# Patient Record
Sex: Male | Born: 1950 | Race: White | Hispanic: Refuse to answer | Marital: Married | Smoking: Current every day smoker
Health system: Southern US, Community
[De-identification: ages and names within clinical notes are randomized; demographics above are authoritative.]

## PROBLEM LIST (undated history)

## (undated) DIAGNOSIS — M79609 Pain in unspecified limb: Secondary | ICD-10-CM

## (undated) DIAGNOSIS — R739 Hyperglycemia, unspecified: Secondary | ICD-10-CM

## (undated) DIAGNOSIS — N200 Calculus of kidney: Secondary | ICD-10-CM

## (undated) DIAGNOSIS — E119 Type 2 diabetes mellitus without complications: Secondary | ICD-10-CM

## (undated) DIAGNOSIS — I10 Essential (primary) hypertension: Secondary | ICD-10-CM

## (undated) DIAGNOSIS — R030 Elevated blood-pressure reading, without diagnosis of hypertension: Secondary | ICD-10-CM

## (undated) DIAGNOSIS — M1990 Unspecified osteoarthritis, unspecified site: Secondary | ICD-10-CM

## (undated) DIAGNOSIS — M87051 Idiopathic aseptic necrosis of right femur: Secondary | ICD-10-CM

## (undated) DIAGNOSIS — B019 Varicella without complication: Secondary | ICD-10-CM

## (undated) DIAGNOSIS — E785 Hyperlipidemia, unspecified: Secondary | ICD-10-CM

## (undated) DIAGNOSIS — Z96641 Presence of right artificial hip joint: Secondary | ICD-10-CM

## (undated) HISTORY — DX: Calculus of kidney: N20.0

## (undated) HISTORY — DX: Elevated blood-pressure reading, without diagnosis of hypertension: R03.0

## (undated) HISTORY — DX: Type 2 diabetes mellitus without complications: E11.9

## (undated) HISTORY — DX: Presence of right artificial hip joint: Z96.641

## (undated) HISTORY — DX: Idiopathic aseptic necrosis of right femur: M87.051

## (undated) HISTORY — DX: Hyperglycemia, unspecified: R73.9

## (undated) HISTORY — DX: Pain in unspecified limb: M79.609

## (undated) HISTORY — DX: Hyperlipidemia, unspecified: E78.5

## (undated) HISTORY — DX: Essential (primary) hypertension: I10

## (undated) HISTORY — DX: Varicella without complication: B01.9

---

## 1998-12-29 ENCOUNTER — Encounter: Payer: Self-pay | Admitting: *Deleted

## 1998-12-29 ENCOUNTER — Emergency Department (HOSPITAL_COMMUNITY): Admission: EM | Admit: 1998-12-29 | Discharge: 1998-12-29 | Payer: Self-pay | Admitting: *Deleted

## 2012-05-20 ENCOUNTER — Encounter: Payer: Self-pay | Admitting: Family Medicine

## 2012-05-20 ENCOUNTER — Ambulatory Visit (INDEPENDENT_AMBULATORY_CARE_PROVIDER_SITE_OTHER): Payer: 59 | Admitting: Family Medicine

## 2012-05-20 VITALS — BP 142/80 | HR 97 | Temp 98.6°F | Ht 69.0 in | Wt 203.0 lb

## 2012-05-20 DIAGNOSIS — F529 Unspecified sexual dysfunction not due to a substance or known physiological condition: Secondary | ICD-10-CM

## 2012-05-20 DIAGNOSIS — Z1211 Encounter for screening for malignant neoplasm of colon: Secondary | ICD-10-CM

## 2012-05-20 DIAGNOSIS — Z13220 Encounter for screening for lipoid disorders: Secondary | ICD-10-CM

## 2012-05-20 DIAGNOSIS — R37 Sexual dysfunction, unspecified: Secondary | ICD-10-CM

## 2012-05-20 DIAGNOSIS — R030 Elevated blood-pressure reading, without diagnosis of hypertension: Secondary | ICD-10-CM

## 2012-05-20 DIAGNOSIS — Z131 Encounter for screening for diabetes mellitus: Secondary | ICD-10-CM

## 2012-05-20 NOTE — Progress Notes (Signed)
Chief Complaint  Patient presents with  . Establish Care    HPI:  Adam Perkins is here to establish care. Has not had a PCP in a long time. Recently wife has suggested he get established with a primary care doctor. Last PCP and physical: has been a long time since seen Work: starting a company - food business Home Situation: lives with wife Spiritual Beliefs:  Exercise/Diet: bunion on foot inhibiting walking/working on diet    Has the following chronic problems and concerns today:  Sexual dysfunction: -started after wife's mother in-law passed away - lots of stress and wife had no desire for some time -libido seems good, erections sometimes great, erections good other times or sometimes not good when stressed -issues with wife in terms of psychological issues after above event -has a lot of stress at home, wife wants him to have testosterone checked  Other Providers: -Dr. Darel Hong, optho, has a little elevated ocular pressure   Health Maintenance: -colon: wants colonoscopy -prostate cancer screening: refused after discussion, will think about it -Vaccines: refused  ROS: See pertinent positives and negatives per HPI.   Past Medical History  Diagnosis Date  . Chicken pox   . Kidney stone     nephrolithiasis    Family History  Problem Relation Age of Onset  . Arthritis Father   . Cancer Father 33    bladder cancer  . Diabetes Mother     History   Social History  . Marital Status: Married    Spouse Name: N/A    Number of Children: N/A  . Years of Education: N/A   Social History Main Topics  . Smoking status: Former Smoker    Quit date: 05/21/2011  . Smokeless tobacco: None     Comment: quit 2012 after smoking on and off a little for years  . Alcohol Use: Yes     Comment: under safe drinking limits  . Drug Use: None  . Sexually Active: None   Other Topics Concern  . None   Social History Narrative  . None    Current outpatient prescriptions:timolol  (BETIMOL) 0.25 % ophthalmic solution, Place 1 drop into both eyes 2 (two) times daily., Disp: , Rfl:   EXAM:  Filed Vitals:   05/20/12 1425  BP: 142/80  Pulse: 97  Temp: 98.6 F (37 C)   BP great on recheck 128/78 Body mass index is 29.98 kg/(m^2).  GENERAL: vitals reviewed and listed above, alert, oriented, appears well hydrated and in no acute distress  HEENT: atraumatic, conjunttiva clear, no obvious abnormalities on inspection of external nose and ears  NECK: no obvious masses on inspection  LUNGS: clear to auscultation bilaterally, no wheezes, rales or rhonchi, good air movement  CV: HRRR, no peripheral edema  MS: moves all extremities without noticeable abnormality  PSYCH: pleasant and cooperative, no obvious depression or anxiety  ASSESSMENT AND PLAN:  Discussed the following assessment and plan:  1. Sexual dysfunction  Testosterone, free, total  2. Screening for diabetes mellitus  Hemoglobin A1c  3. Screening for hyperlipidemia  Lipid Panel  4. Colon cancer screening  Ambulatory referral to Gastroenterology  5. Elevated blood pressure  Basic metabolic panel   -We reviewed the PMH, PSH, FH, SH, Meds and Allergies. -We provided refills for any medications we will prescribe as needed. -We addressed current concerns per orders and patient instructions. -We have asked for records for pertinent exams, studies, vaccines and notes from previous providers. -We have advised patient to  follow up per instructions below. -Influenza vaccine given today  -Patient advised to return or notify a doctor immediately if symptoms worsen or persist or new concerns arise.  Patient Instructions  -We have ordered labs or studies at this visit. It can take up to 1-2 weeks for results and processing. We will contact you with instructions IF your results are abnormal. Normal results will be released to your [facility]. If you have not heard from [place] or can not find your results in [facility] in 2  weeks please contact our office.  -PLEASE SIGN UP FOR MYCHART TODAY   We recommend the following healthy lifestyle measures: - eat a healthy diet consisting of lots of vegetables, fruits, beans, nuts, seeds, healthy meats such as white chicken and fish and whole grains.  - avoid fried foods, fast food, processed foods, sodas, red meet and other fattening foods.  - get a least 150 minutes of aerobic exercise per week.   Follow up in: 3 months      Louine Tenpenny R.

## 2012-05-20 NOTE — Patient Instructions (Addendum)
-  We have ordered labs or studies at this visit. It can take up to 1-2 weeks for results and processing. We will contact you with instructions IF your results are abnormal. Normal results will be released to your MYCHART. If you have not heard from us or can not find your results in MYCHART in 2 weeks please contact our office.  -PLEASE SIGN UP FOR MYCHART TODAY   We recommend the following healthy lifestyle measures: - eat a healthy diet consisting of lots of vegetables, fruits, beans, nuts, seeds, healthy meats such as white chicken and fish and whole grains.  - avoid fried foods, fast food, processed foods, sodas, red meet and other fattening foods.  - get a least 150 minutes of aerobic exercise per week.   Follow up in: 3 months  

## 2012-06-07 ENCOUNTER — Other Ambulatory Visit: Payer: 59

## 2012-06-07 DIAGNOSIS — Z13220 Encounter for screening for lipoid disorders: Secondary | ICD-10-CM

## 2012-06-07 DIAGNOSIS — R37 Sexual dysfunction, unspecified: Secondary | ICD-10-CM

## 2012-06-07 DIAGNOSIS — Z131 Encounter for screening for diabetes mellitus: Secondary | ICD-10-CM

## 2012-06-07 LAB — LIPID PANEL
Cholesterol: 275 mg/dL — ABNORMAL HIGH (ref 0–200)
HDL: 43.3 mg/dL (ref >39.00)
Total CHOL/HDL Ratio: 6
Triglycerides: 186 mg/dL — ABNORMAL HIGH (ref 0.0–149.0)
VLDL: 37.2 mg/dL (ref 0.0–40.0)

## 2012-06-07 LAB — BASIC METABOLIC PANEL
BUN: 16 mg/dL (ref 6–23)
CO2: 24 mEq/L (ref 19–32)
Calcium: 9.1 mg/dL (ref 8.4–10.5)
Chloride: 103 mEq/L (ref 96–112)
Creatinine, Ser: 0.8 mg/dL (ref 0.4–1.5)
GFR: 105.87 mL/min (ref >60.00)
Glucose, Bld: 142 mg/dL — ABNORMAL HIGH (ref 70–99)
Potassium: 3.9 mEq/L (ref 3.5–5.1)
Sodium: 137 mEq/L (ref 135–145)

## 2012-06-07 LAB — HEMOGLOBIN A1C: Hgb A1c MFr Bld: 6.5 % (ref 4.6–6.5)

## 2012-06-07 LAB — LDL CHOLESTEROL, DIRECT: Direct LDL: 210.2 mg/dL

## 2012-06-10 ENCOUNTER — Telehealth: Payer: Self-pay | Admitting: Family Medicine

## 2012-06-10 LAB — TESTOSTERONE, FREE, TOTAL, SHBG
Sex Hormone Binding: 24 nmol/L (ref 13–71)
Testosterone, Free: 77.2 pg/mL (ref 47.0–244.0)
Testosterone-% Free: 2.4 % (ref 1.6–2.9)
Testosterone: 327.28 ng/dL (ref 300–890)

## 2012-06-10 NOTE — Telephone Encounter (Signed)
Please let patient know, since not yet signed up for mychart, wanted to contact regarding lab results. Recommend signing up for mychart to see these results in about 10 days.  -cholesterol and diabetes screening labs elevated  The best treatment to hopefully reverse these findings and prevent adverse health outcomes is a healthy diet and regular exercise.   Needs appointment with me in 1-2 months to recheck and discuss treatment options.

## 2012-06-11 NOTE — Telephone Encounter (Signed)
Left a message for pt to return call 

## 2012-06-13 NOTE — Telephone Encounter (Signed)
Patient is aware of lab results and an appointment made. 

## 2012-08-19 ENCOUNTER — Ambulatory Visit (INDEPENDENT_AMBULATORY_CARE_PROVIDER_SITE_OTHER): Payer: 59 | Admitting: Family Medicine

## 2012-08-19 ENCOUNTER — Encounter: Payer: Self-pay | Admitting: Family Medicine

## 2012-08-19 VITALS — BP 168/98 | HR 68 | Temp 98.1°F | Wt 201.0 lb

## 2012-08-19 DIAGNOSIS — E785 Hyperlipidemia, unspecified: Secondary | ICD-10-CM

## 2012-08-19 DIAGNOSIS — E119 Type 2 diabetes mellitus without complications: Secondary | ICD-10-CM

## 2012-08-19 DIAGNOSIS — IMO0001 Reserved for inherently not codable concepts without codable children: Secondary | ICD-10-CM

## 2012-08-19 DIAGNOSIS — I10 Essential (primary) hypertension: Secondary | ICD-10-CM | POA: Insufficient documentation

## 2012-08-19 DIAGNOSIS — R030 Elevated blood-pressure reading, without diagnosis of hypertension: Secondary | ICD-10-CM

## 2012-08-19 HISTORY — DX: Reserved for inherently not codable concepts without codable children: IMO0001

## 2012-08-19 HISTORY — DX: Hyperlipidemia, unspecified: E78.5

## 2012-08-19 LAB — HEMOGLOBIN A1C: Hgb A1c MFr Bld: 6.1 % (ref 4.6–6.5)

## 2012-08-19 LAB — LIPID PANEL
Cholesterol: 250 mg/dL — ABNORMAL HIGH (ref 0–200)
HDL: 39.3 mg/dL (ref >39.00)
Total CHOL/HDL Ratio: 6
Triglycerides: 152 mg/dL — ABNORMAL HIGH (ref 0.0–149.0)
VLDL: 30.4 mg/dL (ref 0.0–40.0)

## 2012-08-19 NOTE — Progress Notes (Signed)
Chief Complaint  Patient presents with  . Follow-up    HPI:  Diabetes: -new dx on labs 05/2012  Lab Results  Component Value Date   HGBA1C 6.5 06/07/2012  -advised lifestyle changes and close follow up -diet and exercise: has made some changes with his diet - cutting at fats in diet and eating much healthier, trying to do some exercise  HLD: Lab Results  Component Value Date   CHOL 275* 06/07/2012   HDL 43.30 06/07/2012   LDLDIRECT 210.2 06/07/2012   TRIG 186.0* 06/07/2012   CHOLHDL 6 06/07/2012    ROS: See pertinent positives and negatives per HPI.  Past Medical History  Diagnosis Date  . Chicken pox   . Kidney stone     nephrolithiasis    Family History  Problem Relation Age of Onset  . Arthritis Father   . Cancer Father 68    bladder cancer  . Diabetes Mother     History   Social History  . Marital Status: Married    Spouse Name: N/A    Number of Children: N/A  . Years of Education: N/A   Social History Main Topics  . Smoking status: Former Smoker    Quit date: 05/21/2011  . Smokeless tobacco: None     Comment: quit 2012 after smoking on and off a little for years  . Alcohol Use: Yes     Comment: under safe drinking limits  . Drug Use: None  . Sexually Active: None   Other Topics Concern  . None   Social History Narrative  . None    Current outpatient prescriptions:timolol (BETIMOL) 0.25 % ophthalmic solution, Place 1 drop into both eyes 2 (two) times daily., Disp: , Rfl:   EXAM:  Filed Vitals:   08/19/12 1029  BP: 168/98  Pulse: 68  Temp: 98.1 F (36.7 C)    Body mass index is 29.67 kg/(m^2).  GENERAL: vitals reviewed and listed above, alert, oriented, appears well hydrated and in no acute distress  HEENT: atraumatic, conjunttiva clear, no obvious abnormalities on inspection of external nose and ears  NECK: no obvious masses on inspection  LUNGS: clear to auscultation bilaterally, no wheezes, rales or rhonchi, good air  movement  CV: HRRR, no peripheral edema  MS: moves all extremities without noticeable abnormality  PSYCH: pleasant and cooperative, no obvious depression or anxiety  ASSESSMENT AND PLAN:  Discussed the following assessment and plan:  Diabetes - Plan: Hemoglobin A1c  Hyperlipemia - Plan: Lipid Panel  Elevated blood pressure  -discussed options for management diabetes and HLD -will recheck labs - if needed start metformin (risks discussed) at low dose - does not want to start medication at this time -pt provided with meter and instructed on use -offered referral to diabetes educator - he does not want this today -advised diet and exercise - he is going to work on this -advised adding statin - risks discussed -BP 136/88 on recheck - advised to bring cuff and home log next appointment -follow up in 3 months -Patient advised to return or notify a doctor immediately if symptoms worsen or persist or new concerns arise.   Kriste Basque R.

## 2012-08-20 ENCOUNTER — Telehealth: Payer: Self-pay | Admitting: Family Medicine

## 2012-08-20 DIAGNOSIS — E785 Hyperlipidemia, unspecified: Secondary | ICD-10-CM

## 2012-08-20 MED ORDER — PRAVASTATIN SODIUM 40 MG PO TABS: 40.0000 mg | ORAL_TABLET | Freq: Every day | ORAL | Status: DC

## 2012-08-20 NOTE — Telephone Encounter (Signed)
Please let him know:  Labs have improved. I do not think we need a medication for the diabetes at this time as long as he keeps up the good work with eating healthy and exercising.  Cholesterol is improved a little, but still very high and I would recommend starting the pravastatin as we discussed. Sent Rx to the [facility]. This is to be taken once daily. Advise appointment in 3 months and will recheck then - please help him schedule this.

## 2012-08-21 NOTE — Telephone Encounter (Signed)
Called and spoke with pt and pt is aware.  

## 2012-08-21 NOTE — Telephone Encounter (Signed)
Left a message for pt to return call 

## 2012-11-21 ENCOUNTER — Encounter: Payer: Self-pay | Admitting: Family Medicine

## 2012-11-21 ENCOUNTER — Ambulatory Visit (INDEPENDENT_AMBULATORY_CARE_PROVIDER_SITE_OTHER): Payer: 59 | Admitting: Family Medicine

## 2012-11-21 VITALS — BP 140/80 | Temp 98.2°F | Wt 196.0 lb

## 2012-11-21 DIAGNOSIS — E119 Type 2 diabetes mellitus without complications: Secondary | ICD-10-CM

## 2012-11-21 DIAGNOSIS — M5430 Sciatica, unspecified side: Secondary | ICD-10-CM

## 2012-11-21 DIAGNOSIS — M25551 Pain in right hip: Secondary | ICD-10-CM

## 2012-11-21 DIAGNOSIS — R030 Elevated blood-pressure reading, without diagnosis of hypertension: Secondary | ICD-10-CM

## 2012-11-21 DIAGNOSIS — E785 Hyperlipidemia, unspecified: Secondary | ICD-10-CM

## 2012-11-21 DIAGNOSIS — M5431 Sciatica, right side: Secondary | ICD-10-CM

## 2012-11-21 DIAGNOSIS — M25559 Pain in unspecified hip: Secondary | ICD-10-CM

## 2012-11-21 MED ORDER — PRAVASTATIN SODIUM 40 MG PO TABS: 40.0000 mg | ORAL_TABLET | Freq: Every day | ORAL | Status: DC

## 2012-11-21 NOTE — Progress Notes (Signed)
Chief Complaint  Patient presents with  . Follow-up    cholesterol     HPI:  ROS: See pertinent positives and negatives per HPI.  Follow up:  1) Diabetes - diet controlled -lifestyle: walking, lifting and stretching daily -feels good Lab Results  Component Value Date   HGBA1C 6.1 08/19/2012   2) HLD: Lab Results  Component Value Date   CHOL 250* 08/19/2012   HDL 39.30 08/19/2012   LDLDIRECT 192.4 08/19/2012   TRIG 152.0* 08/19/2012   CHOLHDL 6 08/19/2012  -did not start statin - wanted to discuss first worried about leg pains with statins  3)Hx elevated BP: -tx with lifestyle changes, advised to bring cuff and home log this appt -denies:  4)on going intermittent R hip pain -on and off for many years -told sciatica and bursitis in the past -wants to see specialist -denies: weakness, numbness, bowel - bladder function   Past Medical History  Diagnosis Date  . Chicken pox   . Kidney stone     nephrolithiasis  . Diabetes 08/19/2012  . Hyperlipemia 08/19/2012  . Elevated blood pressure 08/19/2012    Family History  Problem Relation Age of Onset  . Arthritis Father   . Cancer Father 22    bladder cancer  . Diabetes Mother     History   Social History  . Marital Status: Married    Spouse Name: N/A    Number of Children: N/A  . Years of Education: N/A   Social History Main Topics  . Smoking status: Former Smoker    Quit date: 05/21/2011  . Smokeless tobacco: None     Comment: quit 2012 after smoking on and off a little for years  . Alcohol Use: Yes     Comment: under safe drinking limits  . Drug Use: None  . Sexually Active: None   Other Topics Concern  . None   Social History Narrative  . None    Current outpatient prescriptions:timolol (BETIMOL) 0.25 % ophthalmic solution, Place 1 drop into both eyes 2 (two) times daily., Disp: , Rfl: ;  pravastatin (PRAVACHOL) 40 MG tablet, Take 1 tablet (40 mg total) by mouth daily., Disp: 90 tablet, Rfl:  3  EXAM:  Filed Vitals:   11/21/12 1004  BP: 140/80  Temp: 98.2 F (36.8 C)    Body mass index is 28.93 kg/(m^2).  GENERAL: vitals reviewed and listed above, alert, oriented, appears well hydrated and in no acute distress  HEENT: atraumatic, conjunttiva clear, no obvious abnormalities on inspection of external nose and ears  NECK: no obvious masses on inspection  LUNGS: clear to auscultation bilaterally, no wheezes, rales or rhonchi, good air movement  CV: HRRR, no peripheral edema  MS: moves all extremities without noticeable abnormality  PSYCH: pleasant and cooperative, no obvious depression or anxiety  ASSESSMENT AND PLAN:  Discussed the following assessment and plan:  Diabetes  Hyperlipemia - Plan: pravastatin (PRAVACHOL) 40 MG tablet  Elevated blood pressure -LABS today -follow up in 3-5 months for CPE -Patient advised to return or notify a doctor immediately if symptoms worsen or persist or new concerns arise.  There are no Patient Instructions on file for this visit.   Kriste Basque R.

## 2012-11-21 NOTE — Patient Instructions (Signed)
-  start the pravastatin -As we discussed, we have prescribed a new medication (pravastatin) for you at this appointment. We discussed the common and serious potential adverse effects of this medication and you can review these and more with the pharmacist when you pick up your medication.  Please follow the instructions for use carefully and notify us immediately if you have any problems taking this medication.  -continue to work on diet and exercise  -home exercises for your hip  -We placed a referral for you as discussed. It usually takes about 1-2 weeks to process and schedule this referral. If you have not heard from [place] regarding this appointment in 2 weeks please contact our office.   -follow up for early morning appointment in 3 months and will check labs then

## 2012-12-04 ENCOUNTER — Encounter: Payer: Self-pay | Admitting: Physical Medicine & Rehabilitation

## 2012-12-13 ENCOUNTER — Encounter: Payer: Self-pay | Admitting: Physical Medicine & Rehabilitation

## 2012-12-13 ENCOUNTER — Encounter: Payer: 59 | Attending: Physical Medicine & Rehabilitation

## 2012-12-13 ENCOUNTER — Ambulatory Visit (HOSPITAL_BASED_OUTPATIENT_CLINIC_OR_DEPARTMENT_OTHER): Payer: 59 | Admitting: Physical Medicine & Rehabilitation

## 2012-12-13 VITALS — BP 162/96 | HR 60 | Resp 14 | Ht 69.0 in | Wt 196.0 lb

## 2012-12-13 DIAGNOSIS — M76899 Other specified enthesopathies of unspecified lower limb, excluding foot: Secondary | ICD-10-CM

## 2012-12-13 DIAGNOSIS — M7061 Trochanteric bursitis, right hip: Secondary | ICD-10-CM

## 2012-12-13 DIAGNOSIS — E785 Hyperlipidemia, unspecified: Secondary | ICD-10-CM | POA: Insufficient documentation

## 2012-12-13 DIAGNOSIS — G5700 Lesion of sciatic nerve, unspecified lower limb: Secondary | ICD-10-CM | POA: Insufficient documentation

## 2012-12-13 DIAGNOSIS — E119 Type 2 diabetes mellitus without complications: Secondary | ICD-10-CM | POA: Insufficient documentation

## 2012-12-13 DIAGNOSIS — G5701 Lesion of sciatic nerve, right lower limb: Secondary | ICD-10-CM

## 2012-12-13 MED ORDER — IBUPROFEN 800 MG PO TABS: 800.0000 mg | ORAL_TABLET | Freq: Three times a day (TID) | ORAL | Status: DC | PRN

## 2012-12-13 NOTE — Progress Notes (Signed)
Subjective:    Patient ID: Adam Perkins, male    DOB: [birth year], 62 y.o.   MRN: [id number]  HPI CC R hip and R sciatic pain This has been going on in off for many months. It worsened since the winter of 2014. R hip worsens with prolonged walking Sciatic type pain increased with lifting and ladder use, the sciatic pain is described as originating from the buttocks and radiating into the thigh on the right side  No history of trauma. No history of back surgery. No history of joint replacement surgery. Has tried over-the-counter medications when he gets "worse". No trial injections or physical therapy  Semi retired Pain Inventory Average Pain 6 Pain Right Now 4 My pain is aching  In the last 24 hours, has pain interfered with the following? General activity 6 Relation with others 0 Enjoyment of life 5 What TIME of day is your pain at its worst? evening Sleep (in general) Fair  Pain is worse with: walking and some activites Pain improves with: unsure Relief from Meds: no pain med  Mobility walk without assistance ability to climb steps?  yes do you drive?  yes  Function employed # of hrs/week .  Neuro/Psych No problems in this area  Prior Studies Any changes since last visit?  no  Physicians involved in your care Any changes since last visit?  no   Family History  Problem Relation Age of Onset  . Arthritis Father   . Cancer Father 38    bladder cancer  . Diabetes Mother    History   Social History  . Marital Status: Married    Spouse Name: N/A    Number of Children: N/A  . Years of Education: N/A   Social History Main Topics  . Smoking status: Former Smoker    Quit date: 05/21/2011  . Smokeless tobacco: Never Used     Comment: quit 2012 after smoking on and off a little for years  . Alcohol Use: Yes     Comment: under safe drinking limits  . Drug Use: None  . Sexually Active: None   Other Topics Concern  . None   Social History Narrative  .  None   History reviewed. No pertinent past surgical history. Past Medical History  Diagnosis Date  . Chicken pox   . Kidney stone     nephrolithiasis  . Diabetes 08/19/2012  . Hyperlipemia 08/19/2012  . Elevated blood pressure 08/19/2012   BP 162/96  Pulse 60  Resp 14  Ht 5\' 9"  (1.753 m)  Wt 196 lb (88.905 kg)  BMI 28.93 kg/m2  SpO2 99%    Review of Systems  Musculoskeletal: Positive for gait problem.       Hip pain and sciatica  All other systems reviewed and are negative.       Objective:   Physical Exam  Nursing note and vitals reviewed. Constitutional: He is oriented to person, place, and time. He appears well-developed and well-nourished.  HENT:  Head: Normocephalic and atraumatic.  Eyes: Conjunctivae and EOM are normal.  Neck: Normal range of motion.  Musculoskeletal:       Right hip: He exhibits decreased range of motion and tenderness.  Neurological: He is alert and oriented to person, place, and time. He has normal reflexes.  Psychiatric: He has a normal mood and affect.   Negative straight leg raising test Negative femoral stretch test Tenderness over the right gluteus area Tenderness over the right greater trochanter of the  hip Right lateral hip pain with internal rotation passively of the right hip. Ambulation is normal       Assessment & Plan:  1. Right trochanteric bursitis 2. Right piriformis syndrome  Will refer to physical therapy to work on stretching and strengthening the external rotators of the hip as well as the tensor fascia lata  Will start patient on ibuprofen 800 3 times a day x2 weeks  If patient fails to improve after the above regimen, I will see him back in followup and perform trochanteric bursa injection, may need hip ultrasound and hip films as well.

## 2012-12-13 NOTE — Patient Instructions (Signed)
Take ibuprofen with food.

## 2013-01-13 ENCOUNTER — Other Ambulatory Visit: Payer: Self-pay

## 2013-01-13 MED ORDER — IBUPROFEN 800 MG PO TABS: 800.0000 mg | ORAL_TABLET | Freq: Three times a day (TID) | ORAL | Status: DC | PRN

## 2013-02-21 ENCOUNTER — Ambulatory Visit: Payer: 59 | Admitting: Family Medicine

## 2013-05-20 ENCOUNTER — Ambulatory Visit: Payer: Self-pay

## 2013-06-03 ENCOUNTER — Ambulatory Visit (INDEPENDENT_AMBULATORY_CARE_PROVIDER_SITE_OTHER): Payer: 59

## 2013-06-03 VITALS — BP 163/76 | HR 86 | Resp 12 | Ht 69.0 in | Wt 192.0 lb

## 2013-06-03 DIAGNOSIS — B351 Tinea unguium: Secondary | ICD-10-CM

## 2013-06-03 DIAGNOSIS — M2020 Hallux rigidus, unspecified foot: Secondary | ICD-10-CM

## 2013-06-03 DIAGNOSIS — R52 Pain, unspecified: Secondary | ICD-10-CM

## 2013-06-03 DIAGNOSIS — B353 Tinea pedis: Secondary | ICD-10-CM

## 2013-06-03 DIAGNOSIS — M1990 Unspecified osteoarthritis, unspecified site: Secondary | ICD-10-CM

## 2013-06-03 DIAGNOSIS — M2021 Hallux rigidus, right foot: Secondary | ICD-10-CM

## 2013-06-03 MED ORDER — NAFTIFINE HCL 2 % EX CREA: TOPICAL_CREAM | CUTANEOUS | Status: DC

## 2013-06-03 NOTE — Patient Instructions (Signed)
Onychomycosis/Fungal Toenails  WHAT IS IT? An infection that lies within the keratin of your nail plate that is caused by a fungus.  WHY ME? Fungal infections affect all ages, sexes, races, and creeds.  There may be many factors that predispose you to a fungal infection such as age, coexisting medical conditions such as diabetes, or an autoimmune disease; stress, medications, fatigue, genetics, etc.  Bottom line: fungus thrives in a warm, moist environment and your shoes offer such a location.  IS IT CONTAGIOUS? Theoretically, yes.  You do not want to share shoes, nail clippers or files with someone who has fungal toenails.  Walking around barefoot in the same room or sleeping in the same bed is unlikely to transfer the organism.  It is important to realize, however, that fungus can spread easily from one nail to the next on the same foot.  HOW DO WE TREAT THIS?  There are several ways to treat this condition.  Treatment may depend on many factors such as age, medications, pregnancy, liver and kidney conditions, etc.  It is best to ask your doctor which options are available to you.  1. No treatment.   Unlike many other medical concerns, you can live with this condition.  However for many people this can be a painful condition and may lead to ingrown toenails or a bacterial infection.  It is recommended that you keep the nails cut short to help reduce the amount of fungal nail. 2. Topical treatment.  These range from herbal remedies to prescription strength nail lacquers.  About 40-50% effective, topicals require twice daily application for approximately 9 to 12 months or until an entirely new nail has grown out.  The most effective topicals are medical grade medications available through physicians offices. 3. Oral antifungal medications.  With an 80-90% cure rate, the most common oral medication requires 3 to 4 months of therapy and stays in your system for a year as the new nail grows out.  Oral  antifungal medications do require blood work to make sure it is a safe drug for you.  A liver function panel will be performed prior to starting the medication and after the first month of treatment.  It is important to have the blood work performed to avoid any harmful side effects.  In general, this medication safe but blood work is required. 4. Laser Therapy.  This treatment is performed by applying a specialized laser to the affected nail plate.  This therapy is noninvasive, fast, and non-painful.  It is not covered by insurance and is therefore, out of pocket.  The results have been very good with a 80-95% cure rate.  The [facility] is the only practice in the area to offer this therapy. 5. Permanent Nail Avulsion.  Removing the entire nail so that a new nail will not grow back. 6. Obtain formula 3 at the office applied twice daily to affected nails for 6-12 months as instructed

## 2013-06-03 NOTE — Progress Notes (Signed)
Subjective:    Patient ID: Adam Perkins, male    DOB: [birth year], 62 y.o.   MRN: [id number]  HPI Comments: '' THE TOENAILS HAVE DISCOLORATION AND SORE. THIS BEEN GOING ON FOR AT LEAST 2 YEARS AND TRIED TOPICAL MEDICATION, BUT NOTHING CHANGE.''  Foot Pain This is a new problem. The current episode started more than 1 month ago. Episode frequency: COMES AND GO. The problem has been unchanged. Associated symptoms include joint swelling. The symptoms are aggravated by walking and standing. He has tried nothing for the symptoms. The treatment provided no relief.      Review of Systems  Musculoskeletal: Positive for joint swelling.  All other systems reviewed and are negative.       Objective:   Physical Exam  Vitals reviewed. Constitutional: He is oriented to person, place, and time. He appears well-developed and well-nourished.  Cardiovascular:  Pulses:      Dorsalis pedis pulses are 2+ on the right side, and 2+ on the left side.       Posterior tibial pulses are 2+ on the right side, and 2+ on the left side.  Capillary fill time 3 seconds all digits. Skin temperature warm. Turgor normal no edema rubor pallor or varicosities noted.  Musculoskeletal:  Orthopedic biomechanical exam reveals prominence enlargement dorsal eminence of the first MTP area right foot is posterior left. Clinically there is reduced range of motion dorsiflexion on the right first MTP area x-rays did thick large dorsal osteophyte asymmetric joint space narrowing of the first MTP joint with loss of joint space there is pain and crepitus on attempted range of motion and palpation of the first MTP joint right left is asymptomatic.  Neurological: He is alert and oriented to person, place, and time. He has normal strength and normal reflexes.  Epicritic and proprioceptive sensations are intact and symmetric bilateral. There is normal plantar response and DTRs noted.  Skin: Skin is warm and dry. No cyanosis. Nails show  no clubbing.  Neurologic the skin color pigment normal hair growth absent nails extremely thick yellow brittle discolored friable more significant the hallux nails bilateral second third and fifth nails show thickening discoloration and friability and incurvation. Patient has tried attempted treatment these in the past with little or no success tried some startup antifungal with no improvement several years back osseous purchase 10 years ago  Psychiatric: He has a normal mood and affect. His behavior is normal.          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 Patient has significant loss of motion suggested staying in a stiff soled shoe Boyd flimsy shoes flip-flops are going barefoot. Patient given literature about osteoarthritis arthropathy affecting the foot a be candidate at some point for surgical intervention possibly cheilectomy and implant arthroplasty may be considered. Fusion would be a last option her choice I do feel there is still some cartilage possibly intact although minimal suggested over-the-counter Advil or trophic or Tylenol or NSAID if needed for pain and again maintained a stiff soled accommodative shoe at all times  Assessment number 2: Onychomycosis and chronic tinea pedis affecting both feet. Thick brittle crumbly friable criptotic nails can be treated with topical antifungal therapy did recommend topical formula 3 which can be obtained here at the office applied twice daily to the affected nails for 6-12 months or longer duration. Patient is also advised a prescription for Naftin  cream to be applied twice daily to the affected skin areas in a moccasin distribution of both feet. Patient is dry scaling fissured skin with an erythematous plaque and consistent with a chronic tinea pedis. Patient advised the nail treatment will take at least 6 months to 12 month to see evidence of  improvement  Suggested followup in the next 6 months for the nails to possibly do a reassessment. I do feel patient is a good candidate for surgical intervention for a arthritic joint likely joint implant is can we recommended  Alvan Dame DPM

## 2013-07-14 ENCOUNTER — Encounter: Payer: Self-pay | Admitting: Family Medicine

## 2013-07-14 ENCOUNTER — Ambulatory Visit (INDEPENDENT_AMBULATORY_CARE_PROVIDER_SITE_OTHER): Payer: 59 | Admitting: Family Medicine

## 2013-07-14 VITALS — BP 102/76 | Temp 98.1°F | Wt 198.0 lb

## 2013-07-14 DIAGNOSIS — E119 Type 2 diabetes mellitus without complications: Secondary | ICD-10-CM

## 2013-07-14 DIAGNOSIS — Z23 Encounter for immunization: Secondary | ICD-10-CM

## 2013-07-14 DIAGNOSIS — L0293 Carbuncle, unspecified: Secondary | ICD-10-CM

## 2013-07-14 DIAGNOSIS — E785 Hyperlipidemia, unspecified: Secondary | ICD-10-CM

## 2013-07-14 DIAGNOSIS — L0292 Furuncle, unspecified: Secondary | ICD-10-CM

## 2013-07-14 MED ORDER — DOXYCYCLINE HYCLATE 100 MG PO TABS: 100.0000 mg | ORAL_TABLET | Freq: Two times a day (BID) | ORAL | Status: DC

## 2013-07-14 NOTE — Progress Notes (Signed)
Chief Complaint  Patient presents with  . Hernia    lump in groin area noticed on Thursday; bothersome at times     HPI:  Acute visit for:  1) Lump in groin: -noticed a few days ago in shower -maybe a little tender -denies: fevers, dysuria, blood in urine or other symptoms, malaise, weight loss  Follow up issues: -needs follow up chronic disease and CPE with labs and to review health maintenance -flu vaccine:  Wants tdap today   ROS: See pertinent positives and negatives per HPI.  Past Medical History  Diagnosis Date  . Chicken pox   . Kidney stone     nephrolithiasis  . Diabetes 08/19/2012  . Hyperlipemia 08/19/2012  . Elevated blood pressure 08/19/2012    No past surgical history on file.  Family History  Problem Relation Age of Onset  . Arthritis Father   . Cancer Father 75    bladder cancer  . Diabetes Mother     History   Social History  . Marital Status: Married    Spouse Name: N/A    Number of Children: N/A  . Years of Education: N/A   Social History Main Topics  . Smoking status: Former Smoker    Quit date: 05/21/2011  . Smokeless tobacco: Never Used     Comment: quit 2012 after smoking on and off a little for years  . Alcohol Use: Yes     Comment: under safe drinking limits  . Drug Use: None  . Sexual Activity: None   Other Topics Concern  . None   Social History Narrative  . None    Current outpatient prescriptions:ibuprofen (ADVIL,MOTRIN) 800 MG tablet, Take 1 tablet (800 mg total) by mouth every 8 (eight) hours as needed for pain., Disp: 45 tablet, Rfl: 0;  Naftifine HCl (NAFTIN) 2 % CREA, Apply to affected areas of both feet twice daily as instructed, Disp: 60 g, Rfl: 4;  timolol (BETIMOL) 0.25 % ophthalmic solution, Place 1 drop into both eyes 2 (two) times daily., Disp: , Rfl:  doxycycline (VIBRA-TABS) 100 MG tablet, Take 1 tablet (100 mg total) by mouth 2 (two) times daily., Disp: 20 tablet, Rfl: 0  EXAM:  Filed Vitals:   07/14/13  1339  BP: 102/76  Temp: 98.1 F (36.7 C)    Body mass index is 29.23 kg/(m^2).  GENERAL: vitals reviewed and listed above, alert, oriented, appears well hydrated and in no acute distress  HEENT: atraumatic, conjunttiva clear, no obvious abnormalities on inspection of external nose and ears  NECK: no obvious masses on inspection  LUNGS: clear to auscultation bilaterally, no wheezes, rales or rhonchi, good air movement  CV: HRRR, no peripheral edema  SKIN: boil suprapubic region  MS: moves all extremities without noticeable abnormality  PSYCH: pleasant and cooperative, no obvious depression or anxiety  ASSESSMENT AND PLAN:  Discussed the following assessment and plan:  Boil - Plan: doxycycline (VIBRA-TABS) 100 MG tablet  Hyperlipemia  Diabetes  -Patient advised to return or notify a doctor immediately if symptoms worsen or persist or new concerns arise.  Patient Instructions  -warm compresses twice daily  -As we discussed, we have prescribed a new medication (doxycycline) for you at this appointment. We discussed the common and serious potential adverse effects of this medication and you can review these and more with the pharmacist when you pick up your medication.  Please follow the instructions for use carefully and notify us immediately if you have any problems taking this medication.  -  follow up for morning appt in 2 weeks and come fasting, sooner if concerns     KIM, HANNAH R.

## 2013-07-14 NOTE — Addendum Note (Signed)
Addended by: Colleen Can on: 07/14/2013 01:58 PM   Modules accepted: Orders

## 2013-07-14 NOTE — Progress Notes (Signed)
Pre visit review using our clinic review tool, if applicable. No additional management support is needed unless otherwise documented below in the visit note. 

## 2013-07-14 NOTE — Patient Instructions (Signed)
-  warm compresses twice daily  -As we discussed, we have prescribed a new medication (doxycycline) for you at this appointment. We discussed the common and serious potential adverse effects of this medication and you can review these and more with the pharmacist when you pick up your medication.  Please follow the instructions for use carefully and notify us immediately if you have any problems taking this medication.  -follow up for morning appt in 2 weeks and come fasting, sooner if concerns

## 2013-07-16 ENCOUNTER — Telehealth: Payer: Self-pay

## 2013-07-16 NOTE — Telephone Encounter (Signed)
Relevant patient education mailed to patient.  

## 2013-07-28 ENCOUNTER — Other Ambulatory Visit (INDEPENDENT_AMBULATORY_CARE_PROVIDER_SITE_OTHER): Payer: 59

## 2013-07-28 DIAGNOSIS — E785 Hyperlipidemia, unspecified: Secondary | ICD-10-CM

## 2013-07-28 DIAGNOSIS — E119 Type 2 diabetes mellitus without complications: Secondary | ICD-10-CM

## 2013-07-28 LAB — LIPID PANEL
CHOL/HDL RATIO: 5
Cholesterol: 231 mg/dL — ABNORMAL HIGH (ref 0–200)
HDL: 49 mg/dL (ref >39.00)
Triglycerides: 120 mg/dL (ref 0.0–149.0)
VLDL: 24 mg/dL (ref 0.0–40.0)

## 2013-07-28 LAB — LDL CHOLESTEROL, DIRECT: LDL DIRECT: 170 mg/dL

## 2013-07-28 LAB — HEMOGLOBIN A1C: Hgb A1c MFr Bld: 6.2 % (ref 4.6–6.5)

## 2013-10-20 ENCOUNTER — Ambulatory Visit
Admission: RE | Admit: 2013-10-20 | Discharge: 2013-10-20 | Disposition: A | Discharge status: Home / Self Care | Payer: 59 | Source: Ambulatory Visit | Attending: Neurology | Admitting: Neurology

## 2013-10-20 ENCOUNTER — Encounter (INDEPENDENT_AMBULATORY_CARE_PROVIDER_SITE_OTHER): Payer: Self-pay

## 2013-10-20 ENCOUNTER — Encounter: Payer: Self-pay | Admitting: Neurology

## 2013-10-20 ENCOUNTER — Telehealth: Payer: Self-pay | Admitting: Neurology

## 2013-10-20 ENCOUNTER — Ambulatory Visit (INDEPENDENT_AMBULATORY_CARE_PROVIDER_SITE_OTHER): Payer: 59 | Admitting: Neurology

## 2013-10-20 VITALS — BP 150/80 | HR 72 | Ht 69.0 in | Wt 197.0 lb

## 2013-10-20 DIAGNOSIS — M79609 Pain in unspecified limb: Secondary | ICD-10-CM

## 2013-10-20 HISTORY — DX: Pain in unspecified limb: M79.609

## 2013-10-20 MED ORDER — IBUPROFEN 800 MG PO TABS: 800.0000 mg | ORAL_TABLET | Freq: Three times a day (TID) | ORAL | Status: DC

## 2013-10-20 MED ORDER — PREDNISONE 5 MG PO TABS: ORAL_TABLET | ORAL | Status: DC

## 2013-10-20 NOTE — Progress Notes (Signed)
Reason for visit: Leg pain  Adam Perkins is a 63 y.o. male  History of present illness:  Adam Perkins is a 62 year old right-handed white male with a history of right hip discomfort that dates back about one year. He indicates in the past, he had similar problems on the left hip, but this improved spontaneously. He currently indicates that the pain is localized to the greater trochanter on the right hip, with some spread into the right buttocks area. He denies any low back pain, and rarely has pain down the leg. Occasionally, he will have some discomfort down into the thigh and sometimes just below the knee. The pain does not go to the foot. He indicates that there may be some weakness with the leg, but much of this is related to avoidance of effort for fear of discomfort. He indicates that he has very little pain usually, but when he tries to run, or if he rolls on his right hip at night while sleeping, he will increase the discomfort. He denies any sensory changes or numbness. He denies any issues with the left leg, or with the upper extremities. He denies problems controlling the bowels or the bladder, or problems with balance. He was seen by Dr. Ella Bodo, and he was told that he may have trochanteric bursitis or piriformis syndrome. He was to go on ibuprofen, but he never took the medication. He has not had any x-rays of the back or the hip. He comes to this office for further evaluation.  Past Medical History  Diagnosis Date  . Chicken pox   . Kidney stone     nephrolithiasis  . Diabetes 08/19/2012  . Hyperlipemia 08/19/2012  . Elevated blood pressure 08/19/2012  . Pain in limb 10/20/2013    History reviewed. No pertinent past surgical history.  Family History  Problem Relation Age of Onset  . Arthritis Father   . Cancer Father 51    bladder cancer  . Diabetes Mother     Social history:  reports that he has been smoking.  He has never used smokeless tobacco. He reports that he drinks  alcohol. His drug history is not on file.  Medications:  Current Outpatient Prescriptions on File Prior to Visit  Medication Sig Dispense Refill  . Naftifine HCl (NAFTIN) 2 % CREA Apply to affected areas of both feet twice daily as instructed  60 g  4  . timolol (BETIMOL) 0.25 % ophthalmic solution Place 1 drop into both eyes 2 (two) times daily.       No current facility-administered medications on file prior to visit.     No Known Allergies  ROS:  Out of a complete 14 system review of symptoms, the patient complains only of the following symptoms, and all other reviewed systems are negative.  Joint pain  Blood pressure 150/80, pulse 72, height 5\' 9"  (1.753 m), weight 197 lb (89.359 kg).  Physical Exam  General: The patient is alert and cooperative at the time of the examination.  Eyes: Pupils are equal, round, and reactive to light. Discs are flat bilaterally.  Neck: The neck is supple, no carotid bruits are noted.  Respiratory: The respiratory examination is clear.  Cardiovascular: The cardiovascular examination reveals a regular rate and rhythm, no obvious murmurs or rubs are noted.  Neuromuscular: Range of movement of the low back is full. The patient has some mild tenderness with palpation of the right greater trochanter. With external rotation of the right hip, the  pain in the hip increases.  Skin: Extremities are without significant edema.  Neurologic Exam  Mental status: The patient is alert and oriented x 3 at the time of the examination. The patient has apparent normal recent and remote memory, with an apparently normal attention span and concentration ability.  Cranial nerves: Facial symmetry is present. There is good sensation of the face to pinprick and soft touch bilaterally. The strength of the facial muscles and the muscles to head turning and shoulder shrug are normal bilaterally. Speech is well enunciated, no aphasia or dysarthria is noted. Extraocular  movements are full. Visual fields are full. The tongue is midline, and the patient has symmetric elevation of the soft palate. No obvious hearing deficits are noted.  Motor: The motor testing reveals 5 over 5 strength of all 4 extremities. Good symmetric motor tone is noted throughout.  Sensory: Sensory testing is intact to pinprick, soft touch, vibration sensation, and position sense on all 4 extremities. No evidence of extinction is noted.  Coordination: Cerebellar testing reveals good finger-nose-finger and heel-to-shin bilaterally.  Gait and station: Gait is normal. Tandem gait is normal. Romberg is negative. No drift is seen. He is able to walk on heels and toes.   Reflexes: Deep tendon reflexes are symmetric and normal bilaterally, with exception of ankle jerk reflexes are depressed bilaterally. Toes are downgoing bilaterally.   Assessment/Plan:  1. Right hip discomfort  The patient is to give a trial on low-dose prednisone Dosepak, and then convert to Motrin 800 mg 3 times daily for 6 weeks. If the pain has not improved, a referral to an orthopedic surgeon may be in order. He is to avoid activities that worsen the pain. I believe that the patient does have a mechanical source pain, likely a trochanteric bursitis. He will followup if needed.  Jill Alexanders MD 10/20/2013 10:15 AM  [facility] [address] [place], [place] [address]  Phone [phone] Fax [phone]

## 2013-10-20 NOTE — Telephone Encounter (Signed)
I called patient. The x-ray of the right hip shows mild degenerative changes, mild arthritis. He will try the prednisone, and Motrin. If the pain does not improve, he will contact me. We will consider a referral to an orthopedic surgeon at that time.

## 2013-12-15 ENCOUNTER — Telehealth: Payer: Self-pay | Admitting: Neurology

## 2013-12-15 DIAGNOSIS — M25551 Pain in right hip: Principal | ICD-10-CM

## 2013-12-15 DIAGNOSIS — G8929 Other chronic pain: Secondary | ICD-10-CM

## 2013-12-15 NOTE — Telephone Encounter (Signed)
I called patient. He is having ongoing issues with the right hip, losing mobility at the hip joint. I will refer to [facility] [place]. The patient indicates that prednisone and Motrin did help some while he was taking them.

## 2013-12-15 NOTE — Telephone Encounter (Signed)
Patient calling to state that the Prednisone he was given 5 weeks ago didn't help, wants a call to discuss the next steps. Please return call to patient and advise.

## 2013-12-15 NOTE — Telephone Encounter (Signed)
Please advise previous note. Thanks  °

## 2013-12-17 ENCOUNTER — Telehealth: Payer: Self-pay | Admitting: *Deleted

## 2013-12-17 NOTE — Telephone Encounter (Signed)
Spoke with patient to confirm his appointment with Dr. Drema Dallas at [facility] on January 05, 2014 at 9 am, patient is aware and will keep his appointment

## 2014-02-03 ENCOUNTER — Ambulatory Visit: Payer: 59 | Attending: Sports Medicine | Admitting: Physical Therapy

## 2014-02-03 DIAGNOSIS — IMO0001 Reserved for inherently not codable concepts without codable children: Secondary | ICD-10-CM | POA: Insufficient documentation

## 2014-02-03 DIAGNOSIS — M25559 Pain in unspecified hip: Secondary | ICD-10-CM | POA: Insufficient documentation

## 2014-02-13 ENCOUNTER — Ambulatory Visit: Payer: 59 | Admitting: Physical Therapy

## 2014-02-13 DIAGNOSIS — IMO0001 Reserved for inherently not codable concepts without codable children: Secondary | ICD-10-CM | POA: Diagnosis not present

## 2014-02-17 ENCOUNTER — Encounter: Payer: 59 | Admitting: Physical Therapy

## 2014-02-20 ENCOUNTER — Ambulatory Visit: Payer: 59 | Attending: Sports Medicine | Admitting: Physical Therapy

## 2014-02-20 DIAGNOSIS — M25559 Pain in unspecified hip: Secondary | ICD-10-CM | POA: Insufficient documentation

## 2014-02-20 DIAGNOSIS — IMO0001 Reserved for inherently not codable concepts without codable children: Secondary | ICD-10-CM | POA: Insufficient documentation

## 2014-03-11 ENCOUNTER — Encounter: Payer: Self-pay | Admitting: Family Medicine

## 2014-04-08 ENCOUNTER — Other Ambulatory Visit: Payer: Self-pay | Admitting: Physical Medicine and Rehabilitation

## 2014-04-08 DIAGNOSIS — M25551 Pain in right hip: Secondary | ICD-10-CM

## 2014-04-09 ENCOUNTER — Other Ambulatory Visit: Payer: 59

## 2014-04-14 ENCOUNTER — Other Ambulatory Visit: Payer: 59

## 2014-04-22 ENCOUNTER — Ambulatory Visit (HOSPITAL_COMMUNITY)
Admission: RE | Admit: 2014-04-22 | Discharge: 2014-04-22 | Disposition: A | Discharge status: Home / Self Care | Payer: 59 | Source: Ambulatory Visit | Attending: Diagnostic Radiology | Admitting: Diagnostic Radiology

## 2014-04-22 DIAGNOSIS — M87351 Other secondary osteonecrosis, right femur: Secondary | ICD-10-CM | POA: Diagnosis not present

## 2014-04-22 DIAGNOSIS — M25451 Effusion, right hip: Secondary | ICD-10-CM | POA: Insufficient documentation

## 2014-04-22 DIAGNOSIS — M1990 Unspecified osteoarthritis, unspecified site: Secondary | ICD-10-CM | POA: Insufficient documentation

## 2014-04-22 DIAGNOSIS — M659 Synovitis and tenosynovitis, unspecified: Secondary | ICD-10-CM | POA: Diagnosis not present

## 2014-04-22 DIAGNOSIS — M25551 Pain in right hip: Secondary | ICD-10-CM

## 2014-05-06 ENCOUNTER — Other Ambulatory Visit (HOSPITAL_COMMUNITY): Payer: Self-pay

## 2014-05-06 ENCOUNTER — Other Ambulatory Visit (HOSPITAL_COMMUNITY): Payer: Self-pay | Admitting: Orthopaedic Surgery

## 2014-05-19 NOTE — Patient Instructions (Addendum)
Adam Perkins  05/19/2014   Your procedure is scheduled on:  05/28/2014    Come thru the [facility] entrance.    Follow the Signs to [facility] at  0700   Remember:   Do not eat food or drink liquids after midnight.   Take these medicines the morning of surgery with A SIP OF WATER:  Timolol eye drops    Do not wear jewelry,   Do not wear lotions, powders, or perfumes.  deodorant.  . Men may shave face and neck.  Do not bring valuables to the hospital.  Contacts, dentures or bridgework may not be worn into surgery.  Leave suitcase in the car. After surgery it may be brought to your room.  For patients admitted to the hospital, checkout time is 11:00 AM the day of  discharge.         Please read over the following fact sheets that you were given: MRSA Information, coughing and deep breathing exercises, leg exercises            [facility] - Preparing for Surgery Before surgery, you can play an important role.  Because skin is not sterile, your skin needs to be as free of germs as possible.  You can reduce the number of germs on your skin by washing with CHG (chlorahexidine gluconate) soap before surgery.  CHG is an antiseptic cleaner which kills germs and bonds with the skin to continue killing germs even after washing. Please DO NOT use if you have an allergy to CHG or antibacterial soaps.  If your skin becomes reddened/irritated stop using the CHG and inform your nurse when you arrive at Short Stay. Do not shave (including legs and underarms) for at least 48 hours prior to the first CHG shower.  You may shave your face/neck. Please follow these instructions carefully:  1.  Shower with CHG Soap the night before surgery and the  morning of Surgery.  2.  If you choose to wash your hair, wash your hair first as usual with your  normal  shampoo.  3.  After you shampoo, rinse your hair and body thoroughly to remove the  shampoo.                           4.  Use CHG as you would  any other liquid soap.  You can apply chg directly  to the skin and wash                       Gently with a scrungie or clean washcloth.  5.  Apply the CHG Soap to your body ONLY FROM THE NECK DOWN.   Do not use on face/ open                           Wound or open sores. Avoid contact with eyes, ears mouth and genitals (private parts).                       Wash face,  Genitals (private parts) with your normal soap.             6.  Wash thoroughly, paying special attention to the area where your surgery  will be performed.  7.  Thoroughly rinse your body with warm water from the neck down.  8.  DO NOT shower/wash with your  normal soap after using and rinsing off  the CHG Soap.                9.  Pat yourself dry with a clean towel.            10.  Wear clean pajamas.            11.  Place clean sheets on your bed the night of your first shower and do not  sleep with pets. Day of Surgery : Do not apply any lotions/deodorants the morning of surgery.  Please wear clean clothes to the hospital/surgery center.  FAILURE TO FOLLOW THESE INSTRUCTIONS MAY RESULT IN THE CANCELLATION OF YOUR SURGERY PATIENT SIGNATURE_________________________________  NURSE SIGNATURE__________________________________  ________________________________________________________________________  WHAT IS A BLOOD TRANSFUSION? Blood Transfusion Information  A transfusion is the replacement of blood or some of its parts. Blood is made up of multiple cells which provide different functions.  Red blood cells carry oxygen and are used for blood loss replacement.  White blood cells fight against infection.  Platelets control bleeding.  Plasma helps clot blood.  Other blood products are available for specialized needs, such as hemophilia or other clotting disorders. BEFORE THE TRANSFUSION  Who gives blood for transfusions?   Healthy volunteers who are fully evaluated to make sure their blood is safe. This is blood bank  blood. Transfusion therapy is the safest it has ever been in the practice of medicine. Before blood is taken from a donor, a complete history is taken to make sure that person has no history of diseases nor engages in risky social behavior (examples are intravenous drug use or sexual activity with multiple partners). The donor's travel history is screened to minimize risk of transmitting infections, such as malaria. The donated blood is tested for signs of infectious diseases, such as HIV and hepatitis. The blood is then tested to be sure it is compatible with you in order to minimize the chance of a transfusion reaction. If you or a relative donates blood, this is often done in anticipation of surgery and is not appropriate for emergency situations. It takes many days to process the donated blood. RISKS AND COMPLICATIONS Although transfusion therapy is very safe and saves many lives, the main dangers of transfusion include:   Getting an infectious disease.  Developing a transfusion reaction. This is an allergic reaction to something in the blood you were given. Every precaution is taken to prevent this. The decision to have a blood transfusion has been considered carefully by your caregiver before blood is given. Blood is not given unless the benefits outweigh the risks. AFTER THE TRANSFUSION  Right after receiving a blood transfusion, you will usually feel much better and more energetic. This is especially true if your red blood cells have gotten low (anemic). The transfusion raises the level of the red blood cells which carry oxygen, and this usually causes an energy increase.  The nurse administering the transfusion will monitor you carefully for complications. HOME CARE INSTRUCTIONS  No special instructions are needed after a transfusion. You may find your energy is better. Speak with your caregiver about any limitations on activity for underlying diseases you may have. SEEK MEDICAL CARE IF:    Your condition is not improving after your transfusion.  You develop redness or irritation at the intravenous (IV) site. SEEK IMMEDIATE MEDICAL CARE IF:  Any of the following symptoms occur over the next 12 hours:  Shaking chills.  You have a temperature by mouth  above 102 F (38.9 C), not controlled by medicine.  Chest, back, or muscle pain.  People around you feel you are not acting correctly or are confused.  Shortness of breath or difficulty breathing.  Dizziness and fainting.  You get a rash or develop hives.  You have a decrease in urine output.  Your urine turns a dark color or changes to pink, red, or brown. Any of the following symptoms occur over the next 10 days:  You have a temperature by mouth above 102 F (38.9 C), not controlled by medicine.  Shortness of breath.  Weakness after normal activity.  The white part of the eye turns yellow (jaundice).  You have a decrease in the amount of urine or are urinating less often.  Your urine turns a dark color or changes to pink, red, or brown. Document Released: 06/02/2000 Document Revised: 08/28/2011 Document Reviewed: 01/20/2008 [facility] Patient Information 2014 [place], [place].  _______________________________________________________________________

## 2014-05-20 ENCOUNTER — Encounter (HOSPITAL_COMMUNITY)
Admission: RE | Admit: 2014-05-20 | Discharge: 2014-05-20 | Disposition: A | Discharge status: Home / Self Care | Payer: 59 | Source: Ambulatory Visit | Attending: Orthopaedic Surgery | Admitting: Orthopaedic Surgery

## 2014-05-20 ENCOUNTER — Encounter (HOSPITAL_COMMUNITY): Payer: Self-pay

## 2014-05-20 DIAGNOSIS — Z01812 Encounter for preprocedural laboratory examination: Secondary | ICD-10-CM | POA: Diagnosis not present

## 2014-05-20 HISTORY — DX: Unspecified osteoarthritis, unspecified site: M19.90

## 2014-05-20 LAB — URINALYSIS, ROUTINE W REFLEX MICROSCOPIC
BILIRUBIN URINE: NEGATIVE
GLUCOSE, UA: NEGATIVE mg/dL
HGB URINE DIPSTICK: NEGATIVE
Ketones, ur: NEGATIVE mg/dL
Leukocytes, UA: NEGATIVE
Nitrite: NEGATIVE
PROTEIN: NEGATIVE mg/dL
Specific Gravity, Urine: 1.022 (ref 1.005–1.030)
Urobilinogen, UA: 1 mg/dL (ref 0.0–1.0)
pH: 6 (ref 5.0–8.0)

## 2014-05-20 LAB — CBC
HEMATOCRIT: 45.2 % (ref 39.0–52.0)
Hemoglobin: 15 g/dL (ref 13.0–17.0)
MCH: 31.7 pg (ref 26.0–34.0)
MCHC: 33.2 g/dL (ref 30.0–36.0)
MCV: 95.6 fL (ref 78.0–100.0)
Platelets: 245 10*3/uL (ref 150–400)
RBC: 4.73 MIL/uL (ref 4.22–5.81)
RDW: 12.8 % (ref 11.5–15.5)
WBC: 6.5 10*3/uL (ref 4.0–10.5)

## 2014-05-20 LAB — BASIC METABOLIC PANEL
Anion gap: 16 — ABNORMAL HIGH (ref 5–15)
BUN: 14 mg/dL (ref 6–23)
CO2: 24 meq/L (ref 19–32)
CREATININE: 0.73 mg/dL (ref 0.50–1.35)
Calcium: 10.1 mg/dL (ref 8.4–10.5)
Chloride: 99 mEq/L (ref 96–112)
GFR calc Af Amer: >90 mL/min (ref >90)
GFR calc non Af Amer: >90 mL/min (ref >90)
Glucose, Bld: 124 mg/dL — ABNORMAL HIGH (ref 70–99)
Potassium: 4.5 mEq/L (ref 3.7–5.3)
Sodium: 139 mEq/L (ref 137–147)

## 2014-05-20 LAB — APTT: aPTT: 33 seconds (ref 24–37)

## 2014-05-20 LAB — PROTIME-INR
INR: 0.94 (ref 0.00–1.49)
Prothrombin Time: 12.6 seconds (ref 11.6–15.2)

## 2014-05-20 LAB — SURGICAL PCR SCREEN
MRSA, PCR: NEGATIVE
STAPHYLOCOCCUS AUREUS: NEGATIVE

## 2014-05-27 NOTE — Anesthesia Preprocedure Evaluation (Addendum)
Anesthesia Evaluation  Patient identified by MRN, date of birth, ID band Patient awake    Reviewed: Allergy & Precautions, H&P , NPO status , Patient's Chart, lab work & pertinent test results  History of Anesthesia Complications Negative for: history of anesthetic complications  Airway Mallampati: II  TM Distance: >3 FB Neck ROM: Full    Dental no notable dental hx. (+) Dental Advisory Given   Pulmonary Current Smoker,  breath sounds clear to auscultation  Pulmonary exam normal       Cardiovascular Exercise Tolerance: Good negative cardio ROS  Rhythm:Regular Rate:Normal     Neuro/Psych negative neurological ROS  negative psych ROS   GI/Hepatic negative GI ROS, Neg liver ROS,   Endo/Other  negative endocrine ROS  Renal/GU negative Renal ROS  negative genitourinary   Musculoskeletal  (+) Arthritis -, Osteoarthritis,    Abdominal   Peds negative pediatric ROS (+)  Hematology negative hematology ROS (+)   Anesthesia Other Findings   Reproductive/Obstetrics negative OB ROS                            Anesthesia Physical Anesthesia Plan  ASA: II  Anesthesia Plan: Spinal   Post-op Pain Management:    Induction: Intravenous  Airway Management Planned: Nasal Cannula  Additional Equipment:   Intra-op Plan:   Post-operative Plan: Extubation in OR  Informed Consent: I have reviewed the patients History and Physical, chart, labs and discussed the procedure including the risks, benefits and alternatives for the proposed anesthesia with the patient or authorized representative who has indicated his/her understanding and acceptance.   Dental advisory given  Plan Discussed with: CRNA  Anesthesia Plan Comments:        Anesthesia Quick Evaluation

## 2014-05-28 ENCOUNTER — Inpatient Hospital Stay (HOSPITAL_COMMUNITY)
Admission: RE | Admit: 2014-05-28 | Discharge: 2014-05-29 | DRG: 470 | Disposition: A | Discharge status: Home Health | Payer: 59 | Source: Ambulatory Visit | Attending: Orthopaedic Surgery | Admitting: Orthopaedic Surgery

## 2014-05-28 ENCOUNTER — Encounter (HOSPITAL_COMMUNITY): Payer: Self-pay | Admitting: *Deleted

## 2014-05-28 ENCOUNTER — Inpatient Hospital Stay (HOSPITAL_COMMUNITY): Payer: 59 | Admitting: Anesthesiology

## 2014-05-28 ENCOUNTER — Inpatient Hospital Stay (HOSPITAL_COMMUNITY): Payer: 59

## 2014-05-28 ENCOUNTER — Encounter (HOSPITAL_COMMUNITY)
Admission: RE | Disposition: A | Discharge status: Home Health | Payer: Self-pay | Source: Ambulatory Visit | Attending: Orthopaedic Surgery

## 2014-05-28 DIAGNOSIS — E785 Hyperlipidemia, unspecified: Secondary | ICD-10-CM | POA: Diagnosis present

## 2014-05-28 DIAGNOSIS — M87851 Other osteonecrosis, right femur: Principal | ICD-10-CM | POA: Diagnosis present

## 2014-05-28 DIAGNOSIS — E119 Type 2 diabetes mellitus without complications: Secondary | ICD-10-CM | POA: Diagnosis present

## 2014-05-28 DIAGNOSIS — F17210 Nicotine dependence, cigarettes, uncomplicated: Secondary | ICD-10-CM | POA: Diagnosis present

## 2014-05-28 DIAGNOSIS — Z96641 Presence of right artificial hip joint: Secondary | ICD-10-CM

## 2014-05-28 DIAGNOSIS — M1611 Unilateral primary osteoarthritis, right hip: Secondary | ICD-10-CM | POA: Diagnosis present

## 2014-05-28 DIAGNOSIS — M87051 Idiopathic aseptic necrosis of right femur: Secondary | ICD-10-CM

## 2014-05-28 DIAGNOSIS — M25551 Pain in right hip: Secondary | ICD-10-CM | POA: Diagnosis present

## 2014-05-28 DIAGNOSIS — Z419 Encounter for procedure for purposes other than remedying health state, unspecified: Secondary | ICD-10-CM

## 2014-05-28 HISTORY — DX: Presence of right artificial hip joint: Z96.641

## 2014-05-28 HISTORY — PX: TOTAL HIP ARTHROPLASTY: SHX124

## 2014-05-28 HISTORY — DX: Idiopathic aseptic necrosis of right femur: M87.051

## 2014-05-28 LAB — TYPE AND SCREEN
ABO/RH(D): AB POS
Antibody Screen: NEGATIVE

## 2014-05-28 LAB — ABO/RH: ABO/RH(D): AB POS

## 2014-05-28 SURGERY — ARTHROPLASTY, HIP, TOTAL, ANTERIOR APPROACH
Anesthesia: Spinal | Site: Hip | Laterality: Right

## 2014-05-28 MED ORDER — NICOTINE 14 MG/24HR TD PT24
14.0000 mg | MEDICATED_PATCH | Freq: Every day | TRANSDERMAL | Status: DC
  Administered 2014-05-28 – 2014-05-29 (×2): 14 mg via TRANSDERMAL
  Filled 2014-05-28 (×2): qty 1

## 2014-05-28 MED ORDER — PROPOFOL INFUSION 10 MG/ML OPTIME
INTRAVENOUS | Status: DC | PRN
  Administered 2014-05-28 (×2): 200 ug/kg/min via INTRAVENOUS

## 2014-05-28 MED ORDER — ACETAMINOPHEN 650 MG RE SUPP: 650.0000 mg | Freq: Four times a day (QID) | RECTAL | Status: DC | PRN

## 2014-05-28 MED ORDER — CEFAZOLIN SODIUM-DEXTROSE 2-3 GM-% IV SOLR
INTRAVENOUS | Status: AC
Start: 2014-05-28 — End: 2014-05-28
  Filled 2014-05-28: qty 50

## 2014-05-28 MED ORDER — PROPOFOL 10 MG/ML IV BOLUS
INTRAVENOUS | Status: AC
  Filled 2014-05-28: qty 20

## 2014-05-28 MED ORDER — MIDAZOLAM HCL 2 MG/2ML IJ SOLN
INTRAMUSCULAR | Status: AC
  Filled 2014-05-28: qty 2

## 2014-05-28 MED ORDER — PHENOL 1.4 % MT LIQD
1.0000 | OROMUCOSAL | Status: DC | PRN
  Filled 2014-05-28: qty 177

## 2014-05-28 MED ORDER — CEFAZOLIN SODIUM-DEXTROSE 2-3 GM-% IV SOLR
2.0000 g | INTRAVENOUS | Status: AC
  Administered 2014-05-28: 2 g via INTRAVENOUS

## 2014-05-28 MED ORDER — KETOROLAC TROMETHAMINE 15 MG/ML IJ SOLN
15.0000 mg | Freq: Four times a day (QID) | INTRAMUSCULAR | Status: DC | PRN
  Administered 2014-05-28 – 2014-05-29 (×2): 15 mg via INTRAVENOUS
  Filled 2014-05-28 (×2): qty 1

## 2014-05-28 MED ORDER — TIMOLOL MALEATE 0.25 % OP SOLN
1.0000 [drp] | Freq: Two times a day (BID) | OPHTHALMIC | Status: DC
  Administered 2014-05-28 – 2014-05-29 (×2): 1 [drp] via OPHTHALMIC
  Filled 2014-05-28: qty 5

## 2014-05-28 MED ORDER — PHENYLEPHRINE HCL 10 MG/ML IJ SOLN
INTRAMUSCULAR | Status: DC | PRN
  Administered 2014-05-28: 120 ug via INTRAVENOUS

## 2014-05-28 MED ORDER — MIDAZOLAM HCL 5 MG/5ML IJ SOLN
INTRAMUSCULAR | Status: DC | PRN
  Administered 2014-05-28: 2 mg via INTRAVENOUS

## 2014-05-28 MED ORDER — CEFAZOLIN SODIUM 1-5 GM-% IV SOLN
1.0000 g | Freq: Four times a day (QID) | INTRAVENOUS | Status: AC
  Administered 2014-05-28 (×2): 1 g via INTRAVENOUS
  Filled 2014-05-28 (×2): qty 50

## 2014-05-28 MED ORDER — LACTATED RINGERS IV SOLN: INTRAVENOUS | Status: DC

## 2014-05-28 MED ORDER — BUPIVACAINE HCL (PF) 0.5 % IJ SOLN
INTRAMUSCULAR | Status: DC | PRN
  Administered 2014-05-28: 3 mL

## 2014-05-28 MED ORDER — PROPOFOL 10 MG/ML IV BOLUS
INTRAVENOUS | Status: DC | PRN
  Administered 2014-05-28: 30 mg via INTRAVENOUS

## 2014-05-28 MED ORDER — POLYETHYLENE GLYCOL 3350 17 G PO PACK: 17.0000 g | PACK | Freq: Every day | ORAL | Status: DC | PRN

## 2014-05-28 MED ORDER — PHENYLEPHRINE 40 MCG/ML (10ML) SYRINGE FOR IV PUSH (FOR BLOOD PRESSURE SUPPORT)
PREFILLED_SYRINGE | INTRAVENOUS | Status: AC
  Filled 2014-05-28: qty 10

## 2014-05-28 MED ORDER — 0.9 % SODIUM CHLORIDE (POUR BTL) OPTIME
TOPICAL | Status: DC | PRN
  Administered 2014-05-28: 1000 mL

## 2014-05-28 MED ORDER — SELENIUM 50 MCG PO TABS
50.0000 ug | ORAL_TABLET | Freq: Every day | ORAL | Status: DC
  Filled 2014-05-28: qty 1

## 2014-05-28 MED ORDER — MENTHOL 3 MG MT LOZG
1.0000 | LOZENGE | OROMUCOSAL | Status: DC | PRN
  Filled 2014-05-28: qty 9

## 2014-05-28 MED ORDER — ONDANSETRON HCL 4 MG/2ML IJ SOLN: 4.0000 mg | Freq: Four times a day (QID) | INTRAMUSCULAR | Status: DC | PRN

## 2014-05-28 MED ORDER — SODIUM CHLORIDE 0.9 % IR SOLN
Status: DC | PRN
  Administered 2014-05-28: 1000 mL

## 2014-05-28 MED ORDER — ALUM & MAG HYDROXIDE-SIMETH 200-200-20 MG/5ML PO SUSP: 30.0000 mL | ORAL | Status: DC | PRN

## 2014-05-28 MED ORDER — FENTANYL CITRATE 0.05 MG/ML IJ SOLN
INTRAMUSCULAR | Status: AC
  Filled 2014-05-28: qty 2

## 2014-05-28 MED ORDER — OXYCODONE HCL 5 MG PO TABS
5.0000 mg | ORAL_TABLET | ORAL | Status: DC | PRN
  Administered 2014-05-28: 5 mg via ORAL
  Administered 2014-05-28: 10 mg via ORAL
  Administered 2014-05-28: 5 mg via ORAL
  Administered 2014-05-29 (×3): 10 mg via ORAL
  Filled 2014-05-28: qty 2
  Filled 2014-05-28: qty 1
  Filled 2014-05-28: qty 2
  Filled 2014-05-28: qty 1
  Filled 2014-05-28 (×2): qty 2

## 2014-05-28 MED ORDER — ACETAMINOPHEN 325 MG PO TABS: 650.0000 mg | ORAL_TABLET | Freq: Four times a day (QID) | ORAL | Status: DC | PRN

## 2014-05-28 MED ORDER — ONDANSETRON HCL 4 MG PO TABS: 4.0000 mg | ORAL_TABLET | Freq: Four times a day (QID) | ORAL | Status: DC | PRN

## 2014-05-28 MED ORDER — ONDANSETRON HCL 4 MG/2ML IJ SOLN: 4.0000 mg | Freq: Once | INTRAMUSCULAR | Status: DC | PRN

## 2014-05-28 MED ORDER — HYDROMORPHONE HCL 1 MG/ML IJ SOLN
1.0000 mg | INTRAMUSCULAR | Status: DC | PRN
  Administered 2014-05-28 (×2): 1 mg via INTRAVENOUS
  Filled 2014-05-28 (×2): qty 1

## 2014-05-28 MED ORDER — TIMOLOL HEMIHYDRATE 0.25 % OP SOLN: 1.0000 [drp] | Freq: Two times a day (BID) | OPHTHALMIC | Status: DC

## 2014-05-28 MED ORDER — FENTANYL CITRATE 0.05 MG/ML IJ SOLN: 25.0000 ug | INTRAMUSCULAR | Status: DC | PRN

## 2014-05-28 MED ORDER — METOCLOPRAMIDE HCL 10 MG PO TABS: 5.0000 mg | ORAL_TABLET | Freq: Three times a day (TID) | ORAL | Status: DC | PRN

## 2014-05-28 MED ORDER — METOCLOPRAMIDE HCL 5 MG/ML IJ SOLN: 5.0000 mg | Freq: Three times a day (TID) | INTRAMUSCULAR | Status: DC | PRN

## 2014-05-28 MED ORDER — METHOCARBAMOL 500 MG PO TABS
500.0000 mg | ORAL_TABLET | Freq: Four times a day (QID) | ORAL | Status: DC | PRN
  Administered 2014-05-28 – 2014-05-29 (×2): 500 mg via ORAL
  Filled 2014-05-28 (×2): qty 1

## 2014-05-28 MED ORDER — DIPHENHYDRAMINE HCL 12.5 MG/5ML PO ELIX: 12.5000 mg | ORAL_SOLUTION | ORAL | Status: DC | PRN

## 2014-05-28 MED ORDER — ZOLPIDEM TARTRATE 5 MG PO TABS: 5.0000 mg | ORAL_TABLET | Freq: Every evening | ORAL | Status: DC | PRN

## 2014-05-28 MED ORDER — ASPIRIN EC 325 MG PO TBEC
325.0000 mg | DELAYED_RELEASE_TABLET | Freq: Two times a day (BID) | ORAL | Status: DC
  Administered 2014-05-28 – 2014-05-29 (×2): 325 mg via ORAL
  Filled 2014-05-28 (×4): qty 1

## 2014-05-28 MED ORDER — TRANEXAMIC ACID 100 MG/ML IV SOLN
1000.0000 mg | INTRAVENOUS | Status: AC
  Administered 2014-05-28: 1000 mg via INTRAVENOUS
  Filled 2014-05-28: qty 10

## 2014-05-28 MED ORDER — BUPIVACAINE HCL (PF) 0.5 % IJ SOLN
INTRAMUSCULAR | Status: AC
  Filled 2014-05-28: qty 30

## 2014-05-28 MED ORDER — DOCUSATE SODIUM 100 MG PO CAPS
100.0000 mg | ORAL_CAPSULE | Freq: Two times a day (BID) | ORAL | Status: DC
  Administered 2014-05-28 – 2014-05-29 (×3): 100 mg via ORAL

## 2014-05-28 MED ORDER — FENTANYL CITRATE 0.05 MG/ML IJ SOLN
INTRAMUSCULAR | Status: DC | PRN
  Administered 2014-05-28: 25 ug via INTRAVENOUS
  Administered 2014-05-28: 50 ug via INTRAVENOUS

## 2014-05-28 MED ORDER — METHOCARBAMOL 1000 MG/10ML IJ SOLN
500.0000 mg | Freq: Four times a day (QID) | INTRAVENOUS | Status: DC | PRN
  Administered 2014-05-28: 500 mg via INTRAVENOUS
  Filled 2014-05-28 (×2): qty 5

## 2014-05-28 MED ORDER — LACTATED RINGERS IV SOLN
INTRAVENOUS | Status: DC | PRN
  Administered 2014-05-28 (×4): via INTRAVENOUS

## 2014-05-28 MED ORDER — SODIUM CHLORIDE 0.9 % IV SOLN
INTRAVENOUS | Status: DC
  Administered 2014-05-28: 75 mL/h via INTRAVENOUS

## 2014-05-28 SURGICAL SUPPLY — 39 items
BAG ZIPLOCK 12X15 (MISCELLANEOUS) IMPLANT
BENZOIN TINCTURE PRP APPL 2/3 (GAUZE/BANDAGES/DRESSINGS) ×3 IMPLANT
BLADE SAW SGTL 18X1.27X75 (BLADE) ×2 IMPLANT
BLADE SAW SGTL 18X1.27X75MM (BLADE) ×1
CAPT HIP TOTAL 2 ×3 IMPLANT
CELLS DAT CNTRL 66122 CELL SVR (MISCELLANEOUS) ×1 IMPLANT
CLOSURE WOUND 1/2 X4 (GAUZE/BANDAGES/DRESSINGS) ×1
COVER PERINEAL POST (MISCELLANEOUS) ×3 IMPLANT
DRAPE C-ARM 42X120 X-RAY (DRAPES) ×3 IMPLANT
DRAPE STERI IOBAN 125X83 (DRAPES) ×3 IMPLANT
DRAPE U-SHAPE 47X51 STRL (DRAPES) ×9 IMPLANT
DRSG AQUACEL AG ADV 3.5X10 (GAUZE/BANDAGES/DRESSINGS) ×3 IMPLANT
DURAPREP 26ML APPLICATOR (WOUND CARE) ×3 IMPLANT
ELECT BLADE TIP CTD 4 INCH (ELECTRODE) ×3 IMPLANT
ELECT REM PT RETURN 9FT ADLT (ELECTROSURGICAL) ×3
ELECTRODE REM PT RTRN 9FT ADLT (ELECTROSURGICAL) ×1 IMPLANT
FACESHIELD WRAPAROUND (MASK) ×9 IMPLANT
GAUZE XEROFORM 1X8 LF (GAUZE/BANDAGES/DRESSINGS) IMPLANT
GLOVE BIO SURGEON STRL SZ7.5 (GLOVE) ×3 IMPLANT
GLOVE BIOGEL PI IND STRL 8 (GLOVE) ×2 IMPLANT
GLOVE BIOGEL PI INDICATOR 8 (GLOVE) ×4
GLOVE ECLIPSE 8.0 STRL XLNG CF (GLOVE) ×3 IMPLANT
GOWN STRL REUS W/TWL XL LVL3 (GOWN DISPOSABLE) ×6 IMPLANT
HANDPIECE INTERPULSE COAX TIP (DISPOSABLE) ×2
KIT BASIN OR (CUSTOM PROCEDURE TRAY) ×3 IMPLANT
PACK TOTAL JOINT (CUSTOM PROCEDURE TRAY) ×3 IMPLANT
RTRCTR WOUND ALEXIS 18CM MED (MISCELLANEOUS) ×3
SET HNDPC FAN SPRY TIP SCT (DISPOSABLE) ×1 IMPLANT
STAPLER VISISTAT 35W (STAPLE) IMPLANT
STRIP CLOSURE SKIN 1/2X4 (GAUZE/BANDAGES/DRESSINGS) ×2 IMPLANT
SUT ETHIBOND NAB CT1 #1 30IN (SUTURE) ×3 IMPLANT
SUT MNCRL AB 4-0 PS2 18 (SUTURE) ×3 IMPLANT
SUT VIC AB 0 CT1 36 (SUTURE) ×3 IMPLANT
SUT VIC AB 1 CT1 36 (SUTURE) ×3 IMPLANT
SUT VIC AB 2-0 CT1 27 (SUTURE) ×4
SUT VIC AB 2-0 CT1 TAPERPNT 27 (SUTURE) ×2 IMPLANT
TOWEL OR 17X26 10 PK STRL BLUE (TOWEL DISPOSABLE) ×3 IMPLANT
TOWEL OR NON WOVEN STRL DISP B (DISPOSABLE) ×3 IMPLANT
TRAY FOLEY CATH 16FRSI W/METER (SET/KITS/TRAYS/PACK) ×3 IMPLANT

## 2014-05-28 NOTE — Transfer of Care (Signed)
Immediate Anesthesia Transfer of Care Note  Patient: Adam Perkins  Procedure(s) Performed: Procedure(s): RIGHT TOTAL HIP ARTHROPLASTY ANTERIOR APPROACH (Right)  Patient Location: PACU  Anesthesia Type:Regional and Spinal  Level of Consciousness: awake, alert  and patient cooperative  Airway & Oxygen Therapy: Patient Spontanous Breathing and Patient connected to face mask oxygen  Post-op Assessment: Report given to PACU RN and Post -op Vital signs reviewed and stable  Post vital signs: Reviewed and stable  Complications: No apparent anesthesia complications

## 2014-05-28 NOTE — Plan of Care (Signed)
Problem: Phase I Progression Outcomes Goal: Hemodynamically stable Outcome: Completed/Met Date Met:  05/28/14

## 2014-05-28 NOTE — Evaluation (Signed)
Physical Therapy Evaluation Patient Details Name: Adam Perkins MRN: [id number] DOB: 1951-05-08 Today's Date: 05/28/2014   History of Present Illness  R THR -   Clinical Impression  Pt s/p R THR presents with decreased R LE strength/ROM and post op pain limiting functional mobility.  Pt should progress to d/c home with family assist and HHPT follow up.    Follow Up Recommendations Home health PT    Equipment Recommendations  Rolling walker with 5" wheels    Recommendations for Other Services OT consult     Precautions / Restrictions Precautions Precautions: Fall Restrictions Weight Bearing Restrictions: No Other Position/Activity Restrictions: WBAT      Mobility  Bed Mobility Overal bed mobility: Needs Assistance Bed Mobility: Supine to Sit     Supine to sit: Min assist;Mod assist     General bed mobility comments: cues for sequence and use of L LE to self assist  Transfers Overall transfer level: Needs assistance Equipment used: Rolling walker (2 wheeled) Transfers: Sit to/from Stand Sit to Stand: Min assist;Mod assist         General transfer comment: cues for LE management and use of UEs to self assist  Ambulation/Gait Ambulation/Gait assistance: Min assist Ambulation Distance (Feet): 36 Feet Assistive device: Rolling walker (2 wheeled) Gait Pattern/deviations: Step-to pattern;Decreased step length - right;Decreased step length - left;Shuffle;Trunk flexed Gait velocity: decr Gait velocity interpretation: Below normal speed for age/gender General Gait Details: cues for posture, position from RW and sequence  Stairs            Wheelchair Mobility    Modified Rankin (Stroke Patients Only)       Balance                                             Pertinent Vitals/Pain Pain Assessment: 0-10 Pain Score: 5  Pain Location: R hip/thigh Pain Descriptors / Indicators: Burning Pain Intervention(s): Limited activity within  patient's tolerance;Monitored during session;Premedicated before session;Ice applied    Home Living Family/patient expects to be discharged to:: Private residence Living Arrangements: Spouse/significant other Available Help at Discharge: Family Type of Home: House Home Access: Stairs to enter Entrance Stairs-Rails: None Entrance Stairs-Number of Steps: 1 Home Layout: One level Home Equipment: Cane - single point      Prior Function Level of Independence: Independent;Independent with assistive device(s)               Hand Dominance        Extremity/Trunk Assessment   Upper Extremity Assessment: Overall WFL for tasks assessed           Lower Extremity Assessment: RLE deficits/detail RLE Deficits / Details: 2+/5 hip strength with AAROM at hip to 90 flex and 15 abd    Cervical / Trunk Assessment: Normal  Communication   Communication: No difficulties  Cognition Arousal/Alertness: Awake/alert Behavior During Therapy: WFL for tasks assessed/performed Overall Cognitive Status: Within Functional Limits for tasks assessed                      General Comments      Exercises Total Joint Exercises Ankle Circles/Pumps: AROM;Both;15 reps;Supine Quad Sets: AROM;Both;10 reps;Supine Heel Slides: AAROM;Right;15 reps;Supine Hip ABduction/ADduction: AAROM;Right;10 reps;Supine      Assessment/Plan    PT Assessment Patient needs continued PT services  PT Diagnosis Difficulty walking   PT Problem List Decreased  strength;Decreased range of motion;Decreased activity tolerance;Decreased mobility;Decreased knowledge of use of DME;Pain  PT Treatment Interventions DME instruction;Gait training;Stair training;Functional mobility training;Therapeutic activities;Therapeutic exercise;Patient/family education   PT Goals (Current goals can be found in the Care Plan section) Acute Rehab PT Goals Patient Stated Goal: Resume previous lifestyle with decreased pain PT Goal  Formulation: With patient Time For Goal Achievement: 06/04/14 Potential to Achieve Goals: Good    Frequency 7X/week   Barriers to discharge        Co-evaluation               End of Session Equipment Utilized During Treatment: Gait belt Activity Tolerance: Patient tolerated treatment well Patient left: in chair;with call bell/phone within reach Nurse Communication: Mobility status         Time: 9892-1194 PT Time Calculation (min) (ACUTE ONLY): 27 min   Charges:   PT Evaluation $Initial PT Evaluation Tier I: 1 Procedure PT Treatments $Gait Training: 8-22 mins $Therapeutic Exercise: 8-22 mins   PT G Codes:          [name] 05/28/2014, 5:46 PM

## 2014-05-28 NOTE — H&P (Signed)
TOTAL HIP ADMISSION H&P  Patient is admitted for right total hip arthroplasty.  Subjective:  Chief Complaint: right hip pain  HPI: Adam Perkins, 63 y.o. male, has a history of pain and functional disability in the right hip(s) due to avascular necrosis and osteoarthritis and patient has failed non-surgical conservative treatments for greater than 12 weeks to include NSAID's and/or analgesics, corticosteriod injections, flexibility and strengthening excercises and activity modification.  Onset of symptoms was abrupt starting 3 years ago with rapidlly worsening course since that time.The patient noted no past surgery on the right hip(s).  Patient currently rates pain in the right hip at 10 out of 10 with activity. Patient has night pain, worsening of pain with activity and weight bearing and pain that interfers with activities of daily living. Patient has evidence of subchondral cysts, subchondral sclerosis and joint space narrowing by imaging studies. This condition presents safety issues increasing the risk of falls.  There is no current active infection.  Patient Active Problem List   Diagnosis Date Noted  . Avascular necrosis of bone of right hip 05/28/2014  . Pain in limb 10/20/2013  . Osteoarthritis 06/03/2013  . Onychomycosis 06/03/2013  . Trochanteric bursitis of right hip 12/13/2012  . Diabetes 08/19/2012  . Hyperlipemia 08/19/2012  . Elevated blood pressure 08/19/2012   Past Medical History  Diagnosis Date  . Chicken pox   . Kidney stone     nephrolithiasis  . Hyperlipemia 08/19/2012  . Elevated blood pressure 08/19/2012  . Pain in limb 10/20/2013  . Arthritis     No past surgical history on file.  Prescriptions prior to admission  Medication Sig Dispense Refill Last Dose  . acetaminophen-codeine (TYLENOL #3) 300-30 MG per tablet Take 1 tablet by mouth every 8 (eight) hours as needed for moderate pain.   05/26/2014  . selenium 50 MCG TABS tablet Take 50 mcg by mouth daily.    05/27/2014 at am  . timolol (BETIMOL) 0.25 % ophthalmic solution Place 1 drop into both eyes 2 (two) times daily.   05/28/2014 at 0600  . ibuprofen (ADVIL,MOTRIN) 800 MG tablet Take 1 tablet (800 mg total) by mouth 3 (three) times daily. 90 tablet 1   . Naftifine HCl (NAFTIN) 2 % CREA Apply to affected areas of both feet twice daily as instructed 60 g 4 Taking  . predniSONE (DELTASONE) 5 MG tablet Begin taking 6 tablets daily, taper by one tablet daily until off the medication. 21 tablet 0    No Known Allergies  History  Substance Use Topics  . Smoking status: Current Some Day Smoker -- 20 years    Types: Cigarettes  . Smokeless tobacco: Never Used     Comment: quit 2012 after smoking on and off a little for years  . Alcohol Use: Yes     Comment: 2 glasses of wine daily     Family History  Problem Relation Age of Onset  . Arthritis Father   . Cancer Father 78    bladder cancer  . Diabetes Mother      Review of Systems  Musculoskeletal: Positive for joint pain.  All other systems reviewed and are negative.   Objective:  Physical Exam  Constitutional: He is oriented to person, place, and time. He appears well-developed and well-nourished.  HENT:  Head: Normocephalic and atraumatic.  Eyes: EOM are normal. Pupils are equal, round, and reactive to light.  Neck: Normal range of motion. Neck supple.  Cardiovascular: Normal rate and regular rhythm.  Respiratory: Effort normal and breath sounds normal.  GI: Soft. Bowel sounds are normal.  Musculoskeletal:       Right hip: He exhibits decreased range of motion, decreased strength and bony tenderness.  Neurological: He is alert and oriented to person, place, and time.  Skin: Skin is warm and dry.  Psychiatric: He has a normal mood and affect.    Vital signs in last 24 hours: Temp:  [97.9 F (36.6 C)] 97.9 F (36.6 C) (12/10 0655) Pulse Rate:  [71] 71 (12/10 0655) Resp:  [18] 18 (12/10 0655) BP: (151)/(76) 151/76 mmHg (12/10  0655) SpO2:  [98 %] 98 % (12/10 0655) Weight:  [83.915 kg (185 lb)] 83.915 kg (185 lb) (12/10 0728)  Labs:   Estimated body mass index is 27.31 kg/(m^2) as calculated from the following:   Height as of this encounter: 5\' 9"  (1.753 m).   Weight as of this encounter: 83.915 kg (185 lb).   Imaging Review Plain radiographs demonstrate severe avascular necrosis of the right hip.  Assessment/Plan:  End stage arthritis, right hip(s)  The patient history, physical examination, clinical judgement of the provider and imaging studies are consistent with end stage AVN of the right hip(s) and total hip arthroplasty is deemed medically necessary. The treatment options including medical management, injection therapy, arthroscopy and arthroplasty were discussed at length. The risks and benefits of total hip arthroplasty were presented and reviewed. The risks due to aseptic loosening, infection, stiffness, dislocation/subluxation,  thromboembolic complications and other imponderables were discussed.  The patient acknowledged the explanation, agreed to proceed with the plan and consent was signed. Patient is being admitted for inpatient treatment for surgery, pain control, PT, OT, prophylactic antibiotics, VTE prophylaxis, progressive ambulation and ADL's and discharge planning.The patient is planning to be discharged home with home health services

## 2014-05-28 NOTE — Plan of Care (Signed)
Problem: Phase I Progression Outcomes Goal: CMS/Neurovascular status WDL Outcome: Completed/Met Date Met:  05/28/14     

## 2014-05-28 NOTE — Brief Op Note (Signed)
05/28/2014  10:11 AM  PATIENT:  Adam Perkins  63 y.o. male  PRE-OPERATIVE DIAGNOSIS:  Avascular necrosis right hip  POST-OPERATIVE DIAGNOSIS:  Avascular necrosis right hip  PROCEDURE:  Procedure(s): RIGHT TOTAL HIP ARTHROPLASTY ANTERIOR APPROACH (Right)  SURGEON:  Surgeon(s) and Role:    * Mcarthur Rossetti, MD - Primary  PHYSICIAN ASSISTANT: Benita Stabile, PA-C  ANESTHESIA:   spinal  EBL:  Total I/O In: 2000 [I.V.:2000] Out: 500 [Urine:200; Blood:300]  BLOOD ADMINISTERED:none  DRAINS: none   LOCAL MEDICATIONS USED:  NONE  SPECIMEN:  No Specimen  DISPOSITION OF SPECIMEN:  N/A  COUNTS:  YES  TOURNIQUET:  * No tourniquets in log *  DICTATION: .Other Dictation: Dictation Number [id number]  PLAN OF CARE: Admit to inpatient   PATIENT DISPOSITION:  PACU - hemodynamically stable.   Delay start of Pharmacological VTE agent (>24hrs) due to surgical blood loss or risk of bleeding: no

## 2014-05-28 NOTE — Anesthesia Postprocedure Evaluation (Signed)
  Anesthesia Post-op Note  Patient: Adam Perkins  Procedure(s) Performed: Procedure(s) (LRB): RIGHT TOTAL HIP ARTHROPLASTY ANTERIOR APPROACH (Right)  Patient Location: PACU  Anesthesia Type: Spinal  Level of Consciousness: awake and alert   Airway and Oxygen Therapy: Patient Spontanous Breathing  Post-op Pain: mild  Post-op Assessment: Post-op Vital signs reviewed, Patient's Cardiovascular Status Stable, Respiratory Function Stable, Patent Airway and No signs of Nausea or vomiting  Last Vitals:  Filed Vitals:   05/28/14 1045  BP: 107/56  Pulse: 67  Temp:   Resp: 17    Post-op Vital Signs: stable   Complications: No apparent anesthesia complications

## 2014-05-28 NOTE — Progress Notes (Signed)
Utilization review completed.  

## 2014-05-28 NOTE — Op Note (Signed)
NAME:  Adam Perkins, Adam Perkins NO.:  [id number]  MEDICAL RECORD NO.:  [id number]  LOCATION:  [facility]                         FACILITY:  [facility]  PHYSICIAN:  Lind Guest. Ninfa Linden, M.D.DATE OF BIRTH:  26-Oct-1950  DATE OF PROCEDURE:  05/28/2014 DATE OF DISCHARGE:                              OPERATIVE REPORT   PREOPERATIVE DIAGNOSIS:  Avascular necrosis with superimposed osteoarthritis, right hip.  POSTOPERATIVE DIAGNOSIS:  Avascular necrosis with superimposed osteoarthritis, right hip.  PROCEDURE:  Right total hip arthroplasty through direct anterior approach.  IMPLANTS:  DePuy Sector Gription acetabular component size 52, size 36+ 4 neutral polyethylene liner, size 11 Corail femoral component with standard offset, size 36+ 1.5 ceramic hip ball.  SURGEON:  Lind Guest. Ninfa Linden, MD  ASSISTANT:  Erskine Emery, PA  ANESTHESIA:  Spinal.  BLOOD LOSS:  300 mL.  ANTIBIOTICS:  900 mg IV clindamycin.  COMPLICATIONS:  None.  INDICATIONS:  [name] is a very pleasant 63 year old gentleman with avascular necrosis and superimposed osteoarthritis of his right hip.  He has failed steroid injections in that hip.  His radiographs and MRI showed denuding of the cartilage of his right hip with superimposed avascular necrosis and osteoarthritis.  He has complete loss of the joint space.  The pain is daily, his mobility is limited, and his quality of life has been detrimentally impacted by this right hip.  With a very conservative treatment, he wished to proceed with a total hip arthroplasty.  He understands the risk of acute blood loss anemia, nerve and vessel injury, fracture, infection, dislocation, DVT.  He understands the goals are decreased pain, improved mobility, and overall improved quality of life.  PROCEDURE DESCRIPTION:  After informed consent was obtained, appropriate right hip was marked.  He was brought to the operating room while he was on a stretcher.   Spinal anesthesia was obtained.  He was then laid back in the supine position on the stretcher.  Foley catheter was placed in both feet and had traction boots applied to them.  Next, he was placed supine on the Hana fracture table with the perineal post in place and both legs in inline skeletal traction devices but no traction applied. His right operative hip was then prepped and draped with DuraPrep and sterile drapes.  A time-out was called and he was identified as correct patient, correct right hip.  We then made an incision inferior and posterior to the anterior-superior iliac spine and carried this obliquely down the leg.  We dissected down the tensor fascia lata muscle, and the tensor fascia was then divided longitudinally so we could proceed with a direct anterior approach to the hip.  We cauterized the lateral femoral circumflex vessels and I then opened up the hip capsule in an L type format after placing the Cobra retractors on the medial and lateral femoral neck.  We did find a large joint effusion with significant osteophytes all around the femoral head and femoral neck.  With Cobra retractors within the hip capsule, I used an oscillating saw to make my femoral neck cut proximal to the lesser trochanter and completed this with an osteotome.  I placed a corkscrew guide in the  femoral head and removed the femoral head in its entirety and found to be completely denuded and devoid of cartilage.  I then passed this off to the back and placed a bent Hohmann medially and a Cobra retractor laterally.  I cleaned the acetabulum, remnants of the acetabular labrum and debris.  I then began reaming under direct visualization from a size 42 reamer in 2 mm increments up to a size 52. The last reamer was also placed under direct fluoroscopy so we could obtain depth of  reaming, our inclination, and anteversion.  Once I was pleased with this, I placed the real DePuy Sector Gription  acetabular component in the apex hole eliminator.  I then placed the real 36+ 4 neutral polyethylene liner for the size 52 acetabular component. Attention was then turned to the femur.  With the leg externally rotated to about 100-110 degrees, extended and adducted, we were able to place a Mueller retractor medially and a Hohmann retractor behind the greater trochanter.  I released the lateral joint capsule and used a box cutting osteotome to enter the femoral canal and a rongeur to lateralize.  I then began broaching from a size 8 broach up to a size 11 using the Corail broaching system with a size 11 in place.  I used a calcar planer and then trialed a standard neck and a 36+ 1.5 hip ball.  We brought the leg back over and up with traction and internal rotation reducing the pelvis that was stable on rotation with minimal Shuck.  His offset and leg lengths were measured and near equal.  I then dislocated the hip and removed the trial components.  We placed the real DePuy Corail femoral component size 11 with standard offset and a real 36+ 1.5 ceramic hip ball.  We reduced this back in the acetabulum and it was stable.  We then copiously irrigated the soft tissues with normal saline solution, was able to close the joint capsule with interrupted #1 Ethibond suture followed by running #1 Vicryl in the tensor fascia, 0 Vicryl in the deep tissue, 2-0 Vicryl in subcutaneous tissue, 4-0 Monocryl subcuticular stitch, Steri-Strips and Aquacel dressing.  He was then taken off the Hana table into the recovery room in stable condition.  All final counts were correct, and there were no complications noted.  Of note, Erskine Emery, PA-C's assistance was crucial throughout this entire case and he facilitated the case significantly.     Lind Guest. Ninfa Linden, M.D.     CYB/MEDQ  D:  05/28/2014  T:  05/28/2014  Job:  [id number]

## 2014-05-28 NOTE — Anesthesia Procedure Notes (Signed)
Spinal Patient location during procedure: OR Start time: 05/28/2014 9:10 AM End time: 05/28/2014 9:12 AM Staffing Anesthesiologist: Milana Obey Performed by: anesthesiologist  Preanesthetic Checklist Completed: patient identified, site marked, surgical consent, pre-op evaluation, timeout performed, IV checked, risks and benefits discussed and monitors and equipment checked Spinal Block Patient position: sitting Prep: Betadine Patient monitoring: heart rate, continuous pulse ox and blood pressure Location: L3-4 Injection technique: single-shot Needle Needle type: Sprotte  Needle gauge: 24 G Needle length: 9 cm Assessment Sensory level: T8 Additional Notes Expiration date of kit checked and confirmed. Patient tolerated procedure well, without complications.

## 2014-05-29 ENCOUNTER — Encounter (HOSPITAL_COMMUNITY): Payer: Self-pay | Admitting: Orthopaedic Surgery

## 2014-05-29 LAB — BASIC METABOLIC PANEL
ANION GAP: 12 (ref 5–15)
BUN: 10 mg/dL (ref 6–23)
CALCIUM: 8.6 mg/dL (ref 8.4–10.5)
CO2: 23 mEq/L (ref 19–32)
Chloride: 100 mEq/L (ref 96–112)
Creatinine, Ser: 0.65 mg/dL (ref 0.50–1.35)
GFR calc Af Amer: >90 mL/min (ref >90)
GLUCOSE: 127 mg/dL — AB (ref 70–99)
POTASSIUM: 3.8 meq/L (ref 3.7–5.3)
SODIUM: 135 meq/L — AB (ref 137–147)

## 2014-05-29 LAB — CBC
HCT: 36.7 % — ABNORMAL LOW (ref 39.0–52.0)
HEMOGLOBIN: 12 g/dL — AB (ref 13.0–17.0)
MCH: 30.9 pg (ref 26.0–34.0)
MCHC: 32.7 g/dL (ref 30.0–36.0)
MCV: 94.6 fL (ref 78.0–100.0)
PLATELETS: 208 10*3/uL (ref 150–400)
RBC: 3.88 MIL/uL — ABNORMAL LOW (ref 4.22–5.81)
RDW: 12.5 % (ref 11.5–15.5)
WBC: 6.5 10*3/uL (ref 4.0–10.5)

## 2014-05-29 MED ORDER — OXYCODONE-ACETAMINOPHEN 5-325 MG PO TABS: 1.0000 | ORAL_TABLET | ORAL | Status: DC | PRN

## 2014-05-29 MED ORDER — METHOCARBAMOL 500 MG PO TABS: 500.0000 mg | ORAL_TABLET | Freq: Four times a day (QID) | ORAL | Status: DC | PRN

## 2014-05-29 MED ORDER — DSS 100 MG PO CAPS: 100.0000 mg | ORAL_CAPSULE | Freq: Two times a day (BID) | ORAL | Status: DC | PRN

## 2014-05-29 MED ORDER — ASPIRIN 325 MG PO TBEC: 325.0000 mg | DELAYED_RELEASE_TABLET | Freq: Two times a day (BID) | ORAL | Status: DC

## 2014-05-29 NOTE — Progress Notes (Signed)
Subjective: 1 Day Post-Op Procedure(s) (LRB): RIGHT TOTAL HIP ARTHROPLASTY ANTERIOR APPROACH (Right) Patient reports pain as mild.    Objective: Vital signs in last 24 hours: Temp:  [97.4 F (36.3 C)-98.7 F (37.1 C)] 98.7 F (37.1 C) (12/11 0601) Pulse Rate:  [54-82] 80 (12/11 0601) Resp:  [12-19] 16 (12/11 0601) BP: (106-163)/(56-85) 148/76 mmHg (12/11 0601) SpO2:  [97 %-100 %] 97 % (12/11 0601) Weight:  [83.915 kg (185 lb)] 83.915 kg (185 lb) (12/10 1230)  Intake/Output from previous day: 12/10 0701 - 12/11 0700 In: 5295 [P.O.:600; I.V.:4585; IV Piggyback:110] Out: 2425 [Urine:2125; Blood:300] Intake/Output this shift: Total I/O In: 240 [P.O.:240] Out: 75 [Urine:75]   Recent Labs  05/29/14 0455  HGB 12.0*    Recent Labs  05/29/14 0455  WBC 6.5  RBC 3.88*  HCT 36.7*  PLT 208    Recent Labs  05/29/14 0455  NA 135*  K 3.8  CL 100  CO2 23  BUN 10  CREATININE 0.65  GLUCOSE 127*  CALCIUM 8.6   No results for input(s): LABPT, INR in the last 72 hours.  Sensation intact distally Intact pulses distally Dorsiflexion/Plantar flexion intact Incision: scant drainage  Assessment/Plan: 1 Day Post-Op Procedure(s) (LRB): RIGHT TOTAL HIP ARTHROPLASTY ANTERIOR APPROACH (Right) Up with therapy Discharge home with home health today  Adam Perkins 05/29/2014, 9:43 AM

## 2014-05-29 NOTE — Discharge Summary (Signed)
Patient ID: MARVEN VELEY MRN: [id number] DOB/AGE: 1950/10/21 63 y.o.  Admit date: 05/28/2014 Discharge date: 05/29/2014  Admission Diagnoses:  Principal Problem:   Avascular necrosis of bone of right hip Active Problems:   Status post total replacement of right hip   Discharge Diagnoses:  Same  Past Medical History  Diagnosis Date  . Chicken pox   . Kidney stone     nephrolithiasis  . Hyperlipemia 08/19/2012  . Elevated blood pressure 08/19/2012  . Pain in limb 10/20/2013  . Arthritis     Surgeries: Procedure(s): RIGHT TOTAL HIP ARTHROPLASTY ANTERIOR APPROACH on 05/28/2014   Consultants:    Discharged Condition: Improved  Hospital Course: LOTT SEELBACH is an 64 y.o. male who was admitted 05/28/2014 for operative treatment ofAvascular necrosis of bone of right hip. Patient has severe unremitting pain that affects sleep, daily activities, and work/hobbies. After pre-op clearance the patient was taken to the operating room on 05/28/2014 and underwent  Procedure(s): RIGHT TOTAL HIP ARTHROPLASTY ANTERIOR APPROACH.    Patient was given perioperative antibiotics: Anti-infectives    Start     Dose/Rate Route Frequency Ordered Stop   05/28/14 1500  ceFAZolin (ANCEF) IVPB 1 g/50 mL premix     1 g100 mL/hr over 30 Minutes Intravenous Every 6 hours 05/28/14 1243 05/28/14 2108   05/28/14 0653  ceFAZolin (ANCEF) IVPB 2 g/50 mL premix     2 g100 mL/hr over 30 Minutes Intravenous On call to O.R. 05/28/14 8466 05/28/14 0901       Patient was given sequential compression devices, early ambulation, and chemoprophylaxis to prevent DVT.  Patient benefited maximally from hospital stay and there were no complications.    Recent vital signs: Patient Vitals for the past 24 hrs:  BP Temp Temp src Pulse Resp SpO2 Height Weight  05/29/14 0601 (!) 148/76 mmHg 98.7 F (37.1 C) Oral 80 16 97 % - -  05/29/14 0235 (!) 143/69 mmHg 98.7 F (37.1 C) Oral 79 16 98 % - -  05/28/14 2100 (!) 146/74 mmHg  98.3 F (36.8 C) Oral 82 16 99 % - -  05/28/14 1832 (!) 163/78 mmHg 98.6 F (37 C) Oral 68 16 98 % - -  05/28/14 1530 (!) 148/77 mmHg 98.5 F (36.9 C) Oral 72 16 100 % - -  05/28/14 1444 (!) 159/81 mmHg 97.9 F (36.6 C) Oral 73 16 100 % - -  05/28/14 1332 140/73 mmHg 97.7 F (36.5 C) Oral 67 14 100 % - -  05/28/14 1230 (!) 153/85 mmHg 97.5 F (36.4 C) Oral (!) 58 - 100 % 5\' 3"  (1.6 m) 83.915 kg (185 lb)  05/28/14 1215 131/72 mmHg - - (!) 54 12 100 % - -  05/28/14 1200 131/72 mmHg - - (!) 56 12 100 % - -  05/28/14 1145 125/72 mmHg 97.4 F (36.3 C) - (!) 57 12 100 % - -  05/28/14 1130 126/67 mmHg - - (!) 56 18 100 % - -  05/28/14 1115 115/65 mmHg - - 60 16 100 % - -  05/28/14 1100 112/62 mmHg - - 63 19 100 % - -  05/28/14 1045 (!) 107/56 mmHg - - 67 17 100 % - -  05/28/14 1041 (!) 106/56 mmHg 97.4 F (36.3 C) - 69 15 100 % - -     Recent laboratory studies:  Recent Labs  05/29/14 0455  WBC 6.5  HGB 12.0*  HCT 36.7*  PLT 208  NA 135*  K 3.8  CL 100  CO2 23  BUN 10  CREATININE 0.65  GLUCOSE 127*  CALCIUM 8.6     Discharge Medications:     Medication List    STOP taking these medications        acetaminophen-codeine 300-30 MG per tablet  Commonly known as:  TYLENOL #3     ibuprofen 800 MG tablet  Commonly known as:  ADVIL,MOTRIN      TAKE these medications        aspirin 325 MG EC tablet  Take 1 tablet (325 mg total) by mouth 2 (two) times daily after a meal.     DSS 100 MG Caps  Take 100 mg by mouth 2 (two) times daily as needed for mild constipation.     methocarbamol 500 MG tablet  Commonly known as:  ROBAXIN  Take 1 tablet (500 mg total) by mouth every 6 (six) hours as needed for muscle spasms.     Naftifine HCl 2 % Crea  Commonly known as:  NAFTIN  Apply to affected areas of both feet twice daily as instructed     oxyCODONE-acetaminophen 5-325 MG per tablet  Commonly known as:  ROXICET  Take 1-2 tablets by mouth every 4 (four) hours as needed.      predniSONE 5 MG tablet  Commonly known as:  DELTASONE  Begin taking 6 tablets daily, taper by one tablet daily until off the medication.     selenium 50 MCG Tabs tablet  Take 50 mcg by mouth daily.     timolol 0.25 % ophthalmic solution  Commonly known as:  BETIMOL  Place 1 drop into both eyes 2 (two) times daily.        Diagnostic Studies: Dg Hip Complete Right  05/28/2014   CLINICAL DATA:  63 year old male status post right hip arthroplasty  EXAM: DG C-ARM 1-60 MIN - NRPT [facility]; RIGHT HIP - COMPLETE 2+ VIEW  COMPARISON:  Preoperative MRI 04/22/2014  FINDINGS: Intraoperative spot radiographs demonstrate interval surgical changes of a right hip arthroplasty. No evidence of immediate hardware complication.  IMPRESSION: Right hip arthroplasty without evidence of immediate complication.   Electronically Signed   By: Jacqulynn Cadet M.D.   On: 05/28/2014 10:23   Dg Pelvis Portable  05/28/2014   CLINICAL DATA:  Status post right total hip arthroplasty.  EXAM: PORTABLE PELVIS 1-2 VIEWS  COMPARISON:  Oct 20, 2013.  FINDINGS: The right acetabular and femoral components appear well situated. No fracture or dislocation is noted.  IMPRESSION: Status post right total hip arthroplasty.   Electronically Signed   By: Sabino Dick M.D.   On: 05/28/2014 11:12   Dg Hip Portable 1 View Right  05/28/2014   CLINICAL DATA:  Status post right total hip arthroplasty.  EXAM: PORTABLE RIGHT HIP - 1 VIEW  COMPARISON:  Same day.  FINDINGS: Single cross-table portable lateral projection of the right hip demonstrates the femoral and acetabular components to be well situated.  IMPRESSION: Status post right total hip arthroplasty.   Electronically Signed   By: Sabino Dick M.D.   On: 05/28/2014 11:11   Dg C-arm 1-60 Min-no Report  05/28/2014   CLINICAL DATA:  63 year old male status post right hip arthroplasty  EXAM: DG C-ARM 1-60 MIN - [facility]; RIGHT HIP - COMPLETE 2+ VIEW  COMPARISON:  Preoperative MRI  04/22/2014  FINDINGS: Intraoperative spot radiographs demonstrate interval surgical changes of a right hip arthroplasty. No evidence of immediate hardware complication.  IMPRESSION: Right hip arthroplasty without evidence  of immediate complication.   Electronically Signed   By: Jacqulynn Cadet M.D.   On: 05/28/2014 10:23    Disposition:  To home      Discharge Instructions    Call MD / Call 911    Complete by:  As directed   If you experience chest pain or shortness of breath, CALL 911 and be transported to the hospital emergency room.  If you develope a fever above 101 F, pus (white drainage) or increased drainage or redness at the wound, or calf pain, call your surgeon's office.     Constipation Prevention    Complete by:  As directed   Drink plenty of fluids.  Prune juice may be helpful.  You may use a stool softener, such as Colace (over the counter) 100 mg twice a day.  Use MiraLax (over the counter) for constipation as needed.     Diet - low sodium heart healthy    Complete by:  As directed      Discharge instructions    Complete by:  As directed   Increase activities as comfort allows. Expect right thigh swelling and pain as well as knee pain; ice as needed. You can get your actual dressing wet daily in the shower.     Discharge patient    Complete by:  As directed      Increase activity slowly as tolerated    Complete by:  As directed            Follow-up Information    Follow up with Mcarthur Rossetti, MD In 2 weeks.   Specialty:  Orthopedic Surgery   Contact information:   [place] [address] [phone]        Signed: Mcarthur Rossetti 05/29/2014, 9:46 AM

## 2014-05-29 NOTE — Plan of Care (Signed)
Problem: Consults Goal: Total Joint Replacement Patient Education See Patient Education Module for education specifics.  Outcome: Completed/Met Date Met:  05/29/14 Goal: Diagnosis- Total Joint Replacement Outcome: Completed/Met Date Met:  05/29/14 Primary Total Hip RIGHT, Anterior Goal: Skin Care Protocol Initiated - if Braden Score 18 or less If consults are not indicated, leave blank or document N/A  Outcome: Not Applicable Date Met:  88/45/73 Goal: Nutrition Consult-if indicated Outcome: Not Applicable Date Met:  34/48/30 Goal: Diabetes Guidelines if Diabetic/Glucose > 140 If diabetic or lab glucose is > 140 mg/dl - Initiate Diabetes/Hyperglycemia Guidelines & Document Interventions  Outcome: Not Applicable Date Met:  15/99/68  Problem: Phase I Progression Outcomes Goal: Pain controlled with appropriate interventions Outcome: Completed/Met Date Met:  05/29/14 Goal: Dangle or out of bed evening of surgery Outcome: Completed/Met Date Met:  05/29/14 Goal: Initial discharge plan identified Outcome: Completed/Met Date Met:  05/29/14 Goal: Other Phase I Outcomes/Goals Outcome: Not Applicable Date Met:  95/70/22  Problem: Phase II Progression Outcomes Goal: Ambulates Outcome: Completed/Met Date Met:  05/29/14 Goal: Tolerating diet Outcome: Completed/Met Date Met:  05/29/14 Goal: Discharge plan established Outcome: Completed/Met Date Met:  05/29/14 Goal: Other Phase II Outcomes/Goals Outcome: Not Applicable Date Met:  02/66/91  Problem: Phase III Progression Outcomes Goal: Pain controlled on oral analgesia Outcome: Completed/Met Date Met:  05/29/14 Goal: Ambulates Outcome: Completed/Met Date Met:  05/29/14 Goal: Anticoagulant follow-up in place Outcome: Not Applicable Date Met:  67/56/12 Xarelto VTE, no f/u needed.

## 2014-05-29 NOTE — Progress Notes (Signed)
Physical Therapy Treatment Patient Details Name: Adam Perkins MRN: [id number] DOB: 23-Jun-1950 Today's Date: 06-23-2014    History of Present Illness R THR -     PT Comments    Progressing well and hoping for d/c this pm  Follow Up Recommendations  Home health PT     Equipment Recommendations  Rolling walker with 5" wheels    Recommendations for Other Services OT consult     Precautions / Restrictions Precautions Precautions: Fall Restrictions Weight Bearing Restrictions: No Other Position/Activity Restrictions: WBAT    Mobility  Bed Mobility                  Transfers Overall transfer level: Needs assistance Equipment used: Rolling walker (2 wheeled) Transfers: Sit to/from Stand Sit to Stand: Min guard         General transfer comment: cues for LE management and use of UEs to self assist  Ambulation/Gait Ambulation/Gait assistance: Min guard Ambulation Distance (Feet): 200 Feet Assistive device: Rolling walker (2 wheeled) Gait Pattern/deviations: Step-to pattern;Step-through pattern;Decreased step length - right;Decreased step length - left;Shuffle     General Gait Details: cues for posture, position from RW and sequence   Stairs            Wheelchair Mobility    Modified Rankin (Stroke Patients Only)       Balance                                    Cognition Arousal/Alertness: Awake/alert Behavior During Therapy: WFL for tasks assessed/performed Overall Cognitive Status: Within Functional Limits for tasks assessed                      Exercises Total Joint Exercises Ankle Circles/Pumps: AROM;Both;15 reps;Supine Quad Sets: AROM;Both;10 reps;Supine Gluteal Sets: AROM;Both;10 reps;Supine Heel Slides: AAROM;Right;Supine;20 reps Hip ABduction/ADduction: AAROM;Right;Supine;15 reps Long Arc Quad: AROM;Both;15 reps;Seated    General Comments        Pertinent Vitals/Pain Pain Assessment: 0-10 Pain  Score: 2  Pain Location: R hip/thigh Pain Descriptors / Indicators: Aching;Sore Pain Intervention(s): Limited activity within patient's tolerance;Monitored during session;Premedicated before session;Ice applied    Home Living                      Prior Function            PT Goals (current goals can now be found in the care plan section) Acute Rehab PT Goals Patient Stated Goal: Resume previous lifestyle with decreased pain PT Goal Formulation: With patient Time For Goal Achievement: 06/04/14 Potential to Achieve Goals: Good Progress towards PT goals: Progressing toward goals    Frequency  7X/week    PT Plan Current plan remains appropriate    Co-evaluation             End of Session Equipment Utilized During Treatment: Gait belt Activity Tolerance: Patient tolerated treatment well Patient left: in chair;with call bell/phone within reach     Time: 0800-0832 PT Time Calculation (min) (ACUTE ONLY): 32 min  Charges:  $Gait Training: 8-22 mins $Therapeutic Exercise: 8-22 mins                    G Codes:      Trenese Haft 06/23/14, 8:49 AM

## 2014-05-29 NOTE — Care Management Note (Signed)
    Page 1 of 2   05/29/2014     10:11:42 AM CARE MANAGEMENT NOTE 05/29/2014  Patient:  Adam Perkins, Adam Perkins   Account Number:  [id number]  Date Initiated:  05/29/2014  Documentation initiated by:  Sunday Spillers  Subjective/Objective Assessment:   63 yo male admitted s/p Right total hip arthroplasty through direct anterior  approach. PTA lived at home with spouse.     Action/Plan:   Home when stable   Anticipated DC Date:  05/29/2014   Anticipated DC Plan:  [place]  CM consult      [place]   Choice offered to / List presented to:  C-3 Spouse   DME arranged  3-N-1  [facility]assie Moselle      DME agency  [place] arranged  [place].   Status of service:  Completed, signed off Medicare Important Message given?   (If response is "NO", the following Medicare IM given date fields will be blank) Date Medicare IM given:   Medicare IM given by:   Date Additional Medicare IM given:   Additional Medicare IM given by:    Discharge Disposition:  [place]  Per UR Regulation:  Reviewed for med. necessity/level of care/duration of stay  If discussed at [place] of Stay Meetings, dates discussed:    Comments:  05-29-14 Sunday Spillers RN CM 1000 Spoke with patient and spouse at bedside. Chose [facility] for [facility] services. Contacted [facility] to arrange. Orders placed. DME per request.

## 2014-05-29 NOTE — Evaluation (Signed)
Occupational Therapy Evaluation Patient Details Name: Adam Perkins MRN: [id number] DOB: 1951/03/13 Today's Date: 05/29/2014    History of Present Illness R THR -    Clinical Impression   This 63 year old man was admitted for R DA THA.  All education was completed, and pt will not need further OT.  Reinforced working within pain-free tolerance and not overdoing it.  Educated to keep walker in front of him for safety.    Follow Up Recommendations  No OT follow up    Equipment Recommendations  3 in 1 bedside comode    Recommendations for Other Services       Precautions / Restrictions Precautions Precautions: Fall Restrictions Weight Bearing Restrictions: No Other Position/Activity Restrictions: WBAT      Mobility Bed Mobility                  Transfers Overall transfer level: Needs assistance Equipment used: Rolling walker (2 wheeled) Transfers: Sit to/from Stand Sit to Stand: Supervision         General transfer comment: cues for UE placement    Balance                                            ADL Overall ADL's : Needs assistance/impaired     Grooming: Oral care;Supervision/safety;Standing                   Toilet Transfer: Ambulation;Comfort height toilet;Grab bars;Min guard   Toileting- Clothing Manipulation and Hygiene: Supervision/safety;Sit to/from stand   Tub/ Shower Transfer: Ambulation;Walk-in shower;Min guard     General ADL Comments: pt needs mod A for LB bathing and max A for LB dressing.  Educated on AE, but pt feels he will have his wife assist him at home.  He states his toilet is lower than a standard commode.  He will benefit from 3:1 to place over commode and also to double as a shower seat.  Pt tends to move quickly     Vision                     Perception     Praxis      Pertinent Vitals/Pain Pain Assessment: 0-10 Pain Score: 2  Pain Location: R hip thigh Pain Descriptors /  Indicators: Burning Pain Intervention(s): Limited activity within patient's tolerance;Monitored during session;Premedicated before session;Repositioned;Ice applied     Hand Dominance     Extremity/Trunk Assessment Upper Extremity Assessment Upper Extremity Assessment: Overall WFL for tasks assessed           Communication Communication Communication: No difficulties   Cognition Arousal/Alertness: Awake/alert Behavior During Therapy: WFL for tasks assessed/performed Overall Cognitive Status: Within Functional Limits for tasks assessed                     General Comments       Exercises      Shoulder Instructions      Home Living Family/patient expects to be discharged to:: Private residence Living Arrangements: Spouse/significant other Available Help at Discharge: Family               Bathroom Shower/Tub: Walk-in Psychologist, prison and probation services: Standard     Home Equipment: Cane - single point          Prior Functioning/Environment Level of Independence: Independent;Independent with assistive device(s)  OT Diagnosis: Acute pain   OT Problem List:     OT Treatment/Interventions:      OT Goals(Current goals can be found in the care plan section) Acute Rehab OT Goals Patient Stated Goal: Resume previous lifestyle with decreased pain  OT Frequency:     Barriers to D/C:            Co-evaluation              End of Session    Activity Tolerance: Patient tolerated treatment well Patient left: in chair;with call bell/phone within reach   Time: 0925-0942 OT Time Calculation (min): 17 min Charges:  OT General Charges $OT Visit: 1 Procedure OT Evaluation $Initial OT Evaluation Tier I: 1 Procedure OT Treatments $Self Care/Home Management : 8-22 mins G-Codes:    Tanetta Fuhriman 06/24/2014, 10:37 AM Lesle Chris, OTR/L [phone] 06/24/2014

## 2015-05-17 IMAGING — DX DG PORTABLE PELVIS
1 series · 1 of 1 positions shown · non-contrast
Comparison: October 20, 2013.

CLINICAL DATA: Status post right total hip arthroplasty.

EXAM:
PORTABLE PELVIS 1-2 VIEWS

[AP]
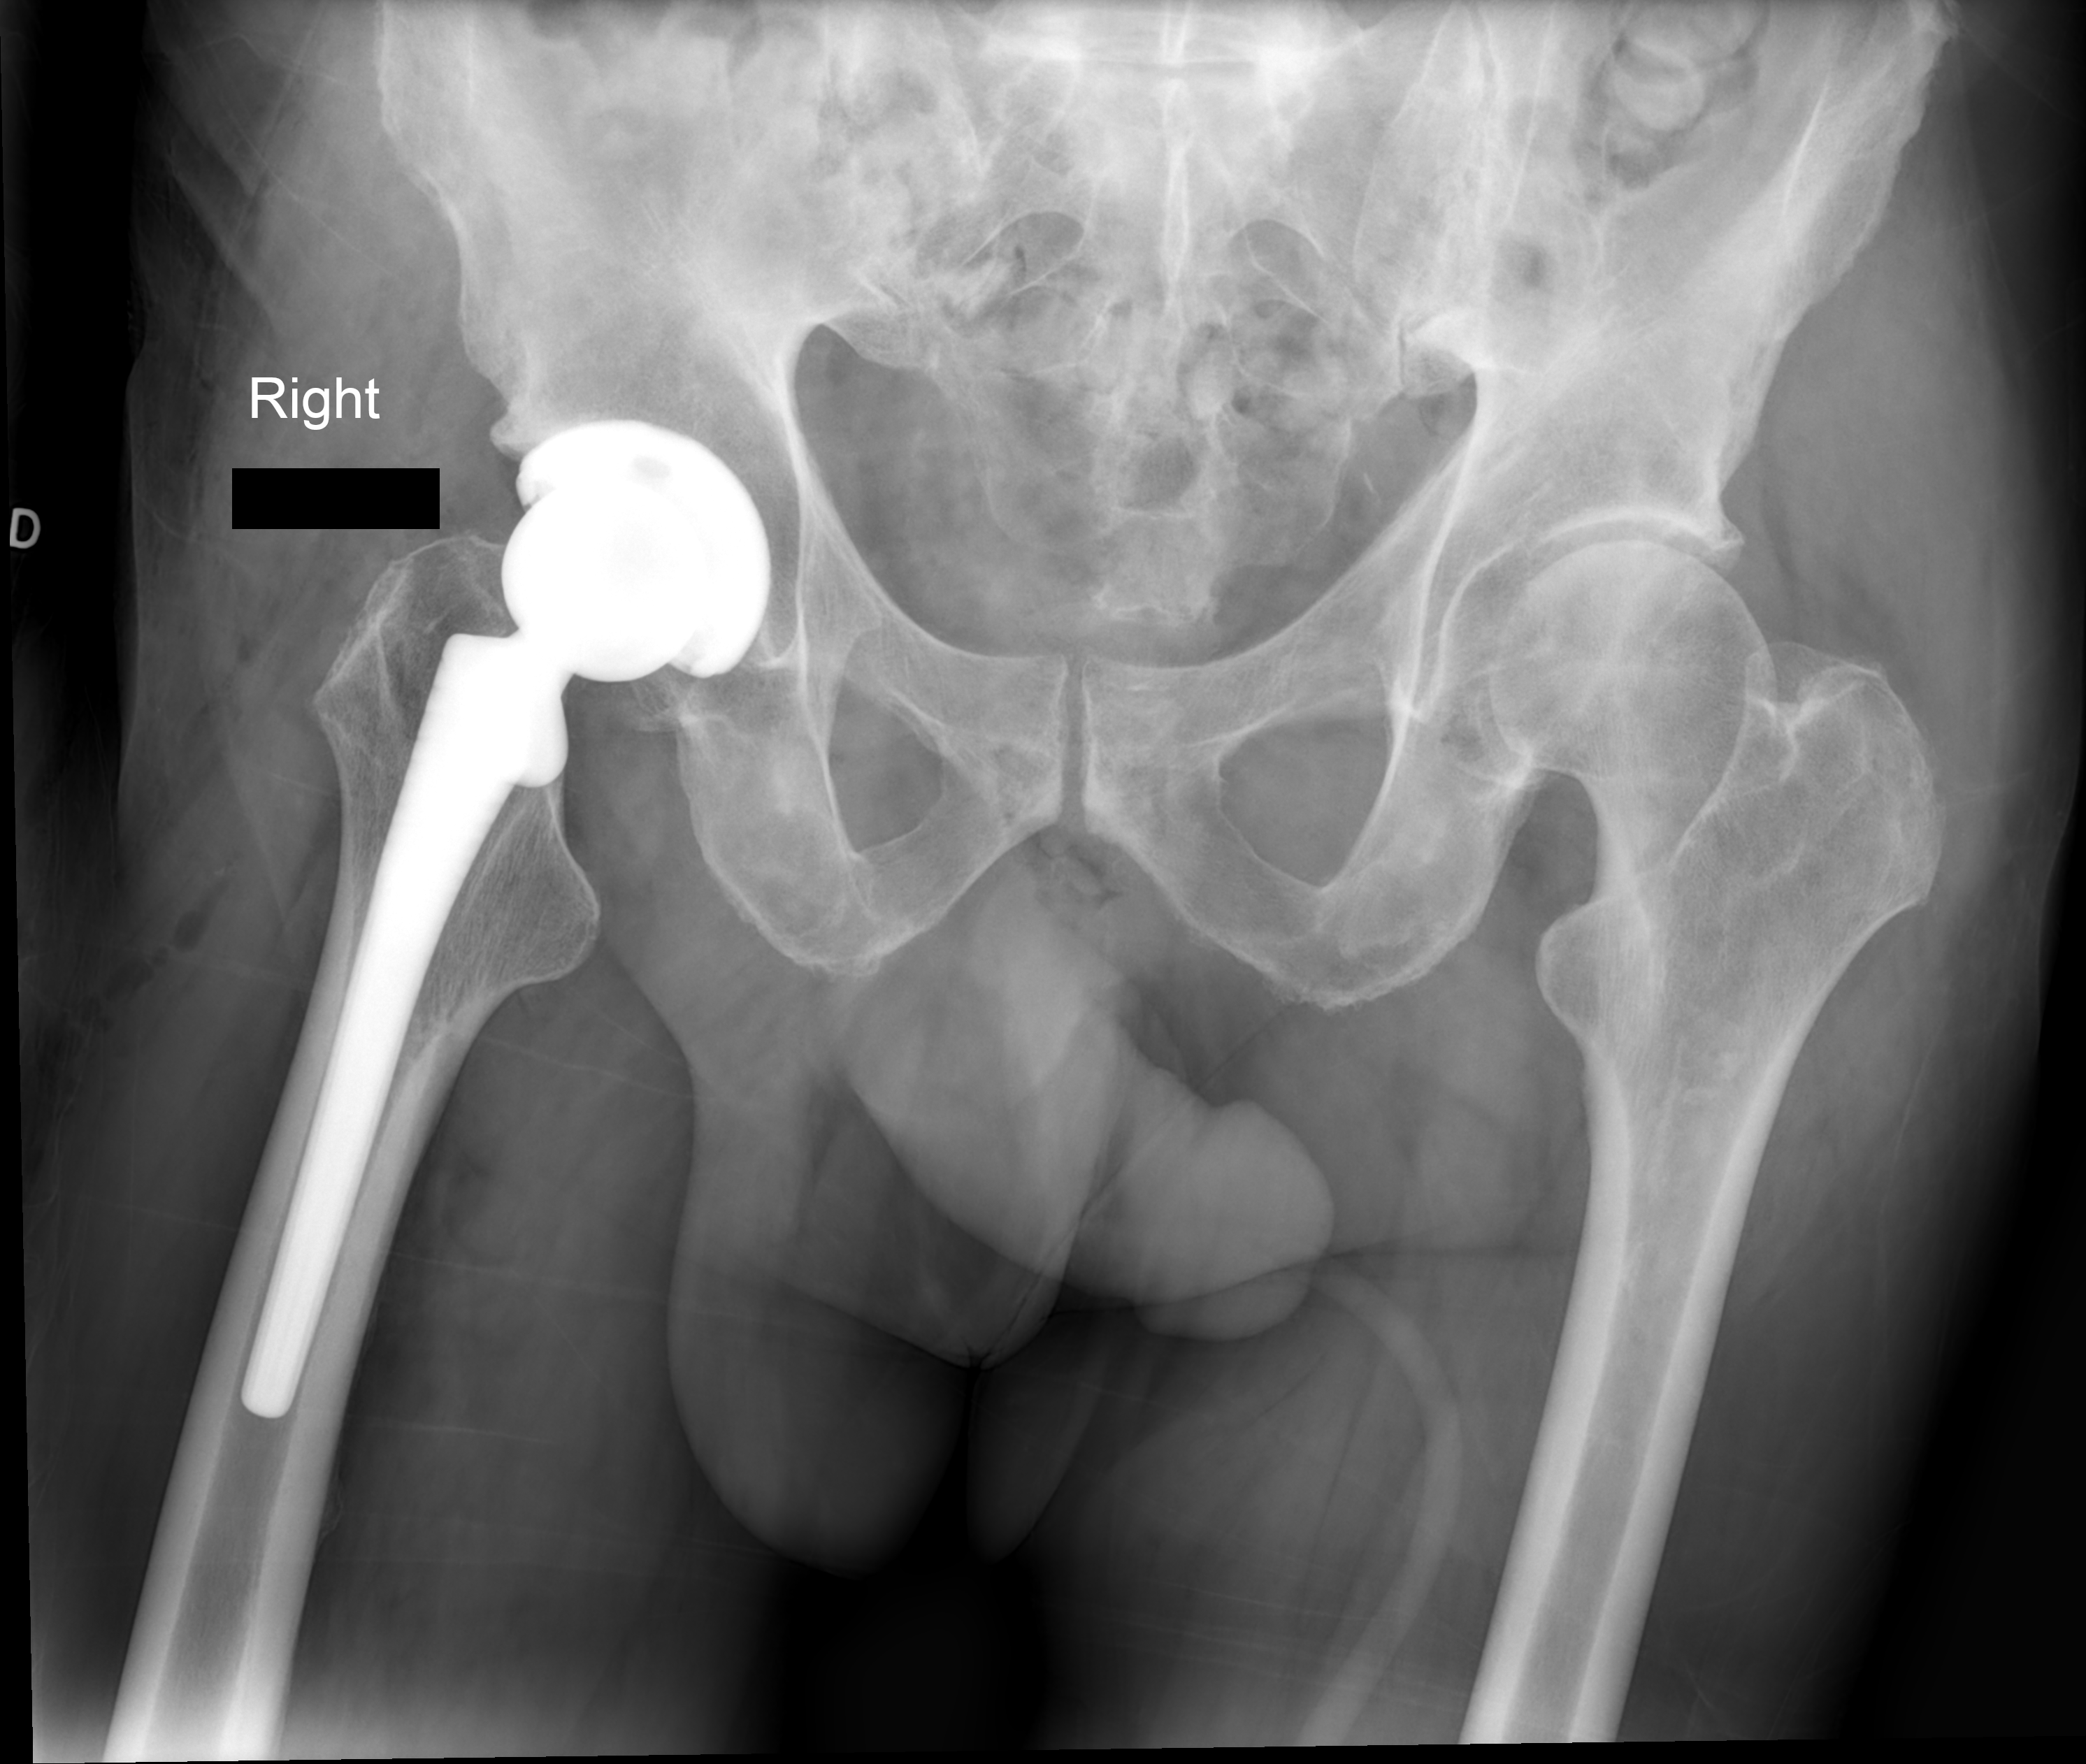

[1 of 1 positions shown; findings below may reference images not displayed]

FINDINGS: The right acetabular and femoral components appear well situated. No
fracture or dislocation is noted.
IMPRESSION: Status post right total hip arthroplasty.

## 2015-07-02 MED FILL — TIMOLOL 0.5% EYE DROPS: 0.5 | 30 days supply | Qty: 5 | Fill #1

## 2015-09-03 MED FILL — TIMOLOL 0.5% EYE DROPS: 0.5 | 25 days supply | Qty: 5 | Fill #0

## 2015-10-31 ENCOUNTER — Encounter (HOSPITAL_COMMUNITY): Payer: Self-pay

## 2015-10-31 ENCOUNTER — Emergency Department (HOSPITAL_COMMUNITY)
Admission: EM | Admit: 2015-10-31 | Discharge: 2015-10-31 | Disposition: A | Discharge status: Home / Self Care | Payer: 59 | Attending: Emergency Medicine | Admitting: Emergency Medicine

## 2015-10-31 ENCOUNTER — Encounter (HOSPITAL_COMMUNITY): Payer: Self-pay | Admitting: Emergency Medicine

## 2015-10-31 ENCOUNTER — Ambulatory Visit (HOSPITAL_COMMUNITY)
Admission: EM | Admit: 2015-10-31 | Discharge: 2015-10-31 | Disposition: A | Discharge status: Nursing Home (Includes ICF) | Payer: 59 | Attending: Emergency Medicine | Admitting: Emergency Medicine

## 2015-10-31 DIAGNOSIS — Z87442 Personal history of urinary calculi: Secondary | ICD-10-CM | POA: Diagnosis not present

## 2015-10-31 DIAGNOSIS — R197 Diarrhea, unspecified: Secondary | ICD-10-CM | POA: Diagnosis not present

## 2015-10-31 DIAGNOSIS — Z7952 Long term (current) use of systemic steroids: Secondary | ICD-10-CM | POA: Insufficient documentation

## 2015-10-31 DIAGNOSIS — Z8639 Personal history of other endocrine, nutritional and metabolic disease: Secondary | ICD-10-CM | POA: Insufficient documentation

## 2015-10-31 DIAGNOSIS — Z7982 Long term (current) use of aspirin: Secondary | ICD-10-CM | POA: Insufficient documentation

## 2015-10-31 DIAGNOSIS — R1032 Left lower quadrant pain: Secondary | ICD-10-CM

## 2015-10-31 DIAGNOSIS — Z8619 Personal history of other infectious and parasitic diseases: Secondary | ICD-10-CM | POA: Insufficient documentation

## 2015-10-31 DIAGNOSIS — R109 Unspecified abdominal pain: Secondary | ICD-10-CM | POA: Diagnosis not present

## 2015-10-31 DIAGNOSIS — F17210 Nicotine dependence, cigarettes, uncomplicated: Secondary | ICD-10-CM | POA: Diagnosis not present

## 2015-10-31 DIAGNOSIS — M1990 Unspecified osteoarthritis, unspecified site: Secondary | ICD-10-CM | POA: Diagnosis not present

## 2015-10-31 DIAGNOSIS — Z79899 Other long term (current) drug therapy: Secondary | ICD-10-CM | POA: Diagnosis not present

## 2015-10-31 MED ORDER — SODIUM CHLORIDE 0.9 % IV SOLN
Freq: Once | INTRAVENOUS | Status: AC
  Administered 2015-10-31: 17:00:00 via INTRAVENOUS

## 2015-10-31 MED ORDER — MORPHINE SULFATE (PF) 2 MG/ML IV SOLN
INTRAVENOUS | Status: AC
  Filled 2015-10-31: qty 1

## 2015-10-31 MED ORDER — ONDANSETRON HCL 4 MG/2ML IJ SOLN
4.0000 mg | Freq: Once | INTRAMUSCULAR | Status: DC
Start: 2015-10-31 — End: 2015-10-31

## 2015-10-31 MED ORDER — ONDANSETRON HCL 4 MG/2ML IJ SOLN
INTRAMUSCULAR | Status: AC
  Filled 2015-10-31: qty 2

## 2015-10-31 MED ORDER — ONDANSETRON HCL 4 MG/2ML IJ SOLN
4.0000 mg | Freq: Once | INTRAMUSCULAR | Status: AC
  Administered 2015-10-31: 4 mg via INTRAVENOUS

## 2015-10-31 MED ORDER — MORPHINE SULFATE (PF) 2 MG/ML IV SOLN
4.0000 mg | Freq: Once | INTRAVENOUS | Status: AC
  Administered 2015-10-31: 4 mg via INTRAVENOUS

## 2015-10-31 NOTE — ED Notes (Signed)
[facility] advised no truck available. [facility] contacted and will have a non-emergency response.

## 2015-10-31 NOTE — Discharge Instructions (Signed)
Return to the emergency department for evaluation if you develop fever, intractable nausea and vomiting, blood in your stool, passing out, or worsening abdominal pain.  Abdominal Pain, Adult Many things can cause abdominal pain. Usually, abdominal pain is not caused by a disease and will improve without treatment. It can often be observed and treated at home. Your health care provider will do a physical exam and possibly order blood tests and X-rays to help determine the seriousness of your pain. However, in many cases, more time must pass before a clear cause of the pain can be found. Before that point, your health care provider may not know if you need more testing or further treatment. HOME CARE INSTRUCTIONS Monitor your abdominal pain for any changes. The following actions may help to alleviate any discomfort you are experiencing:  Only take over-the-counter or prescription medicines as directed by your health care provider.  Do not take laxatives unless directed to do so by your health care provider.  Try a clear liquid diet (broth, tea, or water) as directed by your health care provider. Slowly move to a bland diet as tolerated. SEEK MEDICAL CARE IF:  You have unexplained abdominal pain.  You have abdominal pain associated with nausea or diarrhea.  You have pain when you urinate or have a bowel movement.  You experience abdominal pain that wakes you in the night.  You have abdominal pain that is worsened or improved by eating food.  You have abdominal pain that is worsened with eating fatty foods.  You have a fever. SEEK IMMEDIATE MEDICAL CARE IF:  Your pain does not go away within 2 hours.  You keep throwing up (vomiting).  Your pain is felt only in portions of the abdomen, such as the right side or the left lower portion of the abdomen.  You pass bloody or black tarry stools. MAKE SURE YOU:  Understand these instructions.  Will watch your condition.  Will get help  right away if you are not doing well or get worse.   This information is not intended to replace advice given to you by your health care provider. Make sure you discuss any questions you have with your health care provider.   Document Released: 03/15/2005 Document Revised: 02/24/2015 Document Reviewed: 02/12/2013 Elsevier Interactive Patient Education [employer].

## 2015-10-31 NOTE — ED Provider Notes (Signed)
CSN: [id number]     Arrival date & time 10/31/15  1707 History   First MD Initiated Contact with Patient 10/31/15 1721     Chief Complaint  Patient presents with  . Abdominal Pain     (Consider location/radiation/quality/duration/timing/severity/associated sxs/prior Treatment) HPI Patient is a 65 year old male with past medical history of kidney stones presents from urgent care for evaluation of left lower quadrant abdominal pain. Patient reports sudden onset of abdominal pain around 12 noon. He was running errands and was at the grocery store at the time. Pain is located in his left lower quadrant and was cramping and sharp. He had 3-4 episodes of small, watery stools. Denies nausea or vomiting. No fevers, chills, chest pain, dysuria, hematuria or any other symptoms. Patient did return from vacation in [place] yesterday where he ate a lot of rich food, but denies raw seafood intake. Possible sick contact as wife had a 12 hour illness with vomiting yesterday. Possible suspect food contact, as patient ate a sandwich last night that had been in the hot car with them as they drove 1000 miles back from [place]. Patient has not had any abdominal surgeries. He has had a kidney stone in the past but did not require instrumentation. He was evaluated at urgent care today and was transferred to the ED due to concern for diverticulitis. Prior to arrival patient received morphine. He reports he is completely pain-free on arrival to the emergency department.   Past Medical History  Diagnosis Date  . Chicken pox   . Kidney stone     nephrolithiasis  . Hyperlipemia 08/19/2012  . Elevated blood pressure 08/19/2012  . Pain in limb 10/20/2013  . Arthritis    Past Surgical History  Procedure Laterality Date  . 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 Arthritis Father   . Cancer Father 60    bladder cancer  . Diabetes Mother    Social History  Substance Use Topics  . Smoking status: Current Some Day Smoker -- 20 years    Types: Cigarettes  . Smokeless tobacco: Never Used     Comment: quit 2012 after smoking on and off a little for years  . Alcohol Use: Yes     Comment: 2 glasses of wine daily     Review of Systems  Constitutional: Negative for fever and chills.  HENT: Negative for congestion and rhinorrhea.   Eyes: Negative for visual disturbance.  Respiratory: Negative for cough and shortness of breath.   Cardiovascular: Negative for chest pain, palpitations and leg swelling.  Gastrointestinal: Positive for abdominal pain and diarrhea. Negative for nausea, vomiting, constipation and blood in stool.  Genitourinary: Negative for dysuria, frequency, hematuria and difficulty urinating.  Musculoskeletal: Negative for back pain and neck pain.  Skin: Negative for pallor and rash.  Neurological: Negative for dizziness and headaches.  Psychiatric/Behavioral: Negative for confusion.      Allergies  Review of patient's allergies indicates no known allergies.  Home Medications   Prior to Admission medications   Medication Sig Start Date End Date Taking? Authorizing Provider  aspirin EC 325 MG EC tablet Take 1 tablet (325 mg total) by mouth 2 (two) times daily after a meal. 05/29/14   Mcarthur Rossetti, MD  docusate sodium 100 MG CAPS Take 100  mg by mouth 2 (two) times daily as needed for mild constipation. 05/29/14   Mcarthur Rossetti, MD  methocarbamol (ROBAXIN) 500 MG tablet Take 1 tablet (500 mg total) by mouth every 6 (six) hours as needed for muscle spasms. 05/29/14   Mcarthur Rossetti, MD  Naftifine HCl (NAFTIN) 2 % CREA Apply to affected areas of both feet twice daily as instructed 06/03/13   Harriet Masson, DPM  oxyCODONE-acetaminophen (ROXICET) 5-325 MG  per tablet Take 1-2 tablets by mouth every 4 (four) hours as needed. 05/29/14   Mcarthur Rossetti, MD  predniSONE (DELTASONE) 5 MG tablet Begin taking 6 tablets daily, taper by one tablet daily until off the medication. 10/20/13   Kathrynn Ducking, MD  selenium 50 MCG TABS tablet Take 50 mcg by mouth daily.    Historical Provider, MD  timolol (BETIMOL) 0.25 % ophthalmic solution Place 1 drop into both eyes 2 (two) times daily.    Historical Provider, MD   BP 193/79 mmHg  Pulse 71  Temp(Src) 97.4 F (36.3 C) (Oral)  Resp 18  SpO2 100% Physical Exam  Constitutional: He is oriented to person, place, and time. He appears well-developed and well-nourished.  HENT:  Head: Normocephalic and atraumatic.  Eyes: EOM are normal. Pupils are equal, round, and reactive to light.  Neck: Normal range of motion. Neck supple.  Cardiovascular: Normal rate, regular rhythm and intact distal pulses.   Pulmonary/Chest: Effort normal and breath sounds normal. No respiratory distress.  Abdominal: Soft. He exhibits no distension and no mass. There is no tenderness. There is no rebound and no guarding.  Musculoskeletal: Normal range of motion. He exhibits no edema or tenderness.  Neurological: He is alert and oriented to person, place, and time.  Skin: Skin is warm and dry. No rash noted.  Psychiatric: He has a normal mood and affect.  Nursing note and vitals reviewed.   ED Course  Procedures (including critical care time) Labs Review Labs Reviewed - No data to display  Imaging Review No results found. I have personally reviewed and evaluated these images and lab results as part of my medical decision-making.   EKG Interpretation None      MDM   Final diagnoses:  Abdominal pain, left lower quadrant    Patient is afebrile, hypertensive with otherwise stable vital signs on arrival. Abdominal exam is completely benign. Patient reports he is feeling normal and back to baseline. Shared decision  making discussion with patient and wife and patient prefers to be discharged home at this time. He will return to the ED for worsening pain, intractable vomiting or bloody stools. I feel this is reasonable as patient is completely asymptomatic and is low risk for acute intra-abdominal pathology. Patient is reliable and will follow up as needed. Patient was discharged in stable condition. Strict return precautions were discussed as above. Patient wife are in agreement with plan.   This patient was seen and discussed with my attending Dr. Rushie Chestnut, MD 11/01/15 0200  Elnora Morrison, MD 11/01/15 1430

## 2015-10-31 NOTE — ED Notes (Signed)
The patient presented to the [facility] with a complaint of left sided lower abdominal pain that started about 3 hours ago. The patient described the pain as burning and pressure. The patient stated that he has had some diarrhea but denied and N/V.

## 2015-10-31 NOTE — ED Provider Notes (Signed)
CSN: [id number]     Arrival date & time 10/31/15  1604 History   First MD Initiated Contact with Patient 10/31/15 1630     Chief Complaint  Patient presents with  . Abdominal Pain   (Consider location/radiation/quality/duration/timing/severity/associated sxs/prior Treatment) HPI History obtained from patient: Asked by nursing staff to see this patient urgently Pt presents with the cc of: Left lower quadrant abdominal pain  Duration of symptoms: 3 hours Treatment prior to arrival: None Context: Sudden onset of abdominal pain this morning. He states that he did eat a sandwich that was left over from yesterday. His had no nausea vomiting or diarrhea. Patient does state that he drove from [place] yesterday which patient states is just a little over 1000 miles. He denies any history of diverticulosis or other abdominal surgery problems in the past. He does states he has had a kidney stone in the past. But this pain feels very different from his renal stone pain. Other symptoms include: Pain score:8 FAMILY HISTORY: Diabetes mother SOCIAL HISTORY: Smoker  Past Medical History  Diagnosis Date  . Chicken pox   . Kidney stone     nephrolithiasis  . Hyperlipemia 08/19/2012  . Elevated blood pressure 08/19/2012  . Pain in limb 10/20/2013  . Arthritis    Past Surgical History  Procedure Laterality Date  . 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 Arthritis Father   . Cancer Father 68    bladder cancer  . Diabetes Mother    Social History  Substance Use Topics  . Smoking status: Current Some Day Smoker -- 20 years    Types: Cigarettes  . Smokeless tobacco: Never Used     Comment: quit 2012 after smoking on and off a little for years  . Alcohol Use: Yes     Comment: 2 glasses of  wine daily     Review of Systems ROS +'ve left lower quadrant abdominal pain  Denies: HEADACHE, NAUSEA,  CHEST PAIN, CONGESTION, DYSURIA, SHORTNESS OF BREATH  Allergies  Review of patient's allergies indicates no known allergies.  Home Medications   Prior to Admission medications   Medication Sig Start Date End Date Taking? Authorizing Provider  aspirin EC 325 MG EC tablet Take 1 tablet (325 mg total) by mouth 2 (two) times daily after a meal. 05/29/14   Mcarthur Rossetti, MD  docusate sodium 100 MG CAPS Take 100 mg by mouth 2 (two) times daily as needed for mild constipation. 05/29/14   Mcarthur Rossetti, MD  methocarbamol (ROBAXIN) 500 MG tablet Take 1 tablet (500 mg total) by mouth every 6 (six) hours as needed for muscle spasms. 05/29/14   Mcarthur Rossetti, MD  Naftifine HCl (NAFTIN) 2 % CREA Apply to affected areas of both feet twice daily as instructed 06/03/13   Harriet Masson, DPM  oxyCODONE-acetaminophen (ROXICET) 5-325 MG per tablet Take 1-2 tablets by mouth every 4 (four) hours as needed. 05/29/14   Mcarthur Rossetti, MD  predniSONE (DELTASONE) 5 MG tablet Begin taking 6 tablets daily, taper by one tablet daily until off the medication. 10/20/13   Kathrynn Ducking, MD  selenium 50 MCG TABS tablet Take 50 mcg by mouth daily.    Historical Provider, MD  timolol (BETIMOL) 0.25 % ophthalmic solution Place 1 drop into  both eyes 2 (two) times daily.    Historical Provider, MD   Meds Ordered and Administered this Visit   Medications  morphine 2 MG/ML injection 4 mg (not administered)  0.9 %  sodium chloride infusion ( Intravenous New Bag/Given 10/31/15 1632)    BP 215/111 mmHg  Pulse 56  Temp(Src) 97.1 F (36.2 C) (Oral)  Resp 20  SpO2 99% No data found.   Physical Exam NURSES NOTES AND VITAL SIGNS REVIEWED. CONSTITUTIONAL: Well developed, well nourished, Well appearing in obvious distress clutching his left lower side. HEENT: normocephalic,  atraumatic EYES: Conjunctiva normal NECK:normal ROM, supple, no adenopathy PULMONARY:No respiratory distress, normal effort ABDOMINAL: Soft, ND,  tender to palpation in the left lower quadrant. There is guarding but no rebound. Rectal exam is deferred. No CVAT MUSCULOSKELETAL: Normal ROM of all extremities,  SKIN: warm and dry without rash PSYCHIATRIC: Mood and affect, behavior are normal  ED Course  Procedures (including critical care time)  Labs Review Labs Reviewed - No data to display  Imaging Review No results found.   Visual Acuity Review  Right Eye Distance:   Left Eye Distance:   Bilateral Distance:    Right Eye Near:   Left Eye Near:    Bilateral Near:      Treatment: Peripheral IV is established with fluids running. Patient will receive 4 mg of IV morphine. He will be transferred to the emergency department via EMS. Patient and family are happy with this plan.   MDM   1. Left lower quadrant pain    Pt requires a higher level of care than can be provided in the Urgent Care setting.   I have suggested to patient and his wife to allow [place] to transfer patient to the emergency department.     Konrad Felix, [place] 10/31/15 1642

## 2015-10-31 NOTE — ED Notes (Signed)
Per EMS: Pt from urgent care, complaining of left abdominal pain. UC questioning diverticulitis. Hx: htn, kidney stones. UC gave pt 4mg  zofran and morphine prior to EMS transfer. BP = 202/100.

## 2015-11-23 MED FILL — TIMOLOL 0.5% EYE DROPS: 0.5 | 25 days supply | Qty: 5 | Fill #1

## 2015-11-30 ENCOUNTER — Ambulatory Visit (INDEPENDENT_AMBULATORY_CARE_PROVIDER_SITE_OTHER): Payer: 59 | Admitting: Family Medicine

## 2015-11-30 ENCOUNTER — Encounter: Payer: Self-pay | Admitting: Family Medicine

## 2015-11-30 VITALS — BP 158/80 | HR 66 | Temp 98.1°F | Ht 69.0 in | Wt 193.0 lb

## 2015-11-30 DIAGNOSIS — R1032 Left lower quadrant pain: Secondary | ICD-10-CM

## 2015-11-30 DIAGNOSIS — R030 Elevated blood-pressure reading, without diagnosis of hypertension: Secondary | ICD-10-CM | POA: Diagnosis not present

## 2015-11-30 DIAGNOSIS — IMO0001 Reserved for inherently not codable concepts without codable children: Secondary | ICD-10-CM

## 2015-11-30 DIAGNOSIS — E785 Hyperlipidemia, unspecified: Secondary | ICD-10-CM | POA: Diagnosis not present

## 2015-11-30 DIAGNOSIS — E119 Type 2 diabetes mellitus without complications: Secondary | ICD-10-CM | POA: Diagnosis not present

## 2015-11-30 DIAGNOSIS — R8290 Unspecified abnormal findings in urine: Secondary | ICD-10-CM | POA: Diagnosis not present

## 2015-11-30 LAB — COMPLETE METABOLIC PANEL WITH GFR
ALT: 27 U/L (ref 9–46)
AST: 20 U/L (ref 10–35)
Albumin: 4.5 g/dL (ref 3.6–5.1)
Alkaline Phosphatase: 41 U/L (ref 40–115)
BUN: 16 mg/dL (ref 7–25)
CALCIUM: 9.5 mg/dL (ref 8.6–10.3)
CO2: 21 mmol/L (ref 20–31)
Chloride: 106 mmol/L (ref 98–110)
Creat: 0.77 mg/dL (ref 0.70–1.25)
GFR, Est Non African American: >89 mL/min (ref >=60)
Glucose, Bld: 101 mg/dL — ABNORMAL HIGH (ref 65–99)
POTASSIUM: 4.4 mmol/L (ref 3.5–5.3)
Sodium: 137 mmol/L (ref 135–146)
Total Bilirubin: 0.7 mg/dL (ref 0.2–1.2)
Total Protein: 7 g/dL (ref 6.1–8.1)

## 2015-11-30 LAB — POCT URINALYSIS DIPSTICK
BILIRUBIN UA: NEGATIVE
GLUCOSE UA: NEGATIVE
KETONES UA: NEGATIVE
LEUKOCYTES UA: NEGATIVE
NITRITE UA: NEGATIVE
Protein, UA: NEGATIVE
RBC UA: NEGATIVE
Spec Grav, UA: >=1.03
Urobilinogen, UA: 0.2
pH, UA: 6

## 2015-11-30 LAB — MICROALBUMIN / CREATININE URINE RATIO
CREATININE, U: 119.6 mg/dL
MICROALB/CREAT RATIO: 1.3 mg/g (ref 0.0–30.0)
Microalb, Ur: 1.5 mg/dL (ref 0.0–1.9)

## 2015-11-30 LAB — CBC WITH DIFFERENTIAL/PLATELET
BASOS ABS: 0.1 10*3/uL (ref 0.0–0.1)
Basophils Relative: 0.9 % (ref 0.0–3.0)
EOS ABS: 0.1 10*3/uL (ref 0.0–0.7)
Eosinophils Relative: 1.6 % (ref 0.0–5.0)
HEMATOCRIT: 48.2 % (ref 39.0–52.0)
HEMOGLOBIN: 16.2 g/dL (ref 13.0–17.0)
Lymphocytes Relative: 28.7 % (ref 12.0–46.0)
Lymphs Abs: 1.9 10*3/uL (ref 0.7–4.0)
MCHC: 33.7 g/dL (ref 30.0–36.0)
MCV: 90.1 fl (ref 78.0–100.0)
Monocytes Absolute: 0.6 10*3/uL (ref 0.1–1.0)
Monocytes Relative: 9 % (ref 3.0–12.0)
Neutro Abs: 3.9 10*3/uL (ref 1.4–7.7)
Neutrophils Relative %: 59.8 % (ref 43.0–77.0)
PLATELETS: 215 10*3/uL (ref 150.0–400.0)
RBC: 5.35 Mil/uL (ref 4.22–5.81)
RDW: 14 % (ref 11.5–15.5)
WBC: 6.5 10*3/uL (ref 4.0–10.5)

## 2015-11-30 LAB — CHOLESTEROL, TOTAL: CHOLESTEROL: 277 mg/dL — AB (ref 0–200)

## 2015-11-30 LAB — HDL CHOLESTEROL: HDL: 42.2 mg/dL (ref >39.00)

## 2015-11-30 LAB — HEMOGLOBIN A1C: HEMOGLOBIN A1C: 6.2 % (ref 4.6–6.5)

## 2015-11-30 NOTE — Patient Instructions (Signed)
Before you leave: -Recheck blood pressure -urine dip with reflux might grow and culture -Schedule a physical in 1 month,  Please in sure that you're by 15 minutes prior to your physical; we will plan to discuss your colon cancer screening further at that visit  Please seek care immediately if you have any further episodes of severe abdominal pain as I would advise imaging at that point.  We recommend the following healthy lifestyle measures: - eat a healthy whole foods diet consisting of regular small meals composed of vegetables, fruits, beans, nuts, seeds, healthy meats such as white chicken and fish and whole grains.  - avoid sweets, white starchy foods, fried foods, fast food, processed foods, sodas, red meet and other fattening foods.  - get a least 150-300 minutes of aerobic exercise per week.

## 2015-11-30 NOTE — Progress Notes (Signed)
Pre visit review using our clinic review tool, if applicable. No additional management support is needed unless otherwise documented below in the visit note. 

## 2015-11-30 NOTE — Progress Notes (Addendum)
HPI:  Adam Perkins is a pleasant 65 year old with a past medical history significant for mild diet treated diabetes, hyperlipidemia and poor compliance, not seen in our office for several years, here for an acute visit today for:  L sided abd pain -once 1 month ago and once 3 days ago -severe, crampy pain in the left lower quadrant with questionable radiation to the left testicle that lasted for a few hours then resovled entirely -No symptoms today and reports he feels great today -Reports he did go to urgent care the first time he had this, the pain was so severe that he was sent to the emergency room, his pain resolved entirely in the emergency room and given pain medication and he declined further evaluation so no imaging or labs were done -History of kidney stones -Reports he may have had mild urinary frequency and hesitancy that occurred only during these events; otherwise denies any dysuria, hematuria, fevers, malaise, diarrhea, constipation, melena, hematochezia -Nausea and emesis 1 during first episode when given IV morphine -He has not been in to see [place] in several years and his out of date on lab work and health maintenance measures; reports today that he tries to avoid the doctor if possible    ROS: See pertinent positives and negatives per HPI.  Past Medical History  Diagnosis Date  . Chicken pox   . Kidney stone     nephrolithiasis  . Hyperlipemia 08/19/2012  . Elevated blood pressure 08/19/2012  . Pain in limb 10/20/2013  . Arthritis     Past Surgical History  Procedure Laterality Date  . 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 Arthritis Father   . Cancer Father 12    bladder cancer  . Diabetes Mother     Social History   Social History  . Marital  Status: Married    Spouse Name: N/A  . Number of Children: N/A  . Years of Education: N/A   Social History Main Topics  . Smoking status: Current Some Day Smoker -- 20 years    Types: Cigarettes  . Smokeless tobacco: Never Used     Comment: quit 2012 after smoking on and off a little for years  . Alcohol Use: Yes     Comment: 2 glasses of wine daily   . Drug Use: No  . Sexual Activity: Not Asked   Other Topics Concern  . None   Social History Narrative     Current outpatient prescriptions:  .  selenium 50 MCG TABS tablet, Take 50 mcg by mouth daily., Disp: , Rfl:  .  timolol (BETIMOL) 0.25 % ophthalmic solution, Place 1 drop into both eyes 2 (two) times daily., Disp: , Rfl:   EXAM:  Filed Vitals:   11/30/15 1118 11/30/15 1205  BP: 152/90 158/80  Pulse: 66   Temp: 98.1 F (36.7 C)     Body mass index is 28.49 kg/(m^2).  GENERAL: vitals reviewed and listed above, alert, oriented, appears well hydrated and in no acute distress  HEENT: atraumatic, conjunttiva clear, no obvious abnormalities on inspection of external nose and ears  NECK: no obvious masses on inspection  LUNGS: clear to auscultation bilaterally, no wheezes, rales or rhonchi, good air movement  CV: HRRR, no peripheral edema  ABD: BS, soft,  NTTP, no rebound or guarding  MS: moves all extremities without noticeable abnormality  PSYCH: pleasant and cooperative, no obvious depression or anxiety  ASSESSMENT AND PLAN:  Discussed the following assessment and plan: More than 50% of > 40 minutes was spent face-to-face with patient, counseling and/or coordinating care   Left lower quadrant pain - Plan: CMP with eGFR, CBC with Differential/Platelets, POC Urinalysis Dipstick -we discussed possible serious and likely etiologies, workup and treatment, treatment risks and return precautions - query stone most likely, then ? Torsion though pain was not sig in testicle so less likely, advised urine, labs and CT,  exam -after this discussion, Adam Perkins agreed to urine and lab studies, declined other - agreed to seek emergency care if recurs -of course, we advised Adam Perkins  to return or notify a doctor immediately if symptoms worsen or persist or new concerns arise. -His out of date on his preventive care and will need colon cancer screening, he declined today but agreed to discuss further follow-up in 1 month  Type 2 diabetes mellitus without complication, without long-term current use of insulin ([place]) - Plan: Hemoglobin A1c, urine micro alb/cr, BMP -He felt that he did not have this diagnosis and wanted it to be removed from his list, we did educate him on the criteria for diagnosis and we'll recheck his labs as he does have diabetes on review of his chart -Regular follow-up will be important, we stressed this today -Physical in 1 month to address health maintenance measures -lifestyle recs in interim  Hyperlipemia - Plan: Cholesterol, Total, HDL cholesterol Elevated blood pressure -Declined treatment today, but agrees to follow up to recheck -lifestyle recs in interim -Close follow-up in 1 month -Labs today  -Patient advised to return or notify a doctor immediately if symptoms worsen or persist or new concerns arise.  Patient Instructions  Before you leave: -Recheck blood pressure -urine dip with reflux might grow and culture -Schedule a physical in 1 month,  Please in sure that you're by 15 minutes prior to your physical; we will plan to discuss your colon cancer screening further at that visit  Please seek care immediately if you have any further episodes of severe abdominal pain as I would advise imaging at that point.  We recommend the following healthy lifestyle measures: - eat a healthy whole foods diet consisting of regular small meals composed of vegetables, fruits, beans, nuts, seeds, healthy meats such as white chicken and fish and whole grains.  - avoid sweets, white starchy foods, fried  foods, fast food, processed foods, sodas, red meet and other fattening foods.  - get a least 150-300 minutes of aerobic exercise per week.          Adam Perkins R.   or

## 2015-12-30 ENCOUNTER — Ambulatory Visit (INDEPENDENT_AMBULATORY_CARE_PROVIDER_SITE_OTHER): Payer: 59 | Admitting: Family Medicine

## 2015-12-30 ENCOUNTER — Encounter: Payer: Self-pay | Admitting: Family Medicine

## 2015-12-30 VITALS — BP 132/80 | HR 68 | Temp 98.1°F | Ht 68.5 in | Wt 196.9 lb

## 2015-12-30 DIAGNOSIS — E785 Hyperlipidemia, unspecified: Secondary | ICD-10-CM | POA: Diagnosis not present

## 2015-12-30 DIAGNOSIS — R739 Hyperglycemia, unspecified: Secondary | ICD-10-CM | POA: Diagnosis not present

## 2015-12-30 DIAGNOSIS — R030 Elevated blood-pressure reading, without diagnosis of hypertension: Secondary | ICD-10-CM

## 2015-12-30 DIAGNOSIS — Z0000 Encounter for general adult medical examination without abnormal findings: Secondary | ICD-10-CM | POA: Diagnosis not present

## 2015-12-30 DIAGNOSIS — IMO0001 Reserved for inherently not codable concepts without codable children: Secondary | ICD-10-CM

## 2015-12-30 DIAGNOSIS — E119 Type 2 diabetes mellitus without complications: Secondary | ICD-10-CM | POA: Insufficient documentation

## 2015-12-30 HISTORY — DX: Hyperglycemia, unspecified: R73.9

## 2015-12-30 LAB — LIPID PANEL
CHOL/HDL RATIO: 6
Cholesterol: 245 mg/dL — ABNORMAL HIGH (ref 0–200)
HDL: 40.6 mg/dL (ref >39.00)
NONHDL: 204.16
Triglycerides: 224 mg/dL — ABNORMAL HIGH (ref 0.0–149.0)
VLDL: 44.8 mg/dL — ABNORMAL HIGH (ref 0.0–40.0)

## 2015-12-30 LAB — LDL CHOLESTEROL, DIRECT: Direct LDL: 185 mg/dL

## 2015-12-30 NOTE — Progress Notes (Signed)
Pre visit review using our clinic review tool, if applicable. No additional management support is needed unless otherwise documented below in the visit note. 

## 2015-12-30 NOTE — Patient Instructions (Addendum)
BEFORE YOU LEAVE: -follow up: labs -follow up in 6 months -colo guard information  Call to let us if you want to do the cologuard test  We recommend the following healthy lifestyle: 1) Small portions - eat off of salad plate instead of dinner plate 2) Eat a healthy clean diet with avoidance of (less then 1 serving per week) processed foods, sweetened drinks, white starches, red meat, fast foods and sweets and consisting of: * 5-9 servings per day of fresh or frozen fruits and vegetables (not corn or potatoes, not dried or canned) *nuts and seeds, beans *olives and olive oil *small portions of lean meats such as fish and white chicken  *small portions of whole grains 3)Get at least 150 minutes of sweaty aerobic exercise per week 4)reduce stress - counseling, meditation, relaxation to balance other aspects of your life   We recommend the following healthy lifestyle: 1) Small portions - eat off of salad plate instead of dinner plate 2) Eat a healthy clean diet with avoidance of (less then 1 serving per week) processed foods, sweetened drinks, white starches, red meat, fast foods and sweets and consisting of: * 5-9 servings per day of fresh or frozen fruits and vegetables (not corn or potatoes, not dried or canned) *nuts and seeds, beans *olives and olive oil *small portions of lean meats such as fish and white chicken  *small portions of whole grains 3)Get at least 150 minutes of sweaty aerobic exercise per week 4)reduce stress - counseling, meditation, relaxation to balance other aspects of your life

## 2015-12-30 NOTE — Progress Notes (Signed)
HPI:  Adam Perkins is a pleasant 65 year old with a past medical history significant for mild diet treated diabetes, hyperlipidemia and poor compliance here for his physical.  -Concerns and/or follow up today:   HLD: -working on lifestyle changes -plan to recheck today and advise medication if not much improved -smokes one cig every other day -2-3 drinks of alcohol per day - red wine QRISK2 score 19.5  Prediabtes: -last hgba1c 6.2  LLQ pain: -declined further evaluation last visit other then labs -reports: completely resolved, no further symptoms  -Diet: variety of foods, balance and well rounded  -Exercise: some, not regular  -Diabetes and Dyslipidemia Screening:FASTING  -Hx of HTN: no  -Vaccines: UTD  -sexual activity: yes, male partner, no new partners  -wants STI testing, Hep C screening (if born 07-1963): no  -FH colon or prstate ca: see FH Last colon cancer screening: never did this - interested in cologuard but wants to check Last prostate ca screening: declined after discussion risks benefits  -Alcohol, Tobacco, drug use: see social history  Review of Systems - no fevers, unintentional weight loss, vision loss, hearing loss, chest pain, sob, hemoptysis, melena, hematochezia, hematuria, genital discharge, changing or concerning skin lesions, bleeding, bruising, loc, thoughts of self harm or SI  Past Medical History  Diagnosis Date  . Chicken pox   . Kidney stone     nephrolithiasis  . Hyperlipemia 08/19/2012  . Elevated blood pressure 08/19/2012  . Pain in limb 10/20/2013  . Arthritis   . Hyperglycemia 12/30/2015  . 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 Avascular necrosis of bone of right hip ([place]) 05/28/2014    Past Surgical History  Procedure Laterality Date  . Total hip arthroplasty Right 05/28/2014    Procedure: RIGHT TOTAL HIP ARTHROPLASTY ANTERIOR APPROACH;  Surgeon: Mcarthur Rossetti, MD;  Location: [facility];  Service:  Orthopedics;  Laterality: Right;    Family History  Problem Relation Age of Onset  . Arthritis Father   . Cancer Father 71    bladder cancer  . Diabetes Mother     Social History   Social History  . Marital Status: Married    Spouse Name: N/A  . Number of Children: N/A  . Years of Education: N/A   Social History Main Topics  . Smoking status: Former Smoker -- 20 years    Types: Cigarettes  . Smokeless tobacco: Never Used     Comment: quit 2012 after smoking on and off a little for years  . Alcohol Use: 0.0 oz/week    0 Standard drinks or equivalent per week     Comment: 2 glasses of wine daily   . Drug Use: No  . Sexual Activity: Not Currently   Other Topics Concern  . None   Social History Narrative     Current outpatient prescriptions:  .  selenium 50 MCG TABS tablet, Take 50 mcg by mouth daily., Disp: , Rfl:  .  timolol (BETIMOL) 0.25 % ophthalmic solution, Place 1 drop into both eyes 2 (two) times daily., Disp: , Rfl:   EXAM:  Filed Vitals:   12/30/15 1122  BP: 132/80  Pulse: 68  Temp: 98.1 F (36.7 C)  TempSrc: Oral  Height: 5' 8.5" (1.74 m)  Weight: 196 lb 14.4 oz (89.313 kg)    Estimated body mass index is 29.5 kg/(m^2) as calculated from the following:   Height as of this encounter: 5' 8.5" (1.74 m).   Weight as of this encounter:  196 lb 14.4 oz (89.313 kg).  GENERAL: vitals reviewed and listed below, alert, oriented, appears well hydrated and in no acute distress  HEENT: head atraumatic, PERRLA, normal appearance of eyes, ears, nose and mouth. moist mucus membranes.  NECK: supple, no masses or lymphadenopathy  LUNGS: clear to auscultation bilaterally, no rales, rhonchi or wheeze  CV: HRRR, no peripheral edema or cyanosis, normal pedal pulses  ABDOMEN: bowel sounds normal, soft, non tender to palpation, no masses, no rebound or guarding  GU: declined  RECTAL: refused  SKIN: no rash or abnormal lesions, declined undressed exam  MS:  normal gait, moves all extremities normally  NEURO: normal gait, speech and thought processing grossly intact, muscle tone grossly intact throughout  PSYCH: normal affect, pleasant and cooperative  ASSESSMENT AND PLAN:  Discussed the following assessment and plan:  Hyperlipemia - Plan: Lipid panel  Visit for preventive health examination  Elevated blood pressure  Hyperglycemia  -Discussed and advised all [place] preventive services health task force level A and B recommendations for age, sex and risks.  --Advised at least 150 minutes of exercise per week and a healthy diet with avoidance of (less then 1 serving per week) processed foods, white starches, red meat, fast foods and sweets and consisting of: * 5-9 servings of fresh fruits and vegetables (not corn or potatoes) *nuts and seeds, beans *olives and olive oil *lean meats such as fish and white chicken  *whole grains  -FASTING labs, studies and vaccines per orders this encounter   Patient advised to return to clinic immediately if symptoms worsen or persist or new concerns.  Patient Instructions  BEFORE YOU LEAVE: -follow up: labs -follow up in 6 months -colo guard information  Call to let us if you want to do the cologuard test  We recommend the following healthy lifestyle: 1) Small portions - eat off of salad plate instead of dinner plate 2) Eat a healthy clean diet with avoidance of (less then 1 serving per week) processed foods, sweetened drinks, white starches, red meat, fast foods and sweets and consisting of: * 5-9 servings per day of fresh or frozen fruits and vegetables (not corn or potatoes, not dried or canned) *nuts and seeds, beans *olives and olive oil *small portions of lean meats such as fish and white chicken  *small portions of whole grains 3)Get at least 150 minutes of sweaty aerobic exercise per week 4)reduce stress - counseling, meditation, relaxation to balance other aspects of your life   We  recommend the following healthy lifestyle: 1) Small portions - eat off of salad plate instead of dinner plate 2) Eat a healthy clean diet with avoidance of (less then 1 serving per week) processed foods, sweetened drinks, white starches, red meat, fast foods and sweets and consisting of: * 5-9 servings per day of fresh or frozen fruits and vegetables (not corn or potatoes, not dried or canned) *nuts and seeds, beans *olives and olive oil *small portions of lean meats such as fish and white chicken  *small portions of whole grains 3)Get at least 150 minutes of sweaty aerobic exercise per week 4)reduce stress - counseling, meditation, relaxation to balance other aspects of your life      No Follow-up on file.   Colin Benton R., DO

## 2016-01-03 MED ORDER — PRAVASTATIN SODIUM 20 MG PO TABS: ORAL_TABLET | ORAL | Status: DC

## 2016-01-03 MED FILL — PRAVASTATIN SODIUM 20 MG TA: 20 | 45 days supply | Qty: 90 | Fill #0

## 2016-01-03 NOTE — Addendum Note (Signed)
Addended by: Agnes Lawrence on: 01/03/2016 05:34 PM   Modules accepted: Orders

## 2016-01-04 ENCOUNTER — Telehealth: Payer: Self-pay | Admitting: Family Medicine

## 2016-01-04 NOTE — Telephone Encounter (Signed)
Pt is returning Adam Perkins call °

## 2016-01-05 NOTE — Telephone Encounter (Signed)
Left message for patient to return phone call. It looks like patient was notified of lab results - and just needs to schedule appt.

## 2016-02-23 MED FILL — TIMOLOL 0.5% EYE DROPS: 0.5 | 25 days supply | Qty: 5 | Fill #2

## 2016-03-09 MED FILL — PRAVASTATIN SODIUM 20 MG TA: 20 | 30 days supply | Qty: 60 | Fill #1

## 2016-03-24 ENCOUNTER — Telehealth: Payer: Self-pay | Admitting: Family Medicine

## 2016-03-24 NOTE — Telephone Encounter (Signed)
No answer at the pts home number. 

## 2016-03-24 NOTE — Telephone Encounter (Signed)
This patient received a letter from [facility] Stating that pravastatin (PRAVACHOL) 20 MG tablet is a temporary medication that was given in a hospital or long-term care facility. If the doctor writes the Rx for fewer days, that the Rx can be refilled.

## 2016-03-24 NOTE — Telephone Encounter (Signed)
I have know idea what his insurance company means by that. But, ok to refill however is needed per his insurance company - should be a cheap med for him regardless. Thanks. Maybe because it was initiation of med with titrating dose? Looks like dose should be 40mg , qd, can do # 90 with 3 refills. Thanks.

## 2016-03-29 NOTE — Telephone Encounter (Signed)
Pt is returning Adam Perkins call °

## 2016-04-20 DIAGNOSIS — H5213 Myopia, bilateral: Secondary | ICD-10-CM | POA: Diagnosis not present

## 2016-04-20 DIAGNOSIS — H52223 Regular astigmatism, bilateral: Secondary | ICD-10-CM | POA: Diagnosis not present

## 2016-05-16 MED FILL — TIMOLOL 0.5% EYE DROPS: 0.5 | 25 days supply | Qty: 5 | Fill #3

## 2016-07-19 MED FILL — TIMOLOL 0.5% EYE DROPS: 0.5 | 25 days supply | Qty: 5 | Fill #4

## 2016-12-19 MED FILL — TIMOLOL 0.5% EYE DROPS: 0.5 | 25 days supply | Qty: 5 | Fill #0

## 2016-12-21 DIAGNOSIS — H40053 Ocular hypertension, bilateral: Secondary | ICD-10-CM | POA: Diagnosis not present

## 2017-03-08 ENCOUNTER — Encounter: Payer: Self-pay | Admitting: Family Medicine

## 2017-03-30 MED FILL — TIMOLOL 0.5% EYE DROPS: 0.5 | 75 days supply | Qty: 15 | Fill #0

## 2017-05-03 MED FILL — BUTALB-CAFF-ACETAMINOPH-COD: 50-325-40-3 | 1 days supply | Qty: 12 | Fill #0

## 2017-06-25 DIAGNOSIS — H40053 Ocular hypertension, bilateral: Secondary | ICD-10-CM | POA: Diagnosis not present

## 2017-06-28 ENCOUNTER — Encounter: Payer: Self-pay | Admitting: Family Medicine

## 2017-07-25 DIAGNOSIS — M2021 Hallux rigidus, right foot: Secondary | ICD-10-CM | POA: Diagnosis not present

## 2017-07-25 DIAGNOSIS — M79671 Pain in right foot: Secondary | ICD-10-CM | POA: Diagnosis not present

## 2018-01-25 DIAGNOSIS — M7060 Trochanteric bursitis, unspecified hip: Secondary | ICD-10-CM | POA: Diagnosis not present

## 2018-01-25 DIAGNOSIS — M25551 Pain in right hip: Secondary | ICD-10-CM | POA: Diagnosis not present

## 2018-01-25 DIAGNOSIS — M25552 Pain in left hip: Secondary | ICD-10-CM | POA: Diagnosis not present

## 2018-03-06 MED FILL — TIMOLOL 0.5% EYE DROPS: 0.5 | 75 days supply | Qty: 15 | Fill #0

## 2018-07-22 DIAGNOSIS — H40053 Ocular hypertension, bilateral: Secondary | ICD-10-CM | POA: Diagnosis not present

## 2018-07-22 MED FILL — TIMOLOL 0.5% EYE DROPS: 0.5 | 90 days supply | Qty: 10 | Fill #0

## 2018-11-29 DIAGNOSIS — L821 Other seborrheic keratosis: Secondary | ICD-10-CM | POA: Diagnosis not present

## 2018-11-29 DIAGNOSIS — C44219 Basal cell carcinoma of skin of left ear and external auricular canal: Secondary | ICD-10-CM | POA: Diagnosis not present

## 2018-12-23 DIAGNOSIS — C44219 Basal cell carcinoma of skin of left ear and external auricular canal: Secondary | ICD-10-CM | POA: Diagnosis not present

## 2018-12-23 MED FILL — levoFLOXacin 500 MG TABS: 500 | 5 days supply | Qty: 5 | Fill #0

## 2019-01-13 DIAGNOSIS — C44219 Basal cell carcinoma of skin of left ear and external auricular canal: Secondary | ICD-10-CM | POA: Diagnosis not present

## 2019-01-13 MED FILL — levoFLOXacin 500 MG TABS: 500 | 5 days supply | Qty: 5 | Fill #0

## 2019-03-10 DIAGNOSIS — H61001 Unspecified perichondritis of right external ear: Secondary | ICD-10-CM | POA: Diagnosis not present

## 2019-03-10 DIAGNOSIS — Z85828 Personal history of other malignant neoplasm of skin: Secondary | ICD-10-CM | POA: Diagnosis not present

## 2019-03-10 DIAGNOSIS — L821 Other seborrheic keratosis: Secondary | ICD-10-CM | POA: Diagnosis not present

## 2019-03-10 DIAGNOSIS — D485 Neoplasm of uncertain behavior of skin: Secondary | ICD-10-CM | POA: Diagnosis not present

## 2019-03-10 DIAGNOSIS — D225 Melanocytic nevi of trunk: Secondary | ICD-10-CM | POA: Diagnosis not present

## 2019-03-10 DIAGNOSIS — B351 Tinea unguium: Secondary | ICD-10-CM | POA: Diagnosis not present

## 2019-03-10 DIAGNOSIS — L570 Actinic keratosis: Secondary | ICD-10-CM | POA: Diagnosis not present

## 2019-03-24 DIAGNOSIS — D485 Neoplasm of uncertain behavior of skin: Secondary | ICD-10-CM | POA: Diagnosis not present

## 2019-03-24 DIAGNOSIS — L988 Other specified disorders of the skin and subcutaneous tissue: Secondary | ICD-10-CM | POA: Diagnosis not present

## 2019-03-26 ENCOUNTER — Other Ambulatory Visit: Payer: Self-pay | Admitting: Orthopaedic Surgery

## 2019-03-26 DIAGNOSIS — M2021 Hallux rigidus, right foot: Secondary | ICD-10-CM | POA: Diagnosis not present

## 2019-03-28 ENCOUNTER — Encounter (HOSPITAL_BASED_OUTPATIENT_CLINIC_OR_DEPARTMENT_OTHER): Payer: Self-pay

## 2019-03-28 ENCOUNTER — Other Ambulatory Visit: Payer: Self-pay

## 2019-03-31 ENCOUNTER — Other Ambulatory Visit (HOSPITAL_COMMUNITY)
Admission: RE | Admit: 2019-03-31 | Discharge: 2019-03-31 | Disposition: A | Discharge status: Home / Self Care | Payer: Medicare Other | Source: Ambulatory Visit | Attending: Orthopaedic Surgery | Admitting: Orthopaedic Surgery

## 2019-03-31 DIAGNOSIS — Z20828 Contact with and (suspected) exposure to other viral communicable diseases: Secondary | ICD-10-CM | POA: Diagnosis not present

## 2019-03-31 DIAGNOSIS — Z01812 Encounter for preprocedural laboratory examination: Secondary | ICD-10-CM | POA: Diagnosis not present

## 2019-03-31 LAB — SARS CORONAVIRUS 2 (TAT 6-24 HRS): SARS Coronavirus 2: NEGATIVE

## 2019-04-03 ENCOUNTER — Ambulatory Visit (HOSPITAL_BASED_OUTPATIENT_CLINIC_OR_DEPARTMENT_OTHER): Payer: Medicare Other | Admitting: Anesthesiology

## 2019-04-03 ENCOUNTER — Encounter (HOSPITAL_BASED_OUTPATIENT_CLINIC_OR_DEPARTMENT_OTHER)
Admission: RE | Disposition: A | Discharge status: Home / Self Care | Payer: Self-pay | Source: Ambulatory Visit | Attending: Orthopaedic Surgery

## 2019-04-03 ENCOUNTER — Encounter: Payer: Self-pay | Admitting: Family Medicine

## 2019-04-03 ENCOUNTER — Other Ambulatory Visit: Payer: Self-pay

## 2019-04-03 ENCOUNTER — Ambulatory Visit (HOSPITAL_BASED_OUTPATIENT_CLINIC_OR_DEPARTMENT_OTHER)
Admission: RE | Admit: 2019-04-03 | Discharge: 2019-04-03 | Disposition: A | Discharge status: Home / Self Care | Payer: Medicare Other | Source: Ambulatory Visit | Attending: Orthopaedic Surgery | Admitting: Orthopaedic Surgery

## 2019-04-03 ENCOUNTER — Encounter (HOSPITAL_BASED_OUTPATIENT_CLINIC_OR_DEPARTMENT_OTHER): Payer: Self-pay

## 2019-04-03 DIAGNOSIS — Z87891 Personal history of nicotine dependence: Secondary | ICD-10-CM | POA: Diagnosis not present

## 2019-04-03 DIAGNOSIS — Z96641 Presence of right artificial hip joint: Secondary | ICD-10-CM | POA: Diagnosis not present

## 2019-04-03 DIAGNOSIS — M25774 Osteophyte, right foot: Secondary | ICD-10-CM | POA: Insufficient documentation

## 2019-04-03 DIAGNOSIS — M2021 Hallux rigidus, right foot: Secondary | ICD-10-CM | POA: Insufficient documentation

## 2019-04-03 DIAGNOSIS — E785 Hyperlipidemia, unspecified: Secondary | ICD-10-CM | POA: Diagnosis not present

## 2019-04-03 DIAGNOSIS — G8918 Other acute postprocedural pain: Secondary | ICD-10-CM | POA: Diagnosis not present

## 2019-04-03 HISTORY — PX: CHEILECTOMY: SHX1336

## 2019-04-03 SURGERY — CHEILECTOMY
Anesthesia: General | Site: Toe | Laterality: Right

## 2019-04-03 MED ORDER — MIDAZOLAM HCL 2 MG/2ML IJ SOLN
1.0000 mg | INTRAMUSCULAR | Status: DC | PRN
  Administered 2019-04-03: 2 mg via INTRAVENOUS

## 2019-04-03 MED ORDER — CELECOXIB 200 MG PO CAPS
ORAL_CAPSULE | ORAL | Status: AC
  Filled 2019-04-03: qty 2

## 2019-04-03 MED ORDER — FENTANYL CITRATE (PF) 100 MCG/2ML IJ SOLN
50.0000 ug | INTRAMUSCULAR | Status: DC | PRN
  Administered 2019-04-03: 100 ug via INTRAVENOUS

## 2019-04-03 MED ORDER — CEFAZOLIN SODIUM-DEXTROSE 2-4 GM/100ML-% IV SOLN
INTRAVENOUS | Status: AC
  Filled 2019-04-03: qty 100

## 2019-04-03 MED ORDER — LIDOCAINE 2% (20 MG/ML) 5 ML SYRINGE
INTRAMUSCULAR | Status: AC
  Filled 2019-04-03: qty 5

## 2019-04-03 MED ORDER — ONDANSETRON HCL 4 MG/2ML IJ SOLN
INTRAMUSCULAR | Status: DC | PRN
  Administered 2019-04-03: 4 mg via INTRAVENOUS

## 2019-04-03 MED ORDER — FENTANYL CITRATE (PF) 100 MCG/2ML IJ SOLN
INTRAMUSCULAR | Status: AC
  Filled 2019-04-03: qty 2

## 2019-04-03 MED ORDER — ACETAMINOPHEN 500 MG PO TABS
ORAL_TABLET | ORAL | Status: AC
  Filled 2019-04-03: qty 2

## 2019-04-03 MED ORDER — BUPIVACAINE LIPOSOME 1.3 % IJ SUSP
INTRAMUSCULAR | Status: DC | PRN
  Administered 2019-04-03: 10 mL via PERINEURAL

## 2019-04-03 MED ORDER — ACETAMINOPHEN 500 MG PO TABS
1000.0000 mg | ORAL_TABLET | Freq: Once | ORAL | Status: AC
  Administered 2019-04-03: 1000 mg via ORAL

## 2019-04-03 MED ORDER — LIDOCAINE 2% (20 MG/ML) 5 ML SYRINGE
INTRAMUSCULAR | Status: DC | PRN
  Administered 2019-04-03: 40 mg via INTRAVENOUS

## 2019-04-03 MED ORDER — PROMETHAZINE HCL 25 MG/ML IJ SOLN: 6.2500 mg | INTRAMUSCULAR | Status: DC | PRN

## 2019-04-03 MED ORDER — LACTATED RINGERS IV SOLN
INTRAVENOUS | Status: DC
  Administered 2019-04-03: 09:00:00 via INTRAVENOUS

## 2019-04-03 MED ORDER — OXYCODONE HCL 5 MG PO TABS: 5.0000 mg | ORAL_TABLET | ORAL | 0 refills | Status: AC | PRN

## 2019-04-03 MED ORDER — DEXAMETHASONE SODIUM PHOSPHATE 4 MG/ML IJ SOLN
INTRAMUSCULAR | Status: DC | PRN
  Administered 2019-04-03: 5 mg via INTRAVENOUS

## 2019-04-03 MED ORDER — ONDANSETRON HCL 4 MG/2ML IJ SOLN
INTRAMUSCULAR | Status: AC
  Filled 2019-04-03: qty 2

## 2019-04-03 MED ORDER — POVIDONE-IODINE 10 % EX SWAB
2.0000 "application " | Freq: Once | CUTANEOUS | Status: AC
  Administered 2019-04-03: 2 via TOPICAL

## 2019-04-03 MED ORDER — CEFAZOLIN SODIUM-DEXTROSE 2-4 GM/100ML-% IV SOLN
2.0000 g | INTRAVENOUS | Status: AC
  Administered 2019-04-03: 2 g via INTRAVENOUS

## 2019-04-03 MED ORDER — BUPIVACAINE HCL (PF) 0.25 % IJ SOLN
INTRAMUSCULAR | Status: DC | PRN
  Administered 2019-04-03: 15 mL

## 2019-04-03 MED ORDER — MIDAZOLAM HCL 2 MG/2ML IJ SOLN
INTRAMUSCULAR | Status: AC
  Filled 2019-04-03: qty 2

## 2019-04-03 MED ORDER — DEXAMETHASONE SODIUM PHOSPHATE 10 MG/ML IJ SOLN
INTRAMUSCULAR | Status: AC
  Filled 2019-04-03: qty 1

## 2019-04-03 MED ORDER — CELECOXIB 400 MG PO CAPS
400.0000 mg | ORAL_CAPSULE | Freq: Once | ORAL | Status: AC
  Administered 2019-04-03: 09:00:00 400 mg via ORAL

## 2019-04-03 MED ORDER — FENTANYL CITRATE (PF) 100 MCG/2ML IJ SOLN: 25.0000 ug | INTRAMUSCULAR | Status: DC | PRN

## 2019-04-03 MED FILL — oxyCODONE HCL 5 MG TABS: 5 | 6 days supply | Qty: 40 | Fill #0

## 2019-04-03 SURGICAL SUPPLY — 59 items
BANDAGE ESMARK 6X9 LF (GAUZE/BANDAGES/DRESSINGS) IMPLANT
BENZOIN TINCTURE PRP APPL 2/3 (GAUZE/BANDAGES/DRESSINGS) IMPLANT
BLADE AVERAGE 25MMX9MM (BLADE) ×1
BLADE AVERAGE 25X9 (BLADE) ×2 IMPLANT
BLADE SURG 15 STRL LF DISP TIS (BLADE) ×5 IMPLANT
BLADE SURG 15 STRL SS (BLADE) ×10
BNDG CONFORM 2 STRL LF (GAUZE/BANDAGES/DRESSINGS) ×3 IMPLANT
BNDG ELASTIC 4X5.8 VLCR STR LF (GAUZE/BANDAGES/DRESSINGS) ×3 IMPLANT
BNDG ELASTIC 6X5.8 VLCR STR LF (GAUZE/BANDAGES/DRESSINGS) IMPLANT
BNDG ESMARK 4X9 LF (GAUZE/BANDAGES/DRESSINGS) ×3 IMPLANT
BNDG ESMARK 6X9 LF (GAUZE/BANDAGES/DRESSINGS)
CHLORAPREP W/TINT 26 (MISCELLANEOUS) ×6 IMPLANT
CLOSURE WOUND 1/2 X4 (GAUZE/BANDAGES/DRESSINGS)
COVER BACK TABLE REUSABLE LG (DRAPES) ×3 IMPLANT
COVER WAND RF STERILE (DRAPES) IMPLANT
CUFF TOURN SGL QUICK 34 (TOURNIQUET CUFF)
CUFF TRNQT CYL 34X4.125X (TOURNIQUET CUFF) IMPLANT
DECANTER SPIKE VIAL GLASS SM (MISCELLANEOUS) IMPLANT
DRAPE EXTREMITY T 121X128X90 (DISPOSABLE) ×3 IMPLANT
DRAPE IMP U-DRAPE 54X76 (DRAPES) ×3 IMPLANT
DRAPE OEC MINIVIEW 54X84 (DRAPES) ×3 IMPLANT
DRAPE U-SHAPE 47X51 STRL (DRAPES) ×3 IMPLANT
ELECT REM PT RETURN 9FT ADLT (ELECTROSURGICAL) ×3
ELECTRODE REM PT RTRN 9FT ADLT (ELECTROSURGICAL) ×1 IMPLANT
GAUZE SPONGE 4X4 12PLY STRL (GAUZE/BANDAGES/DRESSINGS) ×3 IMPLANT
GAUZE XEROFORM 1X8 LF (GAUZE/BANDAGES/DRESSINGS) ×3 IMPLANT
GLOVE BIOGEL M STRL SZ7.5 (GLOVE) ×3 IMPLANT
GLOVE BIOGEL PI IND STRL 8 (GLOVE) ×1 IMPLANT
GLOVE BIOGEL PI INDICATOR 8 (GLOVE) ×2
GOWN STRL REUS W/ TWL LRG LVL3 (GOWN DISPOSABLE) ×1 IMPLANT
GOWN STRL REUS W/ TWL XL LVL3 (GOWN DISPOSABLE) ×1 IMPLANT
GOWN STRL REUS W/TWL LRG LVL3 (GOWN DISPOSABLE) ×2
GOWN STRL REUS W/TWL XL LVL3 (GOWN DISPOSABLE) ×2
NEEDLE HYPO 25X1 1.5 SAFETY (NEEDLE) IMPLANT
NS IRRIG 1000ML POUR BTL (IV SOLUTION) ×3 IMPLANT
PACK BASIN DAY SURGERY FS (CUSTOM PROCEDURE TRAY) ×3 IMPLANT
PAD CAST 4YDX4 CTTN HI CHSV (CAST SUPPLIES) ×1 IMPLANT
PADDING CAST COTTON 4X4 STRL (CAST SUPPLIES) ×2
PADDING CAST SYNTHETIC 4 (CAST SUPPLIES)
PADDING CAST SYNTHETIC 4X4 STR (CAST SUPPLIES) IMPLANT
PENCIL BUTTON HOLSTER BLD 10FT (ELECTRODE) ×3 IMPLANT
SLEEVE SCD COMPRESS KNEE MED (MISCELLANEOUS) ×3 IMPLANT
SPONGE LAP 18X18 RF (DISPOSABLE) IMPLANT
STOCKINETTE 6  STRL (DRAPES) ×2
STOCKINETTE 6 STRL (DRAPES) ×1 IMPLANT
STRIP CLOSURE SKIN 1/2X4 (GAUZE/BANDAGES/DRESSINGS) IMPLANT
SUCTION FRAZIER HANDLE 10FR (MISCELLANEOUS) ×2
SUCTION TUBE FRAZIER 10FR DISP (MISCELLANEOUS) ×1 IMPLANT
SUT BONE WAX W31G (SUTURE) ×3 IMPLANT
SUT ETHILON 3 0 PS 1 (SUTURE) ×3 IMPLANT
SUT MNCRL AB 3-0 PS2 18 (SUTURE) ×3 IMPLANT
SUT PDS AB 2-0 CT2 27 (SUTURE) ×3 IMPLANT
SUT VIC AB 3-0 FS2 27 (SUTURE) IMPLANT
SYR BULB 3OZ (MISCELLANEOUS) ×3 IMPLANT
SYR CONTROL 10ML LL (SYRINGE) IMPLANT
TOWEL GREEN STERILE FF (TOWEL DISPOSABLE) ×6 IMPLANT
TUBE CONNECTING 20'X1/4 (TUBING) ×1
TUBE CONNECTING 20X1/4 (TUBING) ×2 IMPLANT
UNDERPAD 30X36 HEAVY ABSORB (UNDERPADS AND DIAPERS) ×3 IMPLANT

## 2019-04-03 NOTE — Anesthesia Preprocedure Evaluation (Addendum)
Anesthesia Evaluation  Patient identified by MRN, date of birth, ID band Patient awake    Reviewed: Allergy & Precautions, NPO status , Patient's Chart, lab work & pertinent test results  History of Anesthesia Complications Negative for: history of anesthetic complications  Airway Mallampati: II  TM Distance: >3 FB Neck ROM: Full    Dental no notable dental hx. (+) Dental Advisory Given   Pulmonary former smoker,    Pulmonary exam normal        Cardiovascular negative cardio ROS Normal cardiovascular exam     Neuro/Psych negative neurological ROS  negative psych ROS   GI/Hepatic negative GI ROS, Neg liver ROS,   Endo/Other  negative endocrine ROS  Renal/GU negative Renal ROS     Musculoskeletal negative musculoskeletal ROS (+)   Abdominal   Peds  Hematology negative hematology ROS (+)   Anesthesia Other Findings Day of surgery medications reviewed with the patient.  Reproductive/Obstetrics                            Anesthesia Physical Anesthesia Plan  ASA: I  Anesthesia Plan:    Post-op Pain Management:  Regional for Post-op pain   Induction: Intravenous  PONV Risk Score and Plan: 2  Airway Management Planned: LMA  Additional Equipment:   Intra-op Plan:   Post-operative Plan: Extubation in OR  Informed Consent: I have reviewed the patients History and Physical, chart, labs and discussed the procedure including the risks, benefits and alternatives for the proposed anesthesia with the patient or authorized representative who has indicated his/her understanding and acceptance.     Dental advisory given  Plan Discussed with: CRNA, Anesthesiologist and Surgeon  Anesthesia Plan Comments:        Anesthesia Quick Evaluation

## 2019-04-03 NOTE — Op Note (Signed)
Adam Perkins male 68 y.o. 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 male had pain in his first MTP joint x-ray evidence of a large dorsal osteophyte.  He had difficulty with shoe wear and pain with ambulation.  He tried shoe modifications, activity modifications and anti-inflammatories without relief.  He was indicated for dorsal cheilectomy.  We did discuss the possibility of increased pain due to increased joint motion after surgery and he agreed to proceed despite this risk.  We also discussed the risk, benefits and alternatives of surgery which include but not limited to wound healing complications, infection, and need for further surgery or damage to surrounding structures.  After weighing these risks he opted to proceed with surgery.  He understood that if he has increased pain the only other surgical option would be for first MTP arthrodesis.  PROCEDURE: Patient was identified in the preoperative holding area.  Consent was signed by myself and the patient.  Site was marked by myself.  Peripheral nerve block was performed by anesthesia.  He was taken to the operative suite placed upon the operative table.  The anesthesia was induced without difficulty.  Preoperative antibiotics were given.  The right lower extremity was prepped and draped in the usual sterile fashion.  Bone was placed under the right hip and bone foam was used.  Surgical timeout was performed.  We began the neck and a longitudinal incision overlying the first MTP joint just medial to the extensor  hallucis longus tendon.  There is a large bony prominence there.  We went sharply down through skin and subcutaneous tissue mobilized medial and lateral soft tissue flaps.  Care was taken to identify the dorsomedial cutaneous branch which was not within the surgical field.  Then the extensor houses longus tendon was identified and the soft tissue was incised down to bone just medial to this tendon and tendon sheath.  Soft tissue was mobilized about the joint.  There is a large mobile osteophytes and bony fragments near the dorsal aspect of the first MTP joint.  These were cleaned out with a rondure.  Then using a sagittal saw was able to identify the normal bone morphology and cleaned this off.  Then an oblique cut was made about the dorsal first metatarsal head that freed up any bony impingement.  Then the proximal phalanx was cleaned of osteophytes on the medial, dorsal and lateral side.  This was done with a rondure.  The joint surfaces were inspected.  There was full-thickness cartilage loss and subchondral bone exposed nearly the entire first metatarsal head and proximal phalanx base.  After removal of the bone spurs the joint was taken through range of motion there was no obvious bony impingement.  Fluoroscopy confirmed adequate bone resection and motion about the joint.  Then the joint was irrigated copiously with normal saline and redundant capsular tissue was excised with a 15 blade.  Then the capsular tissue was repaired using a 2-0 PDS suture.  The tourniquet was released and hemostasis was obtained.  Then the deep tissue was closed with 3-0 Monocryl and the skin with 3-0 nylon.  He was then placed in a  soft dressing including Xeroform, 4 x 4's, sterile sheet cotton and 4 inch Ace wrap.  He will be placed in a postoperative shoe in the recovery room.  He was awakened from anesthesia and taken recovery in stable condition.  He tolerated it well.  There were no complications.  All counts were correct at  the end the case.  POST OPERATIVE INSTRUCTIONS: Flatfoot weightbearing in a postoperative shoe Keep dressing in place Call the office with concerns Follow-up in 2 weeks for wound check and suture removal if appropriate.  At that time he would likely be appropriate for transition into regular shoe as tolerated.  BLOOD LOSS:  Minimal         DRAINS: none         SPECIMEN: none       COMPLICATIONS:  * No complications entered in OR log *         Disposition: PACU - hemodynamically stable.         Condition: stable

## 2019-04-03 NOTE — Discharge Instructions (Signed)
DR. [facility] G[facility] ANKLE SURGERY POST-OP INSTRUCTIONS   Pain Management 1. The numbing medicine and your leg will last around 18 hours, take a dose of your pain medicine as soon as you feel it wearing off to avoid rebound pain. 2. Keep your foot elevated above heart level.  Make sure that your heel hangs free ('floats'). 3. Take all prescribed medication as directed. 4. If taking narcotic pain medication you may want to use an over-the-counter stool softener to avoid constipation. 5. You may take over-the-counter NSAIDs (ibuprofen, naproxen, etc.) as well as over-the-counter acetaminophen as directed on the packaging as a supplement for your pain and may also use it to wean away from the prescription medication.  Activity ? Heel WB in post operative shoe. ? Keep dressing in place  First Postoperative Visit 1. Your first postop visit will be at least 2 weeks after surgery.  This should be scheduled when you schedule surgery. 2. If you do not have a postoperative visit scheduled please call [phone] to schedule an appointment. 3. At the appointment your incision will be evaluated for suture removal, x-rays will be obtained if necessary.  General Instructions 1. Swelling is very common after foot and ankle surgery.  It often takes 3 months for the foot and ankle to begin to feel comfortable.  Some amount of swelling will persist for 6-12 months. 2. DO NOT change the dressing.  If there is a problem with the dressing (too tight, loose, gets wet, etc.) please contact Dr. Pollie Friar office. 3. DO NOT get the dressing wet.  For showers you can use an over-the-counter cast cover or wrap a washcloth around the top of your dressing and then cover it with a plastic bag and tape it to your leg. 4. DO NOT soak the incision (no tubs, pools, bath, etc.) until you have approval from Dr. Lucia Gaskins.  Contact Dr. Huel Cote office or go to Emergency Room if: 1. Temperature above 101 F. 2. Increasing pain that is  unresponsive to pain medication or elevation 3. Excessive redness or swelling in your foot 4. Dressing problems - excessive bloody drainage, looseness or tightness, or if dressing gets wet 5. Develop pain, swelling, warmth, or discoloration of your calf   Post Anesthesia Home Care Instructions  Activity: Get plenty of rest for the remainder of the day. A responsible individual must stay with you for 24 hours following the procedure.  For the next 24 hours, DO NOT: -Drive a car -Paediatric nurse -Drink alcoholic beverages -Take any medication unless instructed by your physician -Make any legal decisions or sign important papers.  Meals: Start with liquid foods such as gelatin or soup. Progress to regular foods as tolerated. Avoid greasy, spicy, heavy foods. If nausea and/or vomiting occur, drink only clear liquids until the nausea and/or vomiting subsides. Call your physician if vomiting continues.  Special Instructions/Symptoms: Your throat may feel dry or sore from the anesthesia or the breathing tube placed in your throat during surgery. If this causes discomfort, gargle with warm salt water. The discomfort should disappear within 24 hours.  If you had a scopolamine patch placed behind your ear for the management of post- operative nausea and/or vomiting:  1. The medication in the patch is effective for 72 hours, after which it should be removed.  Wrap patch in a tissue and discard in the trash. Wash hands thoroughly with soap and water. 2. You may remove the patch earlier than 72 hours if you experience unpleasant side effects  which may include dry mouth, dizziness or visual disturbances. 3. Avoid touching the patch. Wash your hands with soap and water after contact with the patch.    Regional Anesthesia Blocks  1. Numbness or the inability to move the "blocked" extremity may last from 3-48 hours after placement. The length of time depends on the medication injected and your  individual response to the medication. If the numbness is not going away after 48 hours, call your surgeon.  2. The extremity that is blocked will need to be protected until the numbness is gone and the  Strength has returned. Because you cannot feel it, you will need to take extra care to avoid injury. Because it may be weak, you may have difficulty moving it or using it. You may not know what position it is in without looking at it while the block is in effect.  3. For blocks in the legs and feet, returning to weight bearing and walking needs to be done carefully. You will need to wait until the numbness is entirely gone and the strength has returned. You should be able to move your leg and foot normally before you try and bear weight or walk. You will need someone to be with you when you first try to ensure you do not fall and possibly risk injury.  4. Bruising and tenderness at the needle site are common side effects and will resolve in a few days.  5. Persistent numbness or new problems with movement should be communicated to the surgeon or the [facility] [phone] [place] [phone]).Information for Discharge Teaching: EXPAREL (bupivacaine liposome injectable suspension)   Your surgeon or anesthesiologist gave you EXPAREL(bupivacaine) to help control your pain after surgery.   EXPAREL is a local anesthetic that provides pain relief by numbing the tissue around the surgical site.  EXPAREL is designed to release pain medication over time and can control pain for up to 72 hours.  Depending on how you respond to EXPAREL, you may require less pain medication during your recovery.  Possible side effects:  Temporary loss of sensation or ability to move in the area where bupivacaine was injected.  Nausea, vomiting, constipation  Rarely, numbness and tingling in your mouth or lips, lightheadedness, or anxiety may occur.  Call your doctor right away if  you think you may be experiencing any of these sensations, or if you have other questions regarding possible side effects.  Follow all other discharge instructions given to you by your surgeon or nurse. Eat a healthy diet and drink plenty of water or other fluids.  If you return to the hospital for any reason within 96 hours following the administration of EXPAREL, it is important for health care providers to know that you have received this anesthetic. A teal colored band has been placed on your arm with the date, time and amount of EXPAREL you have received in order to alert and inform your health care providers. Please leave this armband in place for the full 96 hours following administration, and then you may remove the band.

## 2019-04-03 NOTE — H&P (Signed)
Adam Perkins is an 68 y.o. male.   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  He has been dealing with pain for years.  The prominence has grown significantly over the past couple of years.  He has stiffness and discomfort.  He is here today for surgery.  He continues to have pain in the right foot that is recalcitrant to conservative treatment in the form of activity modifications, anti-inflammatories, shoe modifications.  He denies any recent fevers or chills.  Past Medical History:  Diagnosis Date  . Arthritis   . Avascular necrosis of bone of right hip ([place]) 05/28/2014  . Chicken pox   . Elevated blood pressure 08/19/2012  . Hyperglycemia 12/30/2015  . Hyperlipemia 08/19/2012  . Kidney stone    nephrolithiasis  . Pain in limb 10/20/2013  . 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 TOTAL HIP ARTHROPLASTY Right 05/28/2014   Procedure: RIGHT TOTAL HIP ARTHROPLASTY ANTERIOR APPROACH;  Surgeon: Mcarthur Rossetti, MD;  Location: [facility];  Service: Orthopedics;  Laterality: Right;    Family History  Problem Relation Age of Onset  . Arthritis Father   . Cancer Father 55       bladder cancer  . Diabetes Mother    Social History:  reports that he has quit smoking. His smoking use included cigarettes. He quit after 20.00 years of use. He has never used smokeless tobacco. He reports current alcohol use. He reports that he does not use drugs.  Allergies: No Known Allergies  Medications Prior to Admission  Medication Sig Dispense Refill  . selenium 50 MCG TABS tablet Take 50 mcg by mouth daily.    . timolol (BETIMOL) 0.25 % ophthalmic solution Place 1 drop into both eyes 2 (two) times daily.      No results found for this or any previous visit (from the past 48 hour(s)). No results found.  Review of Systems   Constitutional: Negative.   HENT: Negative.   Eyes: Negative.   Cardiovascular: Negative.   Gastrointestinal: Negative.   Musculoskeletal:       Right foot pain  Skin: Negative.   Neurological: Negative.   Psychiatric/Behavioral: Negative.     Blood pressure (!) 176/73, pulse 64, temperature (!) 97.5 F (36.4 C), temperature source Oral, resp. rate 14, height 5\' 9"  (1.753 m), weight 87.2 kg, SpO2 99 %. Physical Exam  Vitals reviewed. Constitutional: He appears well-developed.  HENT:  Head: Normocephalic.  Eyes: Conjunctivae are normal.  Neck: Neck supple.  Cardiovascular: Normal rate.  Respiratory: Effort normal.  Musculoskeletal:     Comments: There is prominence to the right first MTP joint.  He has tenderness to palpation there.  Has limited motion of the MTP joint and pain with manipulation.  No tenderness palpation about the IP joint and no pain with manipulation of the IP joint.  Patient endorses sensation to light touch dorsally and plantarly about the hallux.  No other areas of tenderness to palpation.  Foot is warm and well-perfused.  Neurological: He is alert.  Skin: Skin is warm.  Psychiatric: He has a normal mood and affect.     Assessment/Plan We will proceed with right first MTP dorsal cheilectomy.  He understands that removal of the bone spur may incite more pain due to the underlying arthritis.  He will be placed in a  soft dressing and be made weightbearing as tolerated postoperatively.  We discussed the risk benefits alternatives which include but are not limited to wound healing complications, infection, need for further surgery as well as demonstrating structures.  He also understands the possibility of increased pain due to increased motion at the joint.  He would like to proceed with surgery.  Erle Crocker, MD 04/03/2019, 10:23 AM

## 2019-04-03 NOTE — Anesthesia Procedure Notes (Signed)
Procedure Name: LMA Insertion Performed by: Jazlynne Milliner W, CRNA Pre-anesthesia Checklist: Patient identified, Emergency Drugs available, Suction available and Patient being monitored Patient Re-evaluated:Patient Re-evaluated prior to induction Oxygen Delivery Method: Circle system utilized Preoxygenation: Pre-oxygenation with 100% oxygen Induction Type: IV induction Ventilation: Mask ventilation without difficulty LMA: LMA inserted LMA Size: 5.0 Number of attempts: 1 Placement Confirmation: positive ETCO2 Tube secured with: Tape Dental Injury: Teeth and Oropharynx as per pre-operative assessment        

## 2019-04-03 NOTE — Progress Notes (Signed)
Assisted Dr. Singer with right, ultrasound guided, popliteal/saphenous block. Side rails up, monitors on throughout procedure. See vital signs in flow sheet. Tolerated Procedure well. 

## 2019-04-03 NOTE — Anesthesia Postprocedure Evaluation (Signed)
Anesthesia Post Note  Patient: Adam Perkins  Procedure(s) Performed: RIGHT DORSAL CHEILECTOMY (Right Toe)     Patient location during evaluation: PACU Anesthesia Type: General Level of consciousness: sedated Pain management: pain level controlled Vital Signs Assessment: post-procedure vital signs reviewed and stable Respiratory status: spontaneous breathing and respiratory function stable Cardiovascular status: stable Postop Assessment: no apparent nausea or vomiting Anesthetic complications: no    Last Vitals:  Vitals:   04/03/19 1230 04/03/19 1241  BP: (!) 156/73   Pulse: (!) 56 (!) 55  Resp: 14 (!) 23  Temp:    SpO2: 98% 99%    Last Pain:  Vitals:   04/03/19 1250  TempSrc:   PainSc: 0-No pain                 Cairo Agostinelli DANIEL

## 2019-04-03 NOTE — Anesthesia Procedure Notes (Signed)
Anesthesia Regional Block: Popliteal block   Pre-Anesthetic Checklist: ,, timeout performed, Correct Patient, Correct Site, Correct Laterality, Correct Procedure, Correct Position, site marked, Risks and benefits discussed,  Surgical consent,  Pre-op evaluation,  At surgeon's request and post-op pain management  Laterality: Right  Prep: chloraprep       Needles:  Injection technique: Single-shot  Needle Type: Echogenic Stimulator Needle          Additional Needles:   Narrative:  Start time: 04/03/2019 10:08 AM End time: 04/03/2019 10:18 AM Injection made incrementally with aspirations every 5 mL.  Performed by: Personally  Anesthesiologist: Duane Boston, MD  Additional Notes: A functioning IV was confirmed and monitors were applied.  Sterile prep and drape, hand hygiene and sterile gloves were used.  Negative aspiration and test dose prior to incremental administration of local anesthetic. The patient tolerated the procedure well.Ultrasound  guidance: relevant anatomy identified, needle position confirmed, local anesthetic spread visualized around nerve(s), vascular puncture avoided.  Image printed for medical record.

## 2019-04-03 NOTE — Transfer of Care (Signed)
Immediate Anesthesia Transfer of Care Note  Patient: Adam Perkins  Procedure(s) Performed: RIGHT DORSAL CHEILECTOMY (Right Toe)  Patient Location: PACU  Anesthesia Type:General and Regional  Level of Consciousness: awake, alert  and oriented  Airway & Oxygen Therapy: Patient Spontanous Breathing and Patient connected to nasal cannula oxygen  Post-op Assessment: Report given to RN and Post -op Vital signs reviewed and stable  Post vital signs: Reviewed and stable  Last Vitals:  Vitals Value Taken Time  BP 126/69 04/03/19 1208  Temp    Pulse 61 04/03/19 1211  Resp 24 04/03/19 1211  SpO2 100 % 04/03/19 1211  Vitals shown include unvalidated device data.  Last Pain:  Vitals:   04/03/19 0921  TempSrc: Oral  PainSc: 0-No pain      Patients Stated Pain Goal: 5 ([id number])  Complications: No apparent anesthesia complications

## 2019-04-04 ENCOUNTER — Encounter (HOSPITAL_BASED_OUTPATIENT_CLINIC_OR_DEPARTMENT_OTHER): Payer: Self-pay | Admitting: Orthopaedic Surgery

## 2019-06-11 DIAGNOSIS — K30 Functional dyspepsia: Secondary | ICD-10-CM | POA: Diagnosis not present

## 2019-06-11 DIAGNOSIS — R197 Diarrhea, unspecified: Secondary | ICD-10-CM | POA: Diagnosis not present

## 2019-06-11 DIAGNOSIS — R112 Nausea with vomiting, unspecified: Secondary | ICD-10-CM | POA: Diagnosis not present

## 2019-06-11 DIAGNOSIS — R778 Other specified abnormalities of plasma proteins: Secondary | ICD-10-CM | POA: Diagnosis not present

## 2019-06-11 DIAGNOSIS — R0789 Other chest pain: Secondary | ICD-10-CM | POA: Diagnosis not present

## 2019-06-11 DIAGNOSIS — R079 Chest pain, unspecified: Secondary | ICD-10-CM | POA: Diagnosis not present

## 2019-06-11 DIAGNOSIS — Z743 Need for continuous supervision: Secondary | ICD-10-CM | POA: Diagnosis not present

## 2019-06-16 ENCOUNTER — Telehealth: Payer: Self-pay

## 2019-06-16 NOTE — Telephone Encounter (Signed)
Called pt 3x no answer. Dr.Kim is not accepting any new pt at this time. Pt will need to est. Care with another provider.

## 2019-06-16 NOTE — Telephone Encounter (Signed)
Copied from [place] [id number]. Topic: General - Other >> Jun 16, 2019  9:20 AM Keene Breath wrote: Reason for CRM: Patients's wife called to schedule an appt. For patient who was a patient of Dr. Maudie Mercury.  Tried the office 2x but no answer.  Please call to schedule asap.  Wife Arbaaz Declercq, CB# [phone])

## 2019-06-17 ENCOUNTER — Ambulatory Visit (INDEPENDENT_AMBULATORY_CARE_PROVIDER_SITE_OTHER): Payer: Medicare Other | Admitting: Family Medicine

## 2019-06-17 ENCOUNTER — Encounter: Payer: Self-pay | Admitting: Family Medicine

## 2019-06-17 ENCOUNTER — Other Ambulatory Visit: Payer: Self-pay

## 2019-06-17 ENCOUNTER — Encounter: Payer: Self-pay | Admitting: Gastroenterology

## 2019-06-17 VITALS — BP 150/90 | HR 71 | Temp 97.7°F | Ht 69.0 in | Wt 196.6 lb

## 2019-06-17 DIAGNOSIS — Z23 Encounter for immunization: Secondary | ICD-10-CM | POA: Diagnosis not present

## 2019-06-17 DIAGNOSIS — R739 Hyperglycemia, unspecified: Secondary | ICD-10-CM

## 2019-06-17 DIAGNOSIS — R778 Other specified abnormalities of plasma proteins: Secondary | ICD-10-CM

## 2019-06-17 DIAGNOSIS — F17200 Nicotine dependence, unspecified, uncomplicated: Secondary | ICD-10-CM

## 2019-06-17 DIAGNOSIS — R030 Elevated blood-pressure reading, without diagnosis of hypertension: Secondary | ICD-10-CM

## 2019-06-17 DIAGNOSIS — E782 Mixed hyperlipidemia: Secondary | ICD-10-CM

## 2019-06-17 DIAGNOSIS — Z0000 Encounter for general adult medical examination without abnormal findings: Secondary | ICD-10-CM

## 2019-06-17 DIAGNOSIS — Z1211 Encounter for screening for malignant neoplasm of colon: Secondary | ICD-10-CM

## 2019-06-17 LAB — BASIC METABOLIC PANEL
BUN: 14 mg/dL (ref 6–23)
CO2: 26 mEq/L (ref 19–32)
Calcium: 9.5 mg/dL (ref 8.4–10.5)
Chloride: 101 mEq/L (ref 96–112)
Creatinine, Ser: 0.74 mg/dL (ref 0.40–1.50)
GFR: 105.07 mL/min (ref >60.00)
Glucose, Bld: 143 mg/dL — ABNORMAL HIGH (ref 70–99)
Potassium: 4.3 mEq/L (ref 3.5–5.1)
Sodium: 135 mEq/L (ref 135–145)

## 2019-06-17 LAB — LIPID PANEL
Cholesterol: 306 mg/dL — ABNORMAL HIGH (ref 0–200)
HDL: 42.5 mg/dL (ref >39.00)
NonHDL: 263.7
Total CHOL/HDL Ratio: 7
Triglycerides: 215 mg/dL — ABNORMAL HIGH (ref 0.0–149.0)
VLDL: 43 mg/dL — ABNORMAL HIGH (ref 0.0–40.0)

## 2019-06-17 LAB — HEMOGLOBIN A1C: Hgb A1c MFr Bld: 7 % — ABNORMAL HIGH (ref 4.6–6.5)

## 2019-06-17 LAB — TESTOSTERONE: Testosterone: 326.28 ng/dL (ref 300.00–890.00)

## 2019-06-17 LAB — TROPONIN I (HIGH SENSITIVITY): High Sens Troponin I: 10 ng/L (ref 2–17)

## 2019-06-17 LAB — LDL CHOLESTEROL, DIRECT: Direct LDL: 220 mg/dL

## 2019-06-17 NOTE — Progress Notes (Signed)
Adam Perkins is a 68 y.o. male  Chief Complaint  Patient presents with  . Establish Care    Cpe X fasting. Flu & pneumo vaccine. shingrix? Never had colonoscopy. Pt was driving last week and had to pull over because he started to get cold sweats and vomit. no chest pain. Pt had elevated enzymes on ekg but was told normal.    HPI: Adam Perkins is a 68 y.o. male here with his wife as transfer of care from Dr. Maudie Mercury. He is due for CPE and is fasting. He had event while driving [place] where he felt diaphoretic, clammy, nauseous, "indigestion". No CP, SOB. He and wife pulled over, pt vomited x 1, and wife called 911. He went to ER in [place], [place], had normal EKG, trop 0.04 and repeat in 60-90 min was 0.06. Pt left ER and continued driving to mother's house. No further episodes. Feels fine.  Of note, pt had been out to dinner the night prior with a college friend. Pt states he drank way too much over 5-6 hrs and woke with with a hangover.  Pt is a smoker. He has cardio appt on Thurs 12/31 but needs referral.   Last Colonoscopy: never - overdue  Pt is agreeable to flu, pneumonia, and shingles vaccine today.  Past Medical History:  Diagnosis Date  . Arthritis   . Avascular necrosis of bone of right hip ([place]) 05/28/2014  . Chicken pox   . Elevated blood pressure 08/19/2012  . Hyperglycemia 12/30/2015  . Hyperlipemia 08/19/2012  . Kidney stone    nephrolithiasis  . Pain in limb 10/20/2013  . 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 CHEILECTOMY Right 04/03/2019   Procedure: RIGHT DORSAL CHEILECTOMY;  Surgeon: Erle Crocker, MD;  Location: [facility];  Service: Orthopedics;  Laterality: Right;  SURGERY REQUEST TIME 90 MIN  . TOTAL HIP ARTHROPLASTY Right 05/28/2014   Procedure: RIGHT TOTAL HIP ARTHROPLASTY ANTERIOR APPROACH;  Surgeon: Mcarthur Rossetti, MD;  Location: [facility];  Service: Orthopedics;   Laterality: Right;    Social History   Socioeconomic History  . Marital status: Married    Spouse name: Not on file  . Number of children: Not on file  . Years of education: Not on file  . Highest education level: Not on file  Occupational History  . Not on file  Tobacco Use  . Smoking status: Current Every Day Smoker    Years: 20.00    Types: Cigarettes  . Smokeless tobacco: Never Used  . Tobacco comment: quit 2012 after smoking on and off a little for year. Now current smoker  Substance and Sexual Activity  . Alcohol use: Yes    Alcohol/week: 0.0 standard drinks    Comment: 2 glasses of wine daily   . Drug use: No  . Sexual activity: Not Currently  Other Topics Concern  . Not on file  Social History Narrative  . Not on file   Social Determinants of Health   Financial Resource Strain:   . Difficulty of Paying Living Expenses: Not on file  Food Insecurity:   . Worried About Charity fundraiser in the Last Year: Not on file  . Ran Out of Food in the Last Year: Not on file  Transportation Needs:   . Lack of Transportation (Medical): Not on file  . Lack of Transportation (Non-Medical): Not on file  Physical  Activity:   . Days of Exercise per Week: Not on file  . Minutes of Exercise per Session: Not on file  Stress:   . Feeling of Stress : Not on file  Social Connections:   . Frequency of Communication with Friends and Family: Not on file  . Frequency of Social Gatherings with Friends and Family: Not on file  . Attends Religious Services: Not on file  . Active Member of Clubs or Organizations: Not on file  . Attends Archivist Meetings: Not on file  . Marital Status: Not on file  Intimate Partner Violence:   . Fear of Current or Ex-Partner: Not on file  . Emotionally Abused: Not on file  . Physically Abused: Not on file  . Sexually Abused: Not on file    Family History  Problem Relation Age of Onset  . Arthritis Father   . Cancer Father 59        bladder cancer  . Diabetes Mother      Immunization History  Administered Date(s) Administered  . Fluad Quad(high Dose 65+) 06/17/2019  . Pneumococcal Conjugate-13 06/17/2019  . Tdap 07/14/2013  . Zoster Recombinat (Shingrix) 06/17/2019    Outpatient Encounter Medications as of 06/17/2019  Medication Sig  . ascorbic acid (VITAMIN C) 500 MG tablet Take 500 mg by mouth daily.  Marland Kitchen selenium 50 MCG TABS tablet Take 50 mcg by mouth daily.  . timolol (BETIMOL) 0.25 % ophthalmic solution Place 1 drop into both eyes 2 (two) times daily.  . vitamin E 200 UNIT capsule Take 200 Units by mouth daily.   No facility-administered encounter medications on file as of 06/17/2019.     ROS: Pertinent positives and negatives noted in HPI. Remainder of ROS non-contributory + Rt foot pain s/p surgery 6 wks ago Denies HA, vision changes, URI symptoms, CP, SOB, LE edema, numbness or tingling   No Known Allergies  BP (!) 150/90   Pulse 71   Temp 97.7 F (36.5 C) (Temporal)   Ht 5\' 9"  (1.753 m)   Wt 196 lb 9.6 oz (89.2 kg)   SpO2 97%   BMI 29.03 kg/m   BP Readings from Last 3 Encounters:  06/17/19 (!) 150/90  04/03/19 (!) 158/80  12/30/15 132/80   Pulse Readings from Last 3 Encounters:  06/17/19 71  04/03/19 (!) 57  12/30/15 68    Physical Exam  Constitutional: He is oriented to person, place, and time. He appears well-developed and well-nourished. No distress.  HENT:  Head: Normocephalic and atraumatic.  Right Ear: Tympanic membrane and ear canal normal.  Left Ear: Tympanic membrane and ear canal normal.  Nose: Nose normal.  Mouth/Throat: Oropharynx is clear and moist and mucous membranes are normal.  Neck: No thyromegaly present.  Cardiovascular: Normal rate, regular rhythm, normal heart sounds and intact distal pulses.  Pulmonary/Chest: Effort normal and breath sounds normal. He has no wheezes. He has no rhonchi. He exhibits no tenderness.  Abdominal: Soft. Bowel sounds are  normal. He exhibits no distension and no mass. There is no abdominal tenderness. There is no rebound and no guarding.  Musculoskeletal:        General: Edema (Rt foot with edema secondary to foot surgery 5-6 ks ago, no calf tenderness or swelling, neg Homan's sign) present. Normal range of motion.     Cervical back: Neck supple.  Lymphadenopathy:    He has no cervical adenopathy.  Neurological: He is alert and oriented to person, place, and time. He exhibits  normal muscle tone. Coordination normal.  Skin: Skin is warm and dry.  Psychiatric: He has a normal mood and affect.     A/P:  1. Elevated troponin - seen in ER in [place], [place] on 06/11/19 due to feeling diaphoretic, clammy, nauseous, vomiting x 1 episode. Mildly elevated troponin and bump in second trop from 0.04 to 0.06. pt has cardio appt scheduled for Thurs 12/31 - Ambulatory referral to Cardiology - Troponin I (High Sensitivity)  2. Annual physical exam - immunizations today - needs colonoscopy referral - placed today - discussed importance of regular CV exercise, healthy diet, adequate sleep - Basic metabolic panel - Lipid panel - Testosterone - pt taking OTC testosterone supplement, unclear for what reason  3. Mixed hyperlipidemia - not on statin - Lipid panel  4. Hyperglycemia - Hemoglobin A1c  5. Need for pneumococcal vaccination - Pneumococcal conjugate vaccine 13-valent IM  6. Screening for colon cancer - Colonoscopy Referral  7. Need for influenza vaccination - Flu Vaccine QUAD High Dose(Fluad)  8. Tobacco use disorder - pt is not interested in quitting at this time - does not appear pt has had AAA screening ? But will hold off on this as pts priority is colonoscopy and cardio f/u  9. Elevated BP without a diagnosis of HTN - not on med - pt is a smoker - last A1C in prediabetes range - f/u appt in 2 wks to discuss BP and AAA screening  I personally spent 45 min with the patient today and greater  than 50% was spent in counseling, coordination of care, education

## 2019-06-18 NOTE — Progress Notes (Signed)
Cardiology Office Note:   Date:  06/19/2019  NAME:  Adam Perkins    MRN: [id number] DOB:  11/10/1950   PCP:  Ronnald Nian, DO  Cardiologist:  Evalina Field, MD   Referring MD: Ronnald Nian, DO   Chief Complaint  Patient presents with  . Chest Pain   History of Present Illness:   Adam Perkins is a 68 y.o. male with a hx of diabetes (A1c 7.0), HLD, HTN who is being seen today for the evaluation of elevated troponin at the request of Ronnald Nian, DO. Evaluated in ER in [place] on 12/23 for CP after alcohol use. Had mild troponin elevation 0.04->0.06. Wanted to go home and left. No documentation of EKG. Seen by PCP yesterday. Now with diabetes and significant hyperlipidemia.   He reports he was visiting a friend in [place] on 12/22.  He reports a night of heavy drinking.  The next day on 12/23 he had an episode of nausea, sweaty/fatigue while driving.  He reports the episodes lasted around 45 minutes and then resolved.  He reports no chest pain or chest pressure during the episode.  He does report some shortness of breath.  The episode resolved and he resumed his drive to [facility].  During the second route of his drive to [place], he had another episode of 15 minutes of similar sweaty, nausea and vomiting.  He had no chest pain or pressure.  He reports deep breathing helped his symptoms.  He was evaluated in the emergency room in [facility] and had a minimal troponin elevation 0.04-0.06.  His EKG was without ischemic changes.  He was discharged home with close follow-up today.  Labs obtained by PCP showed very significant A1c and extremely high cholesterol.  He reports 25 years of smoking 1 pack/day.  He has no symptoms of claudication but he has terrible pulses on examination.  He reports he exercises 3 to 4 days/week but this is mainly weightlifting.  He has no symptoms of chest pain or chest pressure with this.  He is a retired Designer, fashion/clothing.  He reports that he has not regularly seen a doctor as he had no need.  No strong family history of cardiovascular disease.  CVD risk factors include: Diabetes, hypertension, tobacco abuse, hyper lipidemia  A1c 7.0, T chol 306, TG 215, HDL 42,LDL 220, Cr 0.74  Past Medical History: Past Medical History:  Diagnosis Date  . Arthritis   . Avascular necrosis of bone of right hip ([place]) 05/28/2014  . Chicken pox   . Diabetes mellitus without complication ([place])   . Elevated blood pressure 08/19/2012  . Hyperglycemia 12/30/2015  . Hyperlipemia 08/19/2012  . Hypertension   . Kidney stone    nephrolithiasis  . Pain in limb 10/20/2013  . 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 CHEILECTOMY Right 04/03/2019   Procedure: RIGHT DORSAL CHEILECTOMY;  Surgeon: Erle Crocker, MD;  Location: [facility];  Service: Orthopedics;  Laterality: Right;  SURGERY REQUEST TIME 90 MIN  . TOTAL HIP ARTHROPLASTY Right 05/28/2014   Procedure: RIGHT TOTAL HIP ARTHROPLASTY ANTERIOR APPROACH;  Surgeon: Mcarthur Rossetti, MD;  Location: [facility];  Service: Orthopedics;  Laterality: Right;    Current Medications: Current Meds  Medication Sig  . ascorbic acid (VITAMIN C) 500 MG tablet Take 500 mg by mouth daily.  Marland Kitchen  aspirin EC 81 MG tablet Take 81 mg by mouth daily.  Marland Kitchen atorvastatin (LIPITOR) 40 MG tablet Take 1 tablet (40 mg total) by mouth daily.  Marland Kitchen lisinopril (ZESTRIL) 10 MG tablet Take 1 tablet (10 mg total) by mouth daily.  . metFORMIN (GLUCOPHAGE XR) 500 MG 24 hr tablet Take 1 tablet (500 mg total) by mouth daily with breakfast.  . selenium 50 MCG TABS tablet Take 50 mcg by mouth daily.  . timolol (BETIMOL) 0.25 % ophthalmic solution Place 1 drop into both eyes 2 (two) times daily.  . vitamin E 200 UNIT capsule Take 200 Units by mouth daily.     Allergies:    Patient has no known  allergies.   Social History: Social History   Socioeconomic History  . Marital status: Married    Spouse name: Not on file  . Number of children: 2  . Years of education: Not on file  . Highest education level: Not on file  Occupational History  . Not on file  Tobacco Use  . Smoking status: Current Every Day Smoker    Packs/day: 1.00    Years: 25.00    Pack years: 25.00    Types: Cigarettes  . Smokeless tobacco: Never Used  . Tobacco comment: quit 2012 after smoking on and off a little for year. Now current smoker  Substance and Sexual Activity  . Alcohol use: Yes    Alcohol/week: 0.0 standard drinks    Comment: 2 glasses of wine daily   . Drug use: No  . Sexual activity: Not Currently  Other Topics Concern  . Not on file  Social History Narrative  . Not on file   Social Determinants of Health   Financial Resource Strain:   . Difficulty of Paying Living Expenses: Not on file  Food Insecurity:   . Worried About Charity fundraiser in the Last Year: Not on file  . Ran Out of Food in the Last Year: Not on file  Transportation Needs:   . Lack of Transportation (Medical): Not on file  . Lack of Transportation (Non-Medical): Not on file  Physical Activity:   . Days of Exercise per Week: Not on file  . Minutes of Exercise per Session: Not on file  Stress:   . Feeling of Stress : Not on file  Social Connections:   . Frequency of Communication with Friends and Family: Not on file  . Frequency of Social Gatherings with Friends and Family: Not on file  . Attends Religious Services: Not on file  . Active Member of Clubs or Organizations: Not on file  . Attends Archivist Meetings: Not on file  . Marital Status: Not on file     Family History: The patient's family history includes Arthritis in his father; Cancer (age of onset: 2) in his father; Diabetes in his mother.  ROS:   All other ROS reviewed and negative. Pertinent positives noted in the HPI.      EKGs/Labs/Other Studies Reviewed:   The following studies were personally reviewed by me today:  EKG:  EKG is ordered today.  The ekg ordered today demonstrates normal sinus rhythm, heart rate 76, no acute ischemic changes, no evidence of prior infarction, and was personally reviewed by me.   Recent Labs: 06/17/2019: BUN 14; Creatinine, Ser 0.74; Potassium 4.3; Sodium 135   Recent Lipid Panel    Component Value Date/Time   CHOL 306 (H) 06/17/2019 1114   TRIG 215.0 (H) 06/17/2019 1114  HDL 42.50 06/17/2019 1114   CHOLHDL 7 06/17/2019 1114   VLDL 43.0 (H) 06/17/2019 1114   LDLDIRECT 220.0 06/17/2019 1114    Physical Exam:   VS:  BP (!) 155/89   Pulse 76   Temp (!) 96.8 F (36 C)   Ht 5\' 9"  (1.753 m)   Wt 194 lb 9.6 oz (88.3 kg)   SpO2 98%   BMI 28.74 kg/m    Wt Readings from Last 3 Encounters:  06/19/19 194 lb 9.6 oz (88.3 kg)  06/17/19 196 lb 9.6 oz (89.2 kg)  04/03/19 192 lb 3.9 oz (87.2 kg)    General: Well nourished, well developed, in no acute distress Heart: Atraumatic, normal size  Eyes: PEERLA, EOMI  Neck: Supple, no JVD Endocrine: No thryomegaly Cardiac: Normal S1, S2; RRR; no murmurs, rubs, or gallops Lungs: Clear to auscultation bilaterally, no wheezing, rhonchi or rales  Abd: Soft, nontender, no hepatomegaly  Ext: No edema, absent pulses in the lower extremities Musculoskeletal: No deformities, BUE and BLE strength normal and equal Skin: Warm and dry, no rashes   Neuro: Alert and oriented to person, place, time, and situation, CNII-XII grossly intact, no focal deficits  Psych: Normal mood and affect   ASSESSMENT:   Adam Perkins is a 68 y.o. male who presents for the following: 1. Other chest pain   2. Essential hypertension   3. Mixed hyperlipidemia   4. Type 2 diabetes mellitus without complication, without long-term current use of insulin ([place])   5. Tobacco abuse   6. Precordial pain     PLAN:   1. Other chest pain -Symptoms are concerning  for obstructive CAD.  He has extensive smoking history, now diabetic, now with significant hyperlipidemia. -Agree with aspirin 81 mg daily -Start Lipitor 40 mg daily -We will obtain an echocardiogram next week -Also will obtain a Lexi nuclear medicine stress test  2. Essential hypertension -BP up and agree with starting lisinopril 10 mg daily per PCP  3. Mixed hyperlipidemia -LDL 220 and diabetic -High intensity statin -I suspect he will have CAD as well -LDL goal less than 70  4. Type 2 diabetes mellitus without complication, without long-term current use of insulin ([facility]) -Start Metformin -Aspirin statin as above  5. Tobacco abuse -Really needs to quit smoking and advised that the day -We will also plan to check ABIs as he has absent pulses in lower extremities  6. Precordial pain -As above  Disposition: Return in about 1 month (around 07/20/2019).  Medication Adjustments/Labs and Tests Ordered: Current medicines are reviewed at length with the patient today.  Concerns regarding medicines are outlined above.  Orders Placed This Encounter  Procedures  . Myocardial Perfusion Imaging  . EKG 12-Lead  . ECHOCARDIOGRAM COMPLETE   No orders of the defined types were placed in this encounter.   Patient Instructions  Medication Instructions:  No changes *If you need a refill on your cardiac medications before your next appointment, please call your pharmacy*  Lab Work: None needed If you have labs (blood work) drawn today and your tests are completely normal, you will receive your results only by: Marland Kitchen MyChart Message (if you have MyChart) OR . A paper copy in the mail If you have any lab test that is abnormal or we need to change your treatment, we will call you to review the results.  Testing/Procedures: Your physician has requested that you have an echocardiogram. Echocardiography is a painless test that uses sound waves to create images  of your heart. It provides your  doctor with information about the size and shape of your heart and how well your heart's chambers and valves are working. This procedure takes approximately one hour. There are no restrictions for this procedure.  Your physician has requested that you have a lexiscan myoview. For further information please visit [email or web address]. Please follow instruction sheet, as given. Nothing to eat or drink 3 hours prior to the test. No caffeine for 12 hours prior to the test.    Follow-Up: At [facility], you and your health needs are our priority.  As part of our continuing mission to provide you with exceptional heart care, we have created designated Provider Care Teams.  These Care Teams include your primary Cardiologist (physician) and Advanced Practice Providers (APPs -  Physician Assistants and Nurse Practitioners) who all work together to provide you with the care you need, when you need it.  Your next appointment:   1 month(s)  The format for your next appointment:   In Person  Provider:   Eleonore Chiquito, MD       Signed, Addison Naegeli. Audie Box, [place]  [address] [place], [place] [address] [phone]  06/19/2019 3:07 PM

## 2019-06-19 ENCOUNTER — Encounter: Payer: Self-pay | Admitting: Cardiovascular Disease

## 2019-06-19 ENCOUNTER — Other Ambulatory Visit: Payer: Self-pay

## 2019-06-19 ENCOUNTER — Encounter: Payer: Self-pay | Admitting: Family Medicine

## 2019-06-19 ENCOUNTER — Ambulatory Visit: Payer: Medicare Other | Admitting: Cardiovascular Disease

## 2019-06-19 VITALS — BP 155/89 | HR 76 | Temp 96.8°F | Ht 69.0 in | Wt 194.6 lb

## 2019-06-19 DIAGNOSIS — I10 Essential (primary) hypertension: Secondary | ICD-10-CM

## 2019-06-19 DIAGNOSIS — E782 Mixed hyperlipidemia: Secondary | ICD-10-CM

## 2019-06-19 DIAGNOSIS — E119 Type 2 diabetes mellitus without complications: Secondary | ICD-10-CM

## 2019-06-19 DIAGNOSIS — E1169 Type 2 diabetes mellitus with other specified complication: Secondary | ICD-10-CM

## 2019-06-19 DIAGNOSIS — R0789 Other chest pain: Secondary | ICD-10-CM | POA: Diagnosis not present

## 2019-06-19 DIAGNOSIS — Z720 Tobacco use: Secondary | ICD-10-CM

## 2019-06-19 DIAGNOSIS — R072 Precordial pain: Secondary | ICD-10-CM

## 2019-06-19 MED ORDER — METFORMIN HCL ER 500 MG PO TB24: 500.0000 mg | ORAL_TABLET | Freq: Every day | ORAL | 3 refills | Status: DC

## 2019-06-19 MED ORDER — LISINOPRIL 10 MG PO TABS: 10.0000 mg | ORAL_TABLET | Freq: Every day | ORAL | 3 refills | Status: DC

## 2019-06-19 MED ORDER — ATORVASTATIN CALCIUM 40 MG PO TABS: 40.0000 mg | ORAL_TABLET | Freq: Every day | ORAL | 3 refills | Status: DC

## 2019-06-19 MED FILL — METFORMIN HCL ER 500 MG TB2: 500 | 90 days supply | Qty: 90 | Fill #0

## 2019-06-19 MED FILL — ATORVASTATIN 40 MG TABLET: 40 | 90 days supply | Qty: 90 | Fill #0

## 2019-06-19 MED FILL — LISINOPRIL 10 MG TABS: 10 | 90 days supply | Qty: 90 | Fill #0

## 2019-06-19 NOTE — Patient Instructions (Addendum)
Medication Instructions:  No changes *If you need a refill on your cardiac medications before your next appointment, please call your pharmacy*  Lab Work: None needed If you have labs (blood work) drawn today and your tests are completely normal, you will receive your results only by: Marland Kitchen MyChart Message (if you have MyChart) OR . A paper copy in the mail If you have any lab test that is abnormal or we need to change your treatment, we will call you to review the results.  Testing/Procedures: Your physician has requested that you have an echocardiogram. Echocardiography is a painless test that uses sound waves to create images of your heart. It provides your doctor with information about the size and shape of your heart and how well your heart's chambers and valves are working. This procedure takes approximately one hour. There are no restrictions for this procedure.  Your physician has requested that you have a lexiscan myoview. For further information please visit [email or web address]. Please follow instruction sheet, as given. Nothing to eat or drink 3 hours prior to the test. No caffeine for 12 hours prior to the test.    Follow-Up: At [facility], you and your health needs are our priority.  As part of our continuing mission to provide you with exceptional heart care, we have created designated Provider Care Teams.  These Care Teams include your primary Cardiologist (physician) and Advanced Practice Providers (APPs -  Physician Assistants and Nurse Practitioners) who all work together to provide you with the care you need, when you need it.  Your next appointment:   1 month(s)  The format for your next appointment:   In Person  Provider:   Eleonore Chiquito, MD

## 2019-06-24 ENCOUNTER — Telehealth (HOSPITAL_COMMUNITY): Payer: Self-pay

## 2019-06-24 NOTE — Telephone Encounter (Signed)
Encounter complete. 

## 2019-06-25 ENCOUNTER — Ambulatory Visit (HOSPITAL_COMMUNITY)
Admission: RE | Admit: 2019-06-25 | Discharge: 2019-06-25 | Disposition: A | Discharge status: Home / Self Care | Payer: Medicare Other | Source: Ambulatory Visit | Attending: Cardiology | Admitting: Cardiology

## 2019-06-25 ENCOUNTER — Other Ambulatory Visit: Payer: Self-pay

## 2019-06-25 DIAGNOSIS — R072 Precordial pain: Secondary | ICD-10-CM | POA: Diagnosis not present

## 2019-06-25 LAB — MYOCARDIAL PERFUSION IMAGING
LV dias vol: 93 mL (ref 62–150)
LV sys vol: 40 mL
Peak HR: 83 {beats}/min
Rest HR: 58 {beats}/min
SDS: 3
SRS: 0
SSS: 3
TID: 1.02

## 2019-06-25 MED ORDER — TECHNETIUM TC 99M TETROFOSMIN IV KIT
31.4000 | PACK | Freq: Once | INTRAVENOUS | Status: AC | PRN
  Administered 2019-06-25: 31.4 via INTRAVENOUS
  Filled 2019-06-25: qty 32

## 2019-06-25 MED ORDER — REGADENOSON 0.4 MG/5ML IV SOLN
0.4000 mg | Freq: Once | INTRAVENOUS | Status: AC
  Administered 2019-06-25: 0.4 mg via INTRAVENOUS

## 2019-06-25 MED ORDER — TECHNETIUM TC 99M TETROFOSMIN IV KIT
9.9000 | PACK | Freq: Once | INTRAVENOUS | Status: AC | PRN
  Administered 2019-06-25: 9.9 via INTRAVENOUS
  Filled 2019-06-25: qty 10

## 2019-06-26 ENCOUNTER — Telehealth: Payer: Self-pay | Admitting: *Deleted

## 2019-06-26 ENCOUNTER — Telehealth: Payer: Self-pay | Admitting: Cardiovascular Disease

## 2019-06-26 NOTE — Telephone Encounter (Signed)
Called. -W

## 2019-06-26 NOTE — Telephone Encounter (Signed)
Attempted to call Mr. Vespa about stress test. Left message to call us back.   [place] T. Audie Box, [place]  [address], [place], [place] [address] [phone]  1:12 PM

## 2019-06-26 NOTE — Telephone Encounter (Addendum)
Pt has a PV scheduled for 1-18 with a colon 1-29 Friday - pt 12-23 In the ED with Chest pain , had elevated Troponins-  Pt did follow up with Cardio 12-31- He had a stress test that was low risk , Ef 57%-- he has an Echo scheduled- he has another cardiac office visit 1-22- pt to get complete cardiac clearance prior to a colon- recall placed for 08-2019- pt states will CB to RS once has total clearance

## 2019-06-26 NOTE — Telephone Encounter (Signed)
Called Mr. Chuang. Normal stress test. He is clear to have a colonoscopy. Will route message to his Gastro team.   [place] T. Audie Box, [place]  [address], [place], [place] [address] [phone]  6:15 PM

## 2019-06-26 NOTE — Telephone Encounter (Signed)
Patient returned call

## 2019-06-27 ENCOUNTER — Ambulatory Visit (HOSPITAL_COMMUNITY): Payer: Medicare Other | Attending: Cardiology

## 2019-06-27 ENCOUNTER — Other Ambulatory Visit: Payer: Self-pay

## 2019-06-27 ENCOUNTER — Encounter: Payer: Self-pay | Admitting: *Deleted

## 2019-06-27 DIAGNOSIS — R072 Precordial pain: Secondary | ICD-10-CM | POA: Insufficient documentation

## 2019-06-27 DIAGNOSIS — R0789 Other chest pain: Secondary | ICD-10-CM | POA: Insufficient documentation

## 2019-06-27 NOTE — Progress Notes (Signed)
Geralynn Rile, MD  [place], [place]:   Mr. Morganelli had a normal cardiac stress test. He is OK to proceed with colonoscopy. Please let me know if further clarification is needed.   [place] T. Audie Box, [place]  [address] [place]  [place], [place] [address]  [phone]  6:16 PM

## 2019-07-10 NOTE — Progress Notes (Signed)
Cardiology Office Note:   Date:  07/11/2019  NAME:  Adam Perkins    MRN: [id number] DOB:  Jul 23, 1950   PCP:  Ronnald Nian, DO  Cardiologist:  Evalina Field, MD  Referring MD: Ronnald Nian, DO   Chief Complaint  Patient presents with  . Follow-up   History of Present Illness:   Adam Perkins is a 69 y.o. male with a hx of diabetes, hypertension, hyperlipidemia who presents for follow-up.  He was recently evaluated after dizziness and lightheadedness and presyncope.  Was evaluated in [place] in emergency room with mildly elevated cardiac enzymes.  He apparently had a heavy night of drinking and was hung over the day of the event.  He underwent cardiac stress test which was normal as well as an echocardiogram which revealed mild aortic stenosis.  Laboratory data significant for A1c 7.0 and very elevated lipid panel, LDL 220.  He was started on Lipitor 40 mg daily.  He presents for follow-up today.   He reports he is doing well since starting on his diabetes medications of his Lipitor.  Blood pressure is a bit better today than the last visit.  He reports his blood pressure is a bit better at home in the 120/60 range.  He reports there may be an element of whitecoat hypertension.  He is reassured that his echocardiogram as well as cardiac stress test were normal.  He is working on getting his cholesterol under better control.  He is also working on diet and improving exercise.  He does report symptoms of cramping in his thighs and buttocks with exertion.  He does have a extensive smoking history and continues to smoke.  He reports the pain is leg and thigh area resolve with cessation of activity.  He reports he has had both hips replaced and there is concern for arthritis.  I did inform him that he could possibly have PAD.  He is interested in screening for this.  He does have diminished pulses on examination which is also concerning.  He continues to do well and denies any chest pain or  trouble breathing.  He has several things to work on this new year.  Problem List 1. Diabetes (A1c 7.0) 2. Hypertension 3. Hyperlipidemia (T chol 306, TG 216, HDL 42, LDL 220) 4. PAD  5. Tobacco abuse (25 pack-years)  Past Medical History: Past Medical History:  Diagnosis Date  . Arthritis   . Avascular necrosis of bone of right hip ([place]) 05/28/2014  . Chicken pox   . Diabetes mellitus without complication ([place])   . Elevated blood pressure 08/19/2012  . Hyperglycemia 12/30/2015  . Hyperlipemia 08/19/2012  . Hypertension   . Kidney stone    nephrolithiasis  . Pain in limb 10/20/2013  . 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 CHEILECTOMY Right 04/03/2019   Procedure: RIGHT DORSAL CHEILECTOMY;  Surgeon: Erle Crocker, MD;  Location: [facility];  Service: Orthopedics;  Laterality: Right;  SURGERY REQUEST TIME 90 MIN  . TOTAL HIP ARTHROPLASTY Right 05/28/2014   Procedure: RIGHT TOTAL HIP ARTHROPLASTY ANTERIOR APPROACH;  Surgeon: Mcarthur Rossetti, MD;  Location: [facility];  Service: Orthopedics;  Laterality: Right;    Current Medications: Current Meds  Medication Sig  . ascorbic acid (VITAMIN C) 500 MG tablet Take 500 mg by mouth daily.  Marland Kitchen aspirin EC 81 MG tablet Take 81 mg  by mouth daily.  Marland Kitchen atorvastatin (LIPITOR) 40 MG tablet Take 1 tablet (40 mg total) by mouth daily.  Marland Kitchen lisinopril (ZESTRIL) 10 MG tablet Take 1 tablet (10 mg total) by mouth daily.  . metFORMIN (GLUCOPHAGE XR) 500 MG 24 hr tablet Take 1 tablet (500 mg total) by mouth daily with breakfast.  . selenium 50 MCG TABS tablet Take 50 mcg by mouth daily.  . timolol (BETIMOL) 0.25 % ophthalmic solution Place 1 drop into both eyes 2 (two) times daily.  . vitamin E 200 UNIT capsule Take 200 Units by mouth daily.     Allergies:    Patient has no known allergies.   Social History: Social History    Socioeconomic History  . Marital status: Married    Spouse name: Not on file  . Number of children: 2  . Years of education: Not on file  . Highest education level: Not on file  Occupational History  . Not on file  Tobacco Use  . Smoking status: Current Every Day Smoker    Packs/day: 1.00    Years: 25.00    Pack years: 25.00    Types: Cigarettes  . Smokeless tobacco: Never Used  . Tobacco comment: quit 2012 after smoking on and off a little for year. Now current smoker  Substance and Sexual Activity  . Alcohol use: Yes    Alcohol/week: 0.0 standard drinks    Comment: 2 glasses of wine daily   . Drug use: No  . Sexual activity: Not Currently  Other Topics Concern  . Not on file  Social History Narrative  . Not on file   Social Determinants of Health   Financial Resource Strain:   . Difficulty of Paying Living Expenses: Not on file  Food Insecurity:   . Worried About Charity fundraiser in the Last Year: Not on file  . Ran Out of Food in the Last Year: Not on file  Transportation Needs:   . Lack of Transportation (Medical): Not on file  . Lack of Transportation (Non-Medical): Not on file  Physical Activity:   . Days of Exercise per Week: Not on file  . Minutes of Exercise per Session: Not on file  Stress:   . Feeling of Stress : Not on file  Social Connections:   . Frequency of Communication with Friends and Family: Not on file  . Frequency of Social Gatherings with Friends and Family: Not on file  . Attends Religious Services: Not on file  . Active Member of Clubs or Organizations: Not on file  . Attends Archivist Meetings: Not on file  . Marital Status: Not on file     Family History: The patient's family history includes Arthritis in his father; Cancer (age of onset: 28) in his father; Diabetes in his mother.  ROS:   All other ROS reviewed and negative. Pertinent positives noted in the HPI.     EKGs/Labs/Other Studies Reviewed:   The following  studies were personally reviewed by me today:  Nuc Med MPI  Nuclear stress EF: 57%. The left ventricular ejection fraction is normal (55-65%).  There was no ST segment deviation noted during stress.  This is a low risk study. There is no evidence of ischemia. There is no evidence of previous myocardial infarction.  The study is normal.   Echo 06/27/2019  1. Left ventricular ejection fraction, by visual estimation, is 60 to 65%. The left ventricle has normal function. There is mildly increased left ventricular  hypertrophy.  2. Left ventricular diastolic parameters are consistent with Grade I diastolic dysfunction (impaired relaxation).  3. The left ventricle has no regional wall motion abnormalities.  4. Global right ventricle has normal systolic function.The right ventricular size is normal. No increase in right ventricular wall thickness.  5. Left atrial size was mildly dilated.  6. Right atrial size was normal.  7. Mild mitral annular calcification.  8. The mitral valve is normal in structure. No evidence of mitral valve regurgitation. No evidence of mitral stenosis.  9. The tricuspid valve is normal in structure. 10. The aortic valve is tricuspid. Aortic valve regurgitation is not visualized. Mild aortic valve stenosis. Mean gradient 10 mmHg, AVA 2.0 cm^2. 11. The inferior vena cava is normal in size with greater than 50% respiratory variability, suggesting right atrial pressure of 3 mmHg. 12. TR signal is inadequate for assessing pulmonary artery systolic pressure.  Recent Labs: 06/17/2019: BUN 14; Creatinine, Ser 0.74; Potassium 4.3; Sodium 135   Recent Lipid Panel    Component Value Date/Time   CHOL 306 (H) 06/17/2019 1114   TRIG 215.0 (H) 06/17/2019 1114   HDL 42.50 06/17/2019 1114   CHOLHDL 7 06/17/2019 1114   VLDL 43.0 (H) 06/17/2019 1114   LDLDIRECT 220.0 06/17/2019 1114    Physical Exam:   VS:  BP (!) 141/56   Pulse 63   Ht 5\' 9"  (1.753 m)   Wt 193 lb 9.6 oz (87.8  kg)   SpO2 99%   BMI 28.59 kg/m    Wt Readings from Last 3 Encounters:  07/11/19 193 lb 9.6 oz (87.8 kg)  06/25/19 194 lb (88 kg)  06/19/19 194 lb 9.6 oz (88.3 kg)    General: Well nourished, well developed, in no acute distress Heart: Atraumatic, normal size  Eyes: PEERLA, EOMI  Neck: Supple, no JVD Endocrine: No thryomegaly Cardiac: Normal S1, S2; RRR; no murmurs, rubs, or gallops Lungs: Clear to auscultation bilaterally, no wheezing, rhonchi or rales  Abd: Soft, nontender, no hepatomegaly  Ext: No edema, diminished pulses bilaterally Musculoskeletal: No deformities, BUE and BLE strength normal and equal Skin: Warm and dry, no rashes   Neuro: Alert and oriented to person, place, time, and situation, CNII-XII grossly intact, no focal deficits  Psych: Normal mood and affect   ASSESSMENT:   Adam Perkins is a 69 y.o. male who presents for the following: 1. Essential hypertension   2. Mixed hyperlipidemia   3. Claudication in peripheral vascular disease ([place])     PLAN:   1. Essential hypertension -Blood pressure much better.  Normal echo and normal cardiac stress test.  BP a bit up but he reports whitecoat hypertension.  No change in medication.  2. Mixed hyperlipidemia -Severely elevated LDL cholesterol, 220.  No family history of early CAD or severely elevated cholesterol.  I suspect this is all dietary and diabetes driven.  I do not suspect he has familial hypercholesterolemia or genetic syndrome.  We will continue his Lipitor 40 mg daily.  He will need to have his cholesterol panel rechecked in mid February.  3. Claudication in peripheral vascular disease ([place]) -He does report symptoms of cramping pain in his thighs legs and buttocks with exercise.  He does have diminished pulses on examination.  Given his heavy smoking history I think we need to screen him for PAD.  This will include an abdominal aorta ultrasound as well as ABIs with arterial duplex of the lower extremities.   We need to exclude any significant  PAD given his symptoms.  Disposition: Return in about 3 months (around 10/09/2019).  Medication Adjustments/Labs and Tests Ordered: Current medicines are reviewed at length with the patient today.  Concerns regarding medicines are outlined above.  Orders Placed This Encounter  Procedures  . VAS [place] ABI WITH/WO TBI  . VAS [place] AAA DUPLEX   No orders of the defined types were placed in this encounter.   Patient Instructions  Medication Instructions:  The current medical regimen is effective;  continue present plan and medications.  *If you need a refill on your cardiac medications before your next appointment, please call your pharmacy*  Testing/Procedures: Your physician has requested that you have an abdominal aorta duplex. During this test, an ultrasound is used to evaluate the aorta. Allow 30 minutes for this exam. Do not eat after midnight the day before and avoid carbonated beverages  Your physician has requested that you have an ankle brachial index (ABI). During this test an ultrasound and blood pressure cuff are used to evaluate the arteries that supply the arms and legs with blood. Allow thirty minutes for this exam. There are no restrictions or special instructions.  Follow-Up: At [facility], you and your health needs are our priority.  As part of our continuing mission to provide you with exceptional heart care, we have created designated Provider Care Teams.  These Care Teams include your primary Cardiologist (physician) and Advanced Practice Providers (APPs -  Physician Assistants and Nurse Practitioners) who all work together to provide you with the care you need, when you need it.  Your next appointment:   3 month(s)  The format for your next appointment:   In Person  Provider:   Eleonore Chiquito, MD      Signed, Addison Naegeli. Audie Box, [place]  [address], [place], [place] [address] [phone]  07/11/2019 11:19 AM

## 2019-07-11 ENCOUNTER — Other Ambulatory Visit: Payer: Self-pay

## 2019-07-11 ENCOUNTER — Ambulatory Visit: Payer: Medicare Other | Admitting: Cardiovascular Disease

## 2019-07-11 ENCOUNTER — Encounter: Payer: Self-pay | Admitting: Cardiovascular Disease

## 2019-07-11 VITALS — BP 141/56 | HR 63 | Ht 69.0 in | Wt 193.6 lb

## 2019-07-11 DIAGNOSIS — I10 Essential (primary) hypertension: Secondary | ICD-10-CM | POA: Diagnosis not present

## 2019-07-11 DIAGNOSIS — I739 Peripheral vascular disease, unspecified: Secondary | ICD-10-CM

## 2019-07-11 DIAGNOSIS — E782 Mixed hyperlipidemia: Secondary | ICD-10-CM | POA: Diagnosis not present

## 2019-07-11 NOTE — Patient Instructions (Signed)
Medication Instructions:  The current medical regimen is effective;  continue present plan and medications.  *If you need a refill on your cardiac medications before your next appointment, please call your pharmacy*  Testing/Procedures: Your physician has requested that you have an abdominal aorta duplex. During this test, an ultrasound is used to evaluate the aorta. Allow 30 minutes for this exam. Do not eat after midnight the day before and avoid carbonated beverages  Your physician has requested that you have an ankle brachial index (ABI). During this test an ultrasound and blood pressure cuff are used to evaluate the arteries that supply the arms and legs with blood. Allow thirty minutes for this exam. There are no restrictions or special instructions.  Follow-Up: At [facility], you and your health needs are our priority.  As part of our continuing mission to provide you with exceptional heart care, we have created designated Provider Care Teams.  These Care Teams include your primary Cardiologist (physician) and Advanced Practice Providers (APPs -  Physician Assistants and Nurse Practitioners) who all work together to provide you with the care you need, when you need it.  Your next appointment:   3 month(s)  The format for your next appointment:   In Person  Provider:   Eleonore Chiquito, MD

## 2019-07-14 ENCOUNTER — Other Ambulatory Visit (HOSPITAL_COMMUNITY): Payer: Self-pay | Admitting: Cardiovascular Disease

## 2019-07-14 ENCOUNTER — Other Ambulatory Visit (HOSPITAL_COMMUNITY): Payer: Self-pay | Admitting: Cardiology

## 2019-07-14 DIAGNOSIS — I739 Peripheral vascular disease, unspecified: Secondary | ICD-10-CM

## 2019-07-15 ENCOUNTER — Other Ambulatory Visit (HOSPITAL_COMMUNITY): Payer: Self-pay | Admitting: Cardiovascular Disease

## 2019-07-15 DIAGNOSIS — I700 Atherosclerosis of aorta: Secondary | ICD-10-CM

## 2019-07-16 NOTE — Patient Instructions (Addendum)
Health Maintenance Due  Topic Date Due  . FOOT EXAM  02/09/1961  . OPHTHALMOLOGY EXAM Pt has an appointment in Feb 02/09/1961  . COLONOSCOPY Postponed until after heart testing 02/09/2001    Depression screen PHQ 2/9 06/17/2019  Decreased Interest 0  Down, Depressed, Hopeless 0  PHQ - 2 Score 0

## 2019-07-17 ENCOUNTER — Other Ambulatory Visit: Payer: Self-pay

## 2019-07-17 ENCOUNTER — Ambulatory Visit (INDEPENDENT_AMBULATORY_CARE_PROVIDER_SITE_OTHER): Payer: Medicare Other | Admitting: Family Medicine

## 2019-07-17 ENCOUNTER — Encounter: Payer: Self-pay | Admitting: Family Medicine

## 2019-07-17 VITALS — BP 122/78 | HR 70 | Temp 96.1°F | Ht 69.0 in | Wt 193.4 lb

## 2019-07-17 DIAGNOSIS — E1169 Type 2 diabetes mellitus with other specified complication: Secondary | ICD-10-CM

## 2019-07-17 DIAGNOSIS — E119 Type 2 diabetes mellitus without complications: Secondary | ICD-10-CM

## 2019-07-17 DIAGNOSIS — I10 Essential (primary) hypertension: Secondary | ICD-10-CM | POA: Diagnosis not present

## 2019-07-17 DIAGNOSIS — E785 Hyperlipidemia, unspecified: Secondary | ICD-10-CM

## 2019-07-17 NOTE — Progress Notes (Signed)
Adam Perkins is a 69 y.o. male  Chief Complaint  Patient presents with  . Follow-up    Patient is here todya to F/U Type 2 diabetes,  pt said he is due for 2nd shngrix injection. Pt has a eye exam scheduled for Feb   Pt would like to discuss Type 2 diabetes dx.    HPI: WARDIE WION is a 69 y.o. male here requesting a referral to nutrition to discuss proper diet/diet options to help improve his blood sugar and cholesterol. Pt with A1C = 7.0 in 05/2019. He is taking metformin XR 500mg  daily. He denies any side effects. Pt also with HTN and recently started on lisinopril 10mg  daily. BP much-improved today.  Home BP log: 118-145/56-78 Pt also taking lipitor 40mg  daily and well as ASA 81mg  daily.  He has DM eye exam scheduled in 07/2019.  He has shingrix #1 on 12/29 so #2 needs to be 8wks to 6 mo after #1.   Past Medical History:  Diagnosis Date  . Arthritis   . Avascular necrosis of bone of right hip ([place]) 05/28/2014  . Chicken pox   . Diabetes mellitus without complication ([place])   . Elevated blood pressure 08/19/2012  . Hyperglycemia 12/30/2015  . Hyperlipemia 08/19/2012  . Hypertension   . Kidney stone    nephrolithiasis  . Pain in limb 10/20/2013  . 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 CHEILECTOMY Right 04/03/2019   Procedure: RIGHT DORSAL CHEILECTOMY;  Surgeon: Erle Crocker, MD;  Location: [facility];  Service: Orthopedics;  Laterality: Right;  SURGERY REQUEST TIME 90 MIN  . TOTAL HIP ARTHROPLASTY Right 05/28/2014   Procedure: RIGHT TOTAL HIP ARTHROPLASTY ANTERIOR APPROACH;  Surgeon: Mcarthur Rossetti, MD;  Location: [facility];  Service: Orthopedics;  Laterality: Right;    Social History   Socioeconomic History  . Marital status: Married    Spouse name: Not on file  . Number of children: 2  . Years of education: Not on file  . Highest education level: Not on file    Occupational History  . Not on file  Tobacco Use  . Smoking status: Current Every Day Smoker    Packs/day: 1.00    Years: 25.00    Pack years: 25.00    Types: Cigarettes  . Smokeless tobacco: Never Used  . Tobacco comment: quit 2012 after smoking on and off a little for year. Now current smoker  Substance and Sexual Activity  . Alcohol use: Yes    Alcohol/week: 0.0 standard drinks    Comment: 2 glasses of wine daily   . Drug use: No  . Sexual activity: Not Currently  Other Topics Concern  . Not on file  Social History Narrative  . Not on file   Social Determinants of Health   Financial Resource Strain:   . Difficulty of Paying Living Expenses: Not on file  Food Insecurity:   . Worried About Charity fundraiser in the Last Year: Not on file  . Ran Out of Food in the Last Year: Not on file  Transportation Needs:   . Lack of Transportation (Medical): Not on file  . Lack of Transportation (Non-Medical): Not on file  Physical Activity:   . Days of Exercise per Week: Not on file  . Minutes of Exercise per Session: Not on file  Stress:   . Feeling of Stress : Not on  file  Social Connections:   . Frequency of Communication with Friends and Family: Not on file  . Frequency of Social Gatherings with Friends and Family: Not on file  . Attends Religious Services: Not on file  . Active Member of Clubs or Organizations: Not on file  . Attends Archivist Meetings: Not on file  . Marital Status: Not on file  Intimate Partner Violence:   . Fear of Current or Ex-Partner: Not on file  . Emotionally Abused: Not on file  . Physically Abused: Not on file  . Sexually Abused: Not on file    Family History  Problem Relation Age of Onset  . Arthritis Father   . Cancer Father 38       bladder cancer  . Diabetes Mother      Immunization History  Administered Date(s) Administered  . Fluad Quad(high Dose 65+) 06/17/2019  . Pneumococcal Conjugate-13 06/17/2019  . Tdap  07/14/2013  . Zoster Recombinat (Shingrix) 06/17/2019    Outpatient Encounter Medications as of 07/17/2019  Medication Sig  . ascorbic acid (VITAMIN C) 500 MG tablet Take 500 mg by mouth daily.  Marland Kitchen aspirin EC 81 MG tablet Take 81 mg by mouth daily.  Marland Kitchen atorvastatin (LIPITOR) 40 MG tablet Take 1 tablet (40 mg total) by mouth daily.  Marland Kitchen lisinopril (ZESTRIL) 10 MG tablet Take 1 tablet (10 mg total) by mouth daily.  . metFORMIN (GLUCOPHAGE XR) 500 MG 24 hr tablet Take 1 tablet (500 mg total) by mouth daily with breakfast.  . selenium 50 MCG TABS tablet Take 50 mcg by mouth daily.  . timolol (BETIMOL) 0.25 % ophthalmic solution Place 1 drop into both eyes 2 (two) times daily.  . vitamin E 200 UNIT capsule Take 200 Units by mouth daily.   No facility-administered encounter medications on file as of 07/17/2019.     ROS: Gen: no fever, chills  Skin: no rash, itching ENT: no ear pain, ear drainage, nasal congestion, rhinorrhea, sinus pressure, sore throat Eyes: no blurry vision, double vision Resp: no cough, wheeze,SOB CV: no CP, palpitations, LE edema,  GI: no heartburn, n/v/d/c, abd pain GU: no dysuria, urgency, frequency, hematuria  MSK: no joint pain, myalgias, back pain Neuro: no dizziness, headache, weakness, vertigo Psych: no depression, anxiety, insomnia   No Known Allergies  BP 122/78 (BP Location: Left Arm, Patient Position: Sitting, Cuff Size: Normal)   Pulse 70   Temp (!) 96.1 F (35.6 C) (Temporal)   Ht 5\' 9"  (1.753 m)   Wt 193 lb 6.4 oz (87.7 kg)   SpO2 98%   BMI 28.56 kg/m   BP Readings from Last 3 Encounters:  07/17/19 122/78  07/11/19 (!) 141/56  06/19/19 (!) 155/89    Physical Exam  Constitutional: He is oriented to person, place, and time. He appears well-developed and well-nourished. No distress.  Cardiovascular: Normal rate and regular rhythm.  Pulmonary/Chest: Effort normal. No respiratory distress.  Musculoskeletal:        General: No edema.    Neurological: He is alert and oriented to person, place, and time.  Psychiatric: He has a normal mood and affect. His behavior is normal.     A/P:  1. Controlled type 2 diabetes mellitus without complication, without long-term current use of insulin ([place]) - doing well on metformin XR 500mg  daily - cont ACE, statin, ASA - eye exam scheduled in 07/2019 - Ambulatory referral to diabetic education - Hemoglobin A1c; Future - Microalbumin / creatinine urine ratio; Future -  lab appt in 2 mo  2. Hyperlipidemia associated with type 2 diabetes mellitus ([place]) - Ambulatory referral to diabetic education - cont lipitor 40mg  daily - Lipid panel; Future - will check along w A1C in 2 mo  3. Essential hypertension - much-improved and at goal - cont lisinopril 10mg  daily - low sodium diet and regular CV exercise

## 2019-07-18 ENCOUNTER — Encounter: Payer: Medicare Other | Admitting: Gastroenterology

## 2019-07-21 ENCOUNTER — Ambulatory Visit (HOSPITAL_COMMUNITY)
Admission: RE | Admit: 2019-07-21 | Discharge: 2019-07-21 | Disposition: A | Discharge status: Home / Self Care | Payer: Medicare Other | Source: Ambulatory Visit | Attending: Cardiology | Admitting: Cardiology

## 2019-07-21 ENCOUNTER — Telehealth: Payer: Self-pay | Admitting: Cardiovascular Disease

## 2019-07-21 ENCOUNTER — Other Ambulatory Visit: Payer: Self-pay

## 2019-07-21 ENCOUNTER — Encounter (HOSPITAL_COMMUNITY): Payer: Medicare Other

## 2019-07-21 ENCOUNTER — Ambulatory Visit (HOSPITAL_BASED_OUTPATIENT_CLINIC_OR_DEPARTMENT_OTHER)
Admission: RE | Admit: 2019-07-21 | Discharge: 2019-07-21 | Disposition: A | Discharge status: Home / Self Care | Payer: Medicare Other | Source: Ambulatory Visit | Attending: Cardiology | Admitting: Cardiology

## 2019-07-21 DIAGNOSIS — F17210 Nicotine dependence, cigarettes, uncomplicated: Secondary | ICD-10-CM | POA: Insufficient documentation

## 2019-07-21 DIAGNOSIS — E119 Type 2 diabetes mellitus without complications: Secondary | ICD-10-CM | POA: Diagnosis not present

## 2019-07-21 DIAGNOSIS — E785 Hyperlipidemia, unspecified: Secondary | ICD-10-CM | POA: Insufficient documentation

## 2019-07-21 DIAGNOSIS — I700 Atherosclerosis of aorta: Secondary | ICD-10-CM | POA: Diagnosis not present

## 2019-07-21 DIAGNOSIS — Z136 Encounter for screening for cardiovascular disorders: Secondary | ICD-10-CM | POA: Insufficient documentation

## 2019-07-21 DIAGNOSIS — E1151 Type 2 diabetes mellitus with diabetic peripheral angiopathy without gangrene: Secondary | ICD-10-CM | POA: Insufficient documentation

## 2019-07-21 DIAGNOSIS — I739 Peripheral vascular disease, unspecified: Secondary | ICD-10-CM

## 2019-07-21 DIAGNOSIS — I70203 Unspecified atherosclerosis of native arteries of extremities, bilateral legs: Secondary | ICD-10-CM | POA: Diagnosis not present

## 2019-07-21 DIAGNOSIS — I10 Essential (primary) hypertension: Secondary | ICD-10-CM | POA: Insufficient documentation

## 2019-07-21 NOTE — Telephone Encounter (Signed)
Spoke with pt who states his wife knows Dr. Gwenlyn Found personally and that it would make things easier for him if she was able to be present for OV so that he wouldn't have to return home to repeat information discussed during his appt. Informed pt that exceptions to guest policy related to medical issues, but there is the option for pt wife to be contacted during appt. Pt agreeable with this and provided his wife's name and contact info. Appt note edited to include that pt wife should be contacted during appt long with her contact info. Confirmed appt date, time, and location with pt who verbalized understanding

## 2019-07-21 NOTE — Telephone Encounter (Signed)
Patient is requesting his wife accompany him to his appt 07/23/2019 at 10:30  Please call patient to discuss

## 2019-07-23 ENCOUNTER — Other Ambulatory Visit: Payer: Self-pay

## 2019-07-23 ENCOUNTER — Encounter: Payer: Self-pay | Admitting: Cardiovascular Disease

## 2019-07-23 ENCOUNTER — Ambulatory Visit: Payer: Medicare Other | Admitting: Cardiovascular Disease

## 2019-07-23 DIAGNOSIS — I739 Peripheral vascular disease, unspecified: Secondary | ICD-10-CM | POA: Diagnosis not present

## 2019-07-23 LAB — BASIC METABOLIC PANEL
BUN/Creatinine Ratio: 16 (ref 10–24)
BUN: 13 mg/dL (ref 8–27)
CO2: 23 mmol/L (ref 20–29)
Calcium: 9.8 mg/dL (ref 8.6–10.2)
Chloride: 100 mmol/L (ref 96–106)
Creatinine, Ser: 0.8 mg/dL (ref 0.76–1.27)
GFR calc Af Amer: 106 mL/min/{1.73_m2} (ref >59)
GFR calc non Af Amer: 92 mL/min/{1.73_m2} (ref >59)
Glucose: 122 mg/dL — ABNORMAL HIGH (ref 65–99)
Potassium: 5 mmol/L (ref 3.5–5.2)
Sodium: 138 mmol/L (ref 134–144)

## 2019-07-23 LAB — CBC
Hematocrit: 41 % (ref 37.5–51.0)
Hemoglobin: 13.9 g/dL (ref 13.0–17.7)
MCH: 30.9 pg (ref 26.6–33.0)
MCHC: 33.9 g/dL (ref 31.5–35.7)
MCV: 91 fL (ref 79–97)
Platelets: 249 10*3/uL (ref 150–450)
RBC: 4.5 x10E6/uL (ref 4.14–5.80)
RDW: 12.3 % (ref 11.6–15.4)
WBC: 8.1 10*3/uL (ref 3.4–10.8)

## 2019-07-23 MED ORDER — SODIUM CHLORIDE 0.9% FLUSH: 3.0000 mL | Freq: Two times a day (BID) | INTRAVENOUS | Status: DC

## 2019-07-23 NOTE — Progress Notes (Signed)
07/23/2019 [name]   [birth year]  RS:[id number]  Primary Physician Ronnald Nian, DO Primary Cardiologist: Lorretta Harp MD Garret Reddish, [place], [place]  HPI:  Adam Perkins is a 69 y.o. mild to moderately overweight married Caucasian male father of 2 children, grandfather 2 grandchildren who is retired from [employer].  He is referred by Dr. Davina Poke for evaluation of symptomatic PAD.  He has a 30+ pack year history tobacco abuse smoking 1/2 pack/day now since he was in his mid 16s.  He drinks 1 to 2 glasses of wine a night.  He does have treated hypertension, diabetes and hyperlipidemia.  Is never had a heart attack or stroke.  There is no family history of heart disease.  Denies chest pain or shortness of breath.  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 Ninfa Linden 12/15.  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   Current Meds  Medication Sig  . ascorbic acid (VITAMIN C) 500 MG tablet Take 500 mg by mouth daily.  Marland Kitchen aspirin EC 81 MG tablet Take 81 mg by mouth daily.  Marland Kitchen atorvastatin (LIPITOR) 40 MG tablet Take 1 tablet (40 mg total) by mouth daily.  Marland Kitchen lisinopril (ZESTRIL) 10 MG tablet Take 1 tablet (10 mg total) by mouth daily.  . metFORMIN (GLUCOPHAGE XR) 500 MG 24 hr tablet Take 1 tablet (500 mg total) by mouth daily with breakfast.  . selenium 50 MCG TABS tablet Take 50 mcg by mouth daily.  . timolol (BETIMOL) 0.25 % ophthalmic solution Place 1 drop into both eyes 2 (two) times daily.  . vitamin E 200 UNIT capsule Take 200 Units by mouth daily.     No Known Allergies  Social History   Socioeconomic History  . Marital status: Married    Spouse name: Not on file  . Number of children: 2  . Years of education: Not on file  . Highest education level:  Not on file  Occupational History  . Not on file  Tobacco Use  . Smoking status: Current Every Day Smoker    Packs/day: 1.00    Years: 25.00    Pack years: 25.00    Types: Cigarettes  . Smokeless tobacco: Never Used  . Tobacco comment: quit 2012 after smoking on and off a little for year. Now current smoker  Substance and Sexual Activity  . Alcohol use: Yes    Alcohol/week: 0.0 standard drinks    Comment: 2 glasses of wine daily   . Drug use: No  . Sexual activity: Not Currently  Other Topics Concern  . Not on file  Social History Narrative  . Not on file   Social Determinants of Health   Financial Resource Strain:   . Difficulty of Paying Living Expenses: Not on file  Food Insecurity:   . Worried About Charity fundraiser in the Last Year: Not on file  . Ran Out of Food in the Last Year: Not on file  Transportation Needs:   . Lack of Transportation (Medical): Not on file  . Lack of Transportation (Non-Medical): Not on file  Physical Activity:   . Days of Exercise per Week: Not on file  . Minutes of Exercise per Session: Not on file  Stress:   . Feeling of Stress : Not on file  Social Connections:   . Frequency of Communication with Friends and Family: Not on file  . Frequency of Social Gatherings with Friends and Family: Not on file  . Attends Religious Services: Not on file  . Active Member of Clubs or Organizations: Not on file  . Attends Archivist Meetings: Not on file  . Marital Status: Not on file  Intimate Partner Violence:   . Fear of Current or Ex-Partner: Not on file  . Emotionally Abused: Not on file  . Physically Abused: Not on file  . Sexually Abused: Not on file     Review of Systems: General: negative for chills, fever, night sweats or weight changes.  Cardiovascular: negative for chest pain, dyspnea on exertion, edema, orthopnea, palpitations, paroxysmal nocturnal dyspnea or shortness of breath Dermatological: negative for  rash Respiratory: negative for cough or wheezing Urologic: negative for hematuria Abdominal: negative for nausea, vomiting, diarrhea, bright red blood per rectum, melena, or hematemesis Neurologic: negative for visual changes, syncope, or dizziness All other systems reviewed and are otherwise negative except as noted above.    Blood pressure 137/72, pulse 68, temperature (!) 97 F (36.1 C), height 5\' 9"  (1.753 m), weight 193 lb 9.6 oz (87.8 kg), SpO2 99 %.  General appearance: alert and no distress Neck: no adenopathy, no carotid bruit, no JVD, supple, symmetrical, trachea midline and thyroid not enlarged, symmetric, no tenderness/mass/nodules Lungs: clear to auscultation bilaterally Heart: regular rate and rhythm, S1, S2 normal, no murmur, click, rub or gallop Extremities: extremities normal, atraumatic, no cyanosis or edema Pulses: Diminished right pedal pulse Skin: Skin color, texture, turgor normal. No rashes or lesions Neurologic: Alert and oriented X 3, normal strength and tone. Normal symmetric reflexes. Normal coordination and gait  EKG not performed today  ASSESSMENT AND PLAN:   Peripheral arterial disease [facility]) Adam Perkins was referred to me by Dr. Davina Poke for evaluation of symptomatic PAD.  He does have risk factors including ongoing tobacco abuse, treated hypertension, hyperlipidemia and diabetes.  He did have a right total knee replacement by Dr. Ninfa Linden 12/15.  Over the last 2 years he has had disabling right hip and buttock claudication at 100 feet.  He had recent Doppler studies performed in our office 07/21/2019 revealing a right ABI of 0.74 and a left of 1.12.  He appears to have an occluded right common iliac artery and wishes to proceed with angiography and potential endovascular therapy for lifestyle limiting claudication.      Lorretta Harp MD FACP,FACC,FAHA, [facility] 07/23/2019 11:41 AM

## 2019-07-23 NOTE — Assessment & Plan Note (Signed)
Adam Perkins was referred to me by Dr. Davina Poke for evaluation of symptomatic PAD.  He does have risk factors including ongoing tobacco abuse, treated hypertension, hyperlipidemia and diabetes.  He did have a right total knee replacement by Dr. Ninfa Linden 12/15.  Over the last 2 years he has had disabling right hip and buttock claudication at 100 feet.  He had recent Doppler studies performed in our office 07/21/2019 revealing a right ABI of 0.74 and a left of 1.12.  He appears to have an occluded right common iliac artery and wishes to proceed with angiography and potential endovascular therapy for lifestyle limiting claudication.

## 2019-07-23 NOTE — H&P (View-Only) (Signed)
07/23/2019 Adam Perkins   1951-05-26  RS:[id number]  Primary Physician Adam Nian, DO Primary Cardiologist: Adam Harp MD Adam Perkins, [place], [place]  HPI:  Adam Perkins is a 68 y.o. mild to moderately overweight married Caucasian male father of 2 children, grandfather 2 grandchildren who is retired from [employer].  He is referred by Adam Perkins for evaluation of symptomatic PAD.  He has a 30+ pack year history tobacco abuse smoking 1/2 pack/day now since he was in his mid 39s.  He drinks 1 to 2 glasses of wine a night.  He does have treated hypertension, diabetes and hyperlipidemia.  Is never had a heart attack or stroke.  There is no family history of heart disease.  Denies chest pain or shortness of breath.  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 Ninfa Perkins 12/15.  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   Current Meds  Medication Sig  . ascorbic acid (VITAMIN C) 500 MG tablet Take 500 mg by mouth daily.  Marland Kitchen aspirin EC 81 MG tablet Take 81 mg by mouth daily.  Marland Kitchen atorvastatin (LIPITOR) 40 MG tablet Take 1 tablet (40 mg total) by mouth daily.  Marland Kitchen lisinopril (ZESTRIL) 10 MG tablet Take 1 tablet (10 mg total) by mouth daily.  . metFORMIN (GLUCOPHAGE XR) 500 MG 24 hr tablet Take 1 tablet (500 mg total) by mouth daily with breakfast.  . selenium 50 MCG TABS tablet Take 50 mcg by mouth daily.  . timolol (BETIMOL) 0.25 % ophthalmic solution Place 1 drop into both eyes 2 (two) times daily.  . vitamin E 200 UNIT capsule Take 200 Units by mouth daily.     No Known Allergies  Social History   Socioeconomic History  . Marital status: Married    Spouse name: Not on file  . Number of children: 2  . Years of education: Not on file  . Highest education level:  Not on file  Occupational History  . Not on file  Tobacco Use  . Smoking status: Current Every Day Smoker    Packs/day: 1.00    Years: 25.00    Pack years: 25.00    Types: Cigarettes  . Smokeless tobacco: Never Used  . Tobacco comment: quit 2012 after smoking on and off a little for year. Now current smoker  Substance and Sexual Activity  . Alcohol use: Yes    Alcohol/week: 0.0 standard drinks    Comment: 2 glasses of wine daily   . Drug use: No  . Sexual activity: Not Currently  Other Topics Concern  . Not on file  Social History Narrative  . Not on file   Social Determinants of Health   Financial Resource Strain:   . Difficulty of Paying Living Expenses: Not on file  Food Insecurity:   . Worried About Charity fundraiser in the Last Year: Not on file  . Ran Out of Food in the Last Year: Not on file  Transportation Needs:   . Lack of Transportation (Medical): Not on file  . Lack of Transportation (Non-Medical): Not on file  Physical Activity:   . Days of Exercise per Week: Not on file  . Minutes of Exercise per Session: Not on file  Stress:   . Feeling of Stress : Not on file  Social Connections:   . Frequency of Communication with Friends and Family: Not on file  . Frequency of Social Gatherings with Friends and Family: Not on file  . Attends Religious Services: Not on file  . Active Member of Clubs or Organizations: Not on file  . Attends Archivist Meetings: Not on file  . Marital Status: Not on file  Intimate Partner Violence:   . Fear of Current or Ex-Partner: Not on file  . Emotionally Abused: Not on file  . Physically Abused: Not on file  . Sexually Abused: Not on file     Review of Systems: General: negative for chills, fever, night sweats or weight changes.  Cardiovascular: negative for chest pain, dyspnea on exertion, edema, orthopnea, palpitations, paroxysmal nocturnal dyspnea or shortness of breath Dermatological: negative for  rash Respiratory: negative for cough or wheezing Urologic: negative for hematuria Abdominal: negative for nausea, vomiting, diarrhea, bright red blood per rectum, melena, or hematemesis Neurologic: negative for visual changes, syncope, or dizziness All other systems reviewed and are otherwise negative except as noted above.    Blood pressure 137/72, pulse 68, temperature (!) 97 F (36.1 C), height 5\' 9"  (1.753 m), weight 193 lb 9.6 oz (87.8 kg), SpO2 99 %.  General appearance: alert and no distress Neck: no adenopathy, no carotid bruit, no JVD, supple, symmetrical, trachea midline and thyroid not enlarged, symmetric, no tenderness/mass/nodules Lungs: clear to auscultation bilaterally Heart: regular rate and rhythm, S1, S2 normal, no murmur, click, rub or gallop Extremities: extremities normal, atraumatic, no cyanosis or edema Pulses: Diminished right pedal pulse Skin: Skin color, texture, turgor normal. No rashes or lesions Neurologic: Alert and oriented X 3, normal strength and tone. Normal symmetric reflexes. Normal coordination and gait  EKG not performed today  ASSESSMENT AND PLAN:   Peripheral arterial disease [facility]) Adam Perkins was referred to me by Adam Perkins for evaluation of symptomatic PAD.  He does have risk factors including ongoing tobacco abuse, treated hypertension, hyperlipidemia and diabetes.  He did have a right total knee replacement by Dr. Ninfa Perkins 12/15.  Over the last 2 years he has had disabling right hip and buttock claudication at 100 feet.  He had recent Doppler studies performed in our office 07/21/2019 revealing a right ABI of 0.74 and a left of 1.12.  He appears to have an occluded right common iliac artery and wishes to proceed with angiography and potential endovascular therapy for lifestyle limiting claudication.      Adam Harp MD FACP,FACC,FAHA, [facility] 07/23/2019 11:41 AM

## 2019-07-23 NOTE — Patient Instructions (Signed)
Medication Instructions:  Your physician recommends that you continue on your current medications as directed. Please refer to the Current Medication list given to you today.  If you need a refill on your cardiac medications before your next appointment, please call your pharmacy.   Lab work: BMET, CBC If you have labs (blood work) drawn today and your tests are completely normal, you will receive your results only by: [place] (if you have MyChart) OR A paper copy in the mail If you have any lab test that is abnormal or we need to change your treatment, we will call you to review the results.  Testing/Procedures: Your physician has requested that you have a peripheral vascular angiogram on 08/11/2019 at 5:30am. This exam is performed at the hospital. During this exam IV contrast is used to look at arterial blood flow. Please review the information sheet given for details.  AND  ONE WEEK FOLLOWING PROCEDURE ON 08/11/2019 YOU WILL HAVE  Your physician has requested that you have a lower extremity arterial exercise duplex. During this test, exercise and ultrasound are used to evaluate arterial blood flow in the legs. Allow one hour for this exam. There are no restrictions or special instructions.  AND  Your physician has requested that you have an ankle brachial index (ABI). During this test an ultrasound and blood pressure cuff are used to evaluate the arteries that supply the arms and legs with blood. Allow thirty minutes for this exam. There are no restrictions or special instructions.  Follow-Up: At [facility], you and your health needs are our priority.  As part of our continuing mission to provide you with exceptional heart care, we have created designated Provider Care Teams.  These Care Teams include your primary Cardiologist (physician) and Advanced Practice Providers (APPs -  Physician Assistants and Nurse Practitioners) who all work together to provide you with the care you  need, when you need it. You may see Dr. Gwenlyn Found or one of the following Advanced Practice Providers on your designated Care Team:    Kerin Ransom, PA-C  [place], [place]  Coletta Memos,   Your physician wants you to follow-up in: 2 weeks after your procedure on 08/11/2019  Any Other Special Instructions Will Be Listed Below (If Applicable).  You are scheduled for a Cardiac Catheterization on Monday, February 22 with Dr. Quay Burow.  1. Please arrive at the [facility] (Main Entrance A) at [facility]: [address] [place], [place] [address] at 5:30 AM (This time is two hours before your procedure to ensure your preparation). Free valet parking service is available.   Special note: Every effort is made to have your procedure done on time. Please understand that emergencies sometimes delay scheduled procedures.  2. Diet: Do not eat solid foods after midnight.  The patient may have clear liquids until 5am upon the day of the procedure.   3. Labs: You will need to have blood drawn today  4. Medication instructions in preparation for your procedure:   Contrast Allergy: No  Hold Metformin 24 hours prior to your procedure and 48 hours after.  On the morning of your procedure, take your Aspirin and any morning medicines NOT listed above.  You may use sips of water.  5. Plan for one night stay--bring personal belongings. 6. Bring a current list of your medications and current insurance cards. 7. You MUST have a responsible person to drive you home. 8. Someone MUST be with you the first 24  hours after you arrive home or your discharge will be delayed. 9. Please wear clothes that are easy to get on and off and wear slip-on shoes.   You will be tested for Covid following your procedure. This will be scheduled on 08/08/2019 at 12:00pm. This is a Drive Up Visit at the [facility] [address] [place]. Someone will direct you to the appropriate testing  line. Stay in your car and someone will be with you shortly.  Thank you for allowing [place] to care for you!   -- [facility]

## 2019-07-30 ENCOUNTER — Other Ambulatory Visit (HOSPITAL_COMMUNITY): Payer: Self-pay | Admitting: Ophthalmology

## 2019-07-30 DIAGNOSIS — H40053 Ocular hypertension, bilateral: Secondary | ICD-10-CM | POA: Diagnosis not present

## 2019-07-30 DIAGNOSIS — H2513 Age-related nuclear cataract, bilateral: Secondary | ICD-10-CM | POA: Diagnosis not present

## 2019-07-30 DIAGNOSIS — E119 Type 2 diabetes mellitus without complications: Secondary | ICD-10-CM | POA: Diagnosis not present

## 2019-07-30 MED FILL — TIMOLOL 0.5% EYE DROPS: 0.5 | 75 days supply | Qty: 10 | Fill #0

## 2019-07-31 ENCOUNTER — Other Ambulatory Visit: Payer: Self-pay

## 2019-07-31 ENCOUNTER — Encounter: Payer: Self-pay | Admitting: Registered"

## 2019-07-31 ENCOUNTER — Encounter: Payer: Medicare Other | Attending: Family Medicine | Admitting: Registered"

## 2019-07-31 DIAGNOSIS — E119 Type 2 diabetes mellitus without complications: Secondary | ICD-10-CM | POA: Diagnosis not present

## 2019-07-31 NOTE — Patient Instructions (Signed)
Goals:  Follow Diabetes Meal Plan as instructed  Eat 3 meals and 2 snacks, every 3-5 hrs  Limit carbohydrate intake to 45-60 grams carbohydrate/meal  Limit carbohydrate intake to 15-30 grams carbohydrate/snack  Add lean protein foods to meals/snacks  Monitor glucose levels as instructed by your doctor  Aim for 30 mins of physical activity daily  Bring food record and glucose log to your next nutrition visit 

## 2019-07-31 NOTE — Progress Notes (Signed)
Diabetes Self-Management Education  Visit Type:  First/Initial  Appt. Start Time: 10:10 Appt. End Time: 11:25  07/31/2019  [name], identified by name and date of birth, is a 69 y.o. male with a diagnosis of Diabetes: Type 2.   ASSESSMENT  Pt states he has not been able to exercise due to right leg needing angioplasty at the end of Feb. Pt states he wants to tackle diabetes aggressively and bring cholesterol level down. States he loves to cook and entertain. Wants to know what are no-no's, ok sometimes, and what he should be focusing on. Recent labs reveal elevated cholesterol (306) and elevated Trg (215). States he should be around 180 and currently weighing 193 lbs.   States eating regimen has changed since being diagnosed with diabetes in Dec 2020. States he tries to stay away from breads.  Tries to do a lot of grilling. Eats beef, pork, chicken, fish; tries to create balance. States he has been trying to drink 6-8 glasses of water a day.   Reports he was a member of [employer] and was going to 3 days/week; was exercising 5 days/week with strength training. States he has not been doing a lot of physical activity recent health concerns.   There were no vitals taken for this visit. There is no height or weight on file to calculate BMI.   Diabetes Self-Management Education - 07/31/19 1018      Health Coping   How would you rate your overall health?  Good      Psychosocial Assessment   Patient Belief/Attitude about Diabetes  Motivated to manage diabetes   disappointed   Self-care barriers  None    Self-management support  Family;Doctor's office    Patient Concerns  Nutrition/Meal planning    Special Needs  None    Preferred Learning Style  No preference indicated    Learning Readiness  Change in progress      Complications   Last HgB A1C per patient/outside source  7 %    How often do you check your blood sugar?  0 times/day (not testing)    Number of hypoglycemic  episodes per month  0    Number of hyperglycemic episodes per week  0    Have you had a dilated eye exam in the past 12 months?  Yes    Have you had a dental exam in the past 12 months?  Yes    Are you checking your feet?  Yes    How many days per week are you checking your feet?  1      Dietary Intake   Breakfast  cheerios + skim milk + or oatmeal + black coffee    Lunch  salad + tuna or cottage cheese    Dinner  chicken soup (chicken, vegetables) + crackers    Snack (evening)  cashews    Beverage(s)  coffee, water (4 glasses), wine (2 glasses)      Exercise   Exercise Type  Moderate (swimming / aerobic walking)    How many days per week to you exercise?  4    How many minutes per day do you exercise?  50    Total minutes per week of exercise  200      Patient Education   Previous Diabetes Education  No    Disease state   Definition of diabetes, type 1 and 2, and the diagnosis of diabetes;Factors that contribute to the development of diabetes  Nutrition management   Role of diet in the treatment of diabetes and the relationship between the three main macronutrients and blood glucose level;Carbohydrate counting;Reviewed blood glucose goals for pre and post meals and how to evaluate the patients' food intake on their blood glucose level.;Effects of alcohol on blood glucose and safety factors with consumption of alcohol.    Physical activity and exercise   Role of exercise on diabetes management, blood pressure control and cardiac health.    Medications  Reviewed patients medication for diabetes, action, purpose, timing of dose and side effects.    Monitoring  Interpreting lab values - A1C, lipid, urine microalbumina.;Identified appropriate SMBG and/or A1C goals.;Daily foot exams;Yearly dilated eye exam    Acute complications  Taught treatment of hypoglycemia - the 15 rule.;Discussed and identified patients' treatment of hyperglycemia.    Chronic complications  Relationship between  chronic complications and blood glucose control;Assessed and discussed foot care and prevention of foot problems;Lipid levels, blood glucose control and heart disease;Identified and discussed with patient  current chronic complications;Retinopathy and reason for yearly dilated eye exams;Nephropathy, what it is, prevention of, the use of ACE, ARB's and early detection of through urine microalbumia.;Reviewed with patient heart disease, higher risk of, and prevention    Psychosocial adjustment  Role of stress on diabetes;Travel strategies;Identified and addressed patients feelings and concerns about diabetes      Individualized Goals (developed by patient)   Nutrition  Follow meal plan discussed    Physical Activity  Exercise 3-5 times per week;30 minutes per day    Medications  take my medication as prescribed    Reducing Risk  do foot checks daily;treat hypoglycemia with 15 grams of carbs if blood glucose less than 70mg /dL    Health Coping  Not Applicable      Post-Education Assessment   Patient understands the diabetes disease and treatment process.  Demonstrates understanding / competency    Patient understands incorporating nutritional management into lifestyle.  Demonstrates understanding / competency    Patient undertands incorporating physical activity into lifestyle.  Demonstrates understanding / competency    Patient understands using medications safely.  Demonstrates understanding / competency    Patient understands monitoring blood glucose, interpreting and using results  Demonstrates understanding / competency    Patient understands prevention, detection, and treatment of acute complications.  Demonstrates understanding / competency    Patient understands prevention, detection, and treatment of chronic complications.  Demonstrates understanding / competency    Patient understands how to develop strategies to address psychosocial issues.  Demonstrates understanding / competency    Patient  understands how to develop strategies to promote health/change behavior.  Demonstrates understanding / competency      Outcomes   Program Status  Completed       Learning Objective:  Patient will have a greater understanding of diabetes self-management. Patient education plan is to attend individual and/or group sessions per assessed needs and concerns.   Plan:   Patient Instructions  Goals:  Follow Diabetes Meal Plan as instructed  Eat 3 meals and 2 snacks, every 3-5 hrs  Limit carbohydrate intake to 45-60 grams carbohydrate/meal  Limit carbohydrate intake to 15-30 grams carbohydrate/snack  Add lean protein foods to meals/snacks  Monitor glucose levels as instructed by your doctor  Aim for 30 mins of physical activity daily  Bring food record and glucose log to your next nutrition visit     Expected Outcomes:  Demonstrated interest in learning. Expect positive outcomes  Education material provided: ADA -  How to Thrive: A Guide for Your Journey with Diabetes  If problems or questions, patient to contact team via:  Phone and Email  Future DSME appointment: - PRN

## 2019-08-07 ENCOUNTER — Telehealth: Payer: Self-pay | Admitting: *Deleted

## 2019-08-07 NOTE — Telephone Encounter (Signed)
Pt contacted pre-catheterization scheduled at [facility] for: Monday August 11, 2019 7:30 AM Verified arrival time and place: [facility]) at: 5:30 AM   No solid food after midnight prior to cath, clear liquids until 5 AM day of procedure. Contrast allergy:no  Hold: Metformin-day of procedure and 48 hours post procedure.  Except hold medications AM meds can be  taken pre-cath with sip of water including: ASA 81 mg   Confirmed patient has responsible adult to drive home post procedure and observe 24 hours after arriving home: yes  Currently, due to Covid-19 pandemic, only one person will be allowed with patient. Must be the same person for patient's entire stay and will be required to wear a mask. They will be asked to wait in the waiting room for the duration of the patient's stay.  Patients are required to wear a mask when they enter the hospital.      COVID-19 Pre-Screening Questions:  . In the past 7 to 10 days have you had a cough,  shortness of breath, headache, congestion, fever (100 or greater) body aches, chills, sore throat, or sudden loss of taste or sense of smell? no . Have you been around anyone with known Covid 19 in the past 7-10 days? no . Have you been around anyone who is awaiting Covid 19 test results in the past 7 to 10 days? no . Have you been around anyone who has been exposed to Covid 19, or has mentioned symptoms of Covid 19 within the past 7 to 10 days? no  I reviewed procedure/mask/visitor instructions, COVID-19 screening questions with patient, he verbalized understanding, thanked me for call.

## 2019-08-08 ENCOUNTER — Other Ambulatory Visit (HOSPITAL_COMMUNITY)
Admission: RE | Admit: 2019-08-08 | Discharge: 2019-08-08 | Disposition: A | Discharge status: Home / Self Care | Payer: Medicare Other | Source: Ambulatory Visit | Attending: Cardiovascular Disease | Admitting: Cardiovascular Disease

## 2019-08-08 DIAGNOSIS — Z01812 Encounter for preprocedural laboratory examination: Secondary | ICD-10-CM | POA: Diagnosis not present

## 2019-08-08 DIAGNOSIS — Z20822 Contact with and (suspected) exposure to covid-19: Secondary | ICD-10-CM | POA: Insufficient documentation

## 2019-08-08 LAB — SARS CORONAVIRUS 2 (TAT 6-24 HRS): SARS Coronavirus 2: NEGATIVE

## 2019-08-11 ENCOUNTER — Ambulatory Visit (HOSPITAL_COMMUNITY)
Admission: RE | Admit: 2019-08-11 | Discharge: 2019-08-12 | Disposition: A | Discharge status: Home / Self Care | Payer: Medicare Other | Source: Ambulatory Visit | Attending: Cardiovascular Disease | Admitting: Cardiovascular Disease

## 2019-08-11 ENCOUNTER — Encounter (HOSPITAL_COMMUNITY)
Admission: RE | Disposition: A | Discharge status: Home / Self Care | Payer: Self-pay | Source: Ambulatory Visit | Attending: Cardiovascular Disease

## 2019-08-11 ENCOUNTER — Other Ambulatory Visit: Payer: Self-pay

## 2019-08-11 DIAGNOSIS — Z7984 Long term (current) use of oral hypoglycemic drugs: Secondary | ICD-10-CM | POA: Diagnosis not present

## 2019-08-11 DIAGNOSIS — I739 Peripheral vascular disease, unspecified: Secondary | ICD-10-CM | POA: Diagnosis present

## 2019-08-11 DIAGNOSIS — Z96651 Presence of right artificial knee joint: Secondary | ICD-10-CM | POA: Diagnosis not present

## 2019-08-11 DIAGNOSIS — E1151 Type 2 diabetes mellitus with diabetic peripheral angiopathy without gangrene: Secondary | ICD-10-CM | POA: Insufficient documentation

## 2019-08-11 DIAGNOSIS — F17210 Nicotine dependence, cigarettes, uncomplicated: Secondary | ICD-10-CM | POA: Diagnosis not present

## 2019-08-11 DIAGNOSIS — Z79899 Other long term (current) drug therapy: Secondary | ICD-10-CM | POA: Diagnosis not present

## 2019-08-11 DIAGNOSIS — I10 Essential (primary) hypertension: Secondary | ICD-10-CM | POA: Diagnosis not present

## 2019-08-11 DIAGNOSIS — Z7982 Long term (current) use of aspirin: Secondary | ICD-10-CM | POA: Insufficient documentation

## 2019-08-11 DIAGNOSIS — I70211 Atherosclerosis of native arteries of extremities with intermittent claudication, right leg: Secondary | ICD-10-CM | POA: Diagnosis not present

## 2019-08-11 DIAGNOSIS — E663 Overweight: Secondary | ICD-10-CM | POA: Insufficient documentation

## 2019-08-11 DIAGNOSIS — Z6827 Body mass index (BMI) 27.0-27.9, adult: Secondary | ICD-10-CM | POA: Insufficient documentation

## 2019-08-11 DIAGNOSIS — E785 Hyperlipidemia, unspecified: Secondary | ICD-10-CM | POA: Diagnosis not present

## 2019-08-11 HISTORY — PX: PERIPHERAL VASCULAR INTERVENTION: CATH118257

## 2019-08-11 HISTORY — PX: ABDOMINAL AORTOGRAM W/LOWER EXTREMITY: CATH118223

## 2019-08-11 LAB — GLUCOSE, CAPILLARY
Glucose-Capillary: 131 mg/dL — ABNORMAL HIGH (ref 70–99)
Glucose-Capillary: 107 mg/dL — ABNORMAL HIGH (ref 70–99)
Glucose-Capillary: 107 mg/dL — ABNORMAL HIGH (ref 70–99)

## 2019-08-11 LAB — POCT ACTIVATED CLOTTING TIME
Activated Clotting Time: 279 seconds
Activated Clotting Time: 274 seconds
Activated Clotting Time: 268 seconds
Activated Clotting Time: 235 seconds
Activated Clotting Time: 164 seconds
Activated Clotting Time: 181 seconds

## 2019-08-11 SURGERY — ABDOMINAL AORTOGRAM W/LOWER EXTREMITY
Anesthesia: LOCAL | Laterality: Right

## 2019-08-11 MED ORDER — ASPIRIN EC 81 MG PO TBEC
81.0000 mg | DELAYED_RELEASE_TABLET | Freq: Every day | ORAL | Status: DC
  Administered 2019-08-12: 81 mg via ORAL
  Filled 2019-08-11: qty 1

## 2019-08-11 MED ORDER — LISINOPRIL 10 MG PO TABS
10.0000 mg | ORAL_TABLET | Freq: Every day | ORAL | Status: DC
  Administered 2019-08-12: 10 mg via ORAL
  Filled 2019-08-11: qty 1

## 2019-08-11 MED ORDER — SODIUM CHLORIDE 0.9 % IV SOLN: 250.0000 mL | INTRAVENOUS | Status: DC | PRN

## 2019-08-11 MED ORDER — TIMOLOL HEMIHYDRATE 0.25 % OP SOLN: 1.0000 [drp] | Freq: Two times a day (BID) | OPHTHALMIC | Status: DC

## 2019-08-11 MED ORDER — HEPARIN (PORCINE) IN NACL 1000-0.9 UT/500ML-% IV SOLN
INTRAVENOUS | Status: DC | PRN
  Administered 2019-08-11 (×3): 500 mL

## 2019-08-11 MED ORDER — SODIUM CHLORIDE 0.9 % WEIGHT BASED INFUSION: 1.0000 mL/kg/h | INTRAVENOUS | Status: DC

## 2019-08-11 MED ORDER — LIDOCAINE HCL (PF) 1 % IJ SOLN
INTRAMUSCULAR | Status: AC
  Filled 2019-08-11: qty 30

## 2019-08-11 MED ORDER — HEPARIN (PORCINE) IN NACL 1000-0.9 UT/500ML-% IV SOLN
INTRAVENOUS | Status: AC
  Filled 2019-08-11: qty 1000

## 2019-08-11 MED ORDER — TIMOLOL MALEATE 0.25 % OP SOLN
1.0000 [drp] | Freq: Two times a day (BID) | OPHTHALMIC | Status: DC
  Filled 2019-08-11: qty 5

## 2019-08-11 MED ORDER — SODIUM CHLORIDE 0.9 % WEIGHT BASED INFUSION
3.0000 mL/kg/h | INTRAVENOUS | Status: DC
  Administered 2019-08-11: 3 mL/kg/h via INTRAVENOUS

## 2019-08-11 MED ORDER — ASPIRIN EC 81 MG PO TBEC: 81.0000 mg | DELAYED_RELEASE_TABLET | Freq: Every day | ORAL | Status: DC

## 2019-08-11 MED ORDER — ONDANSETRON HCL 4 MG/2ML IJ SOLN: 4.0000 mg | Freq: Four times a day (QID) | INTRAMUSCULAR | Status: DC | PRN

## 2019-08-11 MED ORDER — ATORVASTATIN CALCIUM 40 MG PO TABS
40.0000 mg | ORAL_TABLET | Freq: Every day | ORAL | Status: DC
  Administered 2019-08-12: 40 mg via ORAL
  Filled 2019-08-11: qty 1

## 2019-08-11 MED ORDER — LABETALOL HCL 5 MG/ML IV SOLN: 10.0000 mg | INTRAVENOUS | Status: DC | PRN

## 2019-08-11 MED ORDER — HEPARIN SODIUM (PORCINE) 1000 UNIT/ML IJ SOLN
INTRAMUSCULAR | Status: DC | PRN
  Administered 2019-08-11: 2500 [IU] via INTRAVENOUS
  Administered 2019-08-11: 9000 [IU] via INTRAVENOUS
  Administered 2019-08-11: 2500 [IU] via INTRAVENOUS

## 2019-08-11 MED ORDER — ZOLPIDEM TARTRATE 5 MG PO TABS
5.0000 mg | ORAL_TABLET | Freq: Every evening | ORAL | Status: DC | PRN
  Administered 2019-08-11: 5 mg via ORAL
  Filled 2019-08-11: qty 1

## 2019-08-11 MED ORDER — IODIXANOL 320 MG/ML IV SOLN
INTRAVENOUS | Status: DC | PRN
  Administered 2019-08-11: 250 mL via INTRA_ARTERIAL

## 2019-08-11 MED ORDER — LIDOCAINE HCL (PF) 1 % IJ SOLN
INTRAMUSCULAR | Status: DC | PRN
  Administered 2019-08-11 (×2): 15 mL via INTRADERMAL

## 2019-08-11 MED ORDER — SODIUM CHLORIDE 0.9% FLUSH
3.0000 mL | Freq: Two times a day (BID) | INTRAVENOUS | Status: DC
  Administered 2019-08-12: 3 mL via INTRAVENOUS

## 2019-08-11 MED ORDER — ACETAMINOPHEN 325 MG PO TABS
650.0000 mg | ORAL_TABLET | ORAL | Status: DC | PRN
  Administered 2019-08-11: 650 mg via ORAL
  Filled 2019-08-11: qty 2

## 2019-08-11 MED ORDER — CLOPIDOGREL BISULFATE 300 MG PO TABS
ORAL_TABLET | ORAL | Status: DC | PRN
  Administered 2019-08-11: 300 mg via ORAL

## 2019-08-11 MED ORDER — FENTANYL CITRATE (PF) 100 MCG/2ML IJ SOLN
INTRAMUSCULAR | Status: DC | PRN
  Administered 2019-08-11 (×2): 25 ug via INTRAVENOUS

## 2019-08-11 MED ORDER — SODIUM CHLORIDE 0.9 % IV SOLN: INTRAVENOUS | Status: AC

## 2019-08-11 MED ORDER — HEPARIN SODIUM (PORCINE) 1000 UNIT/ML IJ SOLN
INTRAMUSCULAR | Status: AC
  Filled 2019-08-11: qty 1

## 2019-08-11 MED ORDER — MIDAZOLAM HCL 2 MG/2ML IJ SOLN
INTRAMUSCULAR | Status: DC | PRN
  Administered 2019-08-11 (×2): 1 mg via INTRAVENOUS

## 2019-08-11 MED ORDER — MIDAZOLAM HCL 2 MG/2ML IJ SOLN
INTRAMUSCULAR | Status: AC
  Filled 2019-08-11: qty 2

## 2019-08-11 MED ORDER — SODIUM CHLORIDE 0.9% FLUSH: 3.0000 mL | INTRAVENOUS | Status: DC | PRN

## 2019-08-11 MED ORDER — CLOPIDOGREL BISULFATE 75 MG PO TABS
75.0000 mg | ORAL_TABLET | Freq: Every day | ORAL | Status: DC
  Administered 2019-08-12: 75 mg via ORAL
  Filled 2019-08-11: qty 1

## 2019-08-11 MED ORDER — CLOPIDOGREL BISULFATE 300 MG PO TABS
ORAL_TABLET | ORAL | Status: AC
  Filled 2019-08-11: qty 1

## 2019-08-11 MED ORDER — ASPIRIN 81 MG PO CHEW: 81.0000 mg | CHEWABLE_TABLET | ORAL | Status: DC

## 2019-08-11 MED ORDER — FENTANYL CITRATE (PF) 100 MCG/2ML IJ SOLN
INTRAMUSCULAR | Status: AC
  Filled 2019-08-11: qty 2

## 2019-08-11 MED ORDER — HYDRALAZINE HCL 20 MG/ML IJ SOLN: 5.0000 mg | INTRAMUSCULAR | Status: DC | PRN

## 2019-08-11 MED ORDER — MORPHINE SULFATE (PF) 2 MG/ML IV SOLN: 2.0000 mg | INTRAVENOUS | Status: DC | PRN

## 2019-08-11 SURGICAL SUPPLY — 35 items
BALLN MUSTANG 4.0X40 75 (BALLOONS) ×3
BALLN MUSTANG 8X20X75 (BALLOONS) ×3
BALLN STERLI SL OTW 2.5X80X150 (BALLOONS) ×3
BALLOON MUSTANG 4.0X40 75 (BALLOONS) ×2 IMPLANT
BALLOON MUSTANG 8X20X75 (BALLOONS) ×2 IMPLANT
BALLOON STRLNG OTW 2.5X80X150 (BALLOONS) ×2 IMPLANT
CATH ANGIO 5F PIGTAIL 65CM (CATHETERS) ×3 IMPLANT
CATH BEACON 5 .035 65 RIM TIP (CATHETERS) ×3 IMPLANT
CATH CXI 2.6F 65 ANG (CATHETERS) ×1
CATH NAVICROSS ANG 65CM (CATHETERS) ×2 IMPLANT
CATH NAVICROSS ST 65CM (CATHETERS) ×2 IMPLANT
CATH SOFTOUCH MOTARJEME 5F (CATHETERS) ×3 IMPLANT
CATH SPRT ANG 65X2.3FR PLATN (CATHETERS) ×2 IMPLANT
CATHETER NAVICROSS ANG 65CM (CATHETERS) ×3
CATHETER NAVICROSS ST 65CM (CATHETERS) ×3
CLOSURE MYNX CONTROL 5F (Vascular Products) ×3 IMPLANT
DEVICE TORQUE .014-.018 (MISCELLANEOUS) ×2 IMPLANT
GLIDEWIRE ANGLED SS 035X260CM (WIRE) ×3 IMPLANT
GUIDEWIRE ZILIENT 12G 018 (WIRE) ×3 IMPLANT
KIT ENCORE 26 ADVANTAGE (KITS) ×3 IMPLANT
KIT MICROPUNCTURE NIT STIFF (SHEATH) ×3 IMPLANT
KIT PV (KITS) ×3 IMPLANT
SHEATH BRITE TIP 7FR 35CM (SHEATH) ×3 IMPLANT
SHEATH PINNACLE 5F 10CM (SHEATH) ×3 IMPLANT
SHEATH PINNACLE 7F 10CM (SHEATH) ×3 IMPLANT
SHEATH PROBE COVER 6X72 (BAG) ×3 IMPLANT
STENT VIABAHN 7X29X80 VBX (Permanent Stent) ×3 IMPLANT
STENT VIABAHN 7X39X80 VBX (Permanent Stent) ×3 IMPLANT
STOPCOCK MORSE 400PSI 3WAY (MISCELLANEOUS) ×3 IMPLANT
SYR MEDRAD MARK 7 150ML (SYRINGE) ×3 IMPLANT
TORQUE DEVICE .014-.018 (MISCELLANEOUS) ×3
TRANSDUCER W/STOPCOCK (MISCELLANEOUS) ×3 IMPLANT
TRAY PV CATH (CUSTOM PROCEDURE TRAY) ×3 IMPLANT
TUBING CIL FLEX 10 FLL-RA (TUBING) ×3 IMPLANT
WIRE HITORQ VERSACORE ST 145CM (WIRE) ×6 IMPLANT

## 2019-08-11 NOTE — Interval H&P Note (Signed)
History and Physical Interval Note:  08/11/2019 7:47 AM  Adam Perkins  has presented today for surgery, with the diagnosis of pad.  The various methods of treatment have been discussed with the patient and family. After consideration of risks, benefits and other options for treatment, the patient has consented to  Procedure(s): ABDOMINAL AORTOGRAM W/LOWER EXTREMITY (N/A) as a surgical intervention.  The patient's history has been reviewed, patient examined, no change in status, stable for surgery.  I have reviewed the patient's chart and labs.  Questions were answered to the patient's satisfaction.     Quay Burow

## 2019-08-11 NOTE — Progress Notes (Addendum)
Site area: rt groin fa sheath pulled by Johnette Abraham Site Prior to Removal:  Level 1 hematoma Pressure Applied For: 40 minutes Manual:   yes Patient Status During Pull:  Became slightly sweaty; SBP down to low 100's; 200cc 0.9NS fluid bolus given. Feels fine now. VSS Post Pull Site:  Level 1, hematoma softened, no bruising Post Pull Instructions Given:  yes Post Pull Pulses Present: rt pt palpable Dressing Applied:  Gauze and tegaderm Bedrest begins @ 1350 Comments:

## 2019-08-11 NOTE — Plan of Care (Signed)

## 2019-08-11 NOTE — Progress Notes (Signed)
Small hematoma bilaterally; mostly resolved on left leg w/manual pressure x5 minutes. Rt groin hematoma softened, oozing at site.

## 2019-08-12 DIAGNOSIS — Z95828 Presence of other vascular implants and grafts: Secondary | ICD-10-CM | POA: Diagnosis not present

## 2019-08-12 DIAGNOSIS — I739 Peripheral vascular disease, unspecified: Secondary | ICD-10-CM | POA: Diagnosis not present

## 2019-08-12 DIAGNOSIS — Z96651 Presence of right artificial knee joint: Secondary | ICD-10-CM | POA: Diagnosis not present

## 2019-08-12 DIAGNOSIS — Z7982 Long term (current) use of aspirin: Secondary | ICD-10-CM | POA: Diagnosis not present

## 2019-08-12 DIAGNOSIS — E1151 Type 2 diabetes mellitus with diabetic peripheral angiopathy without gangrene: Secondary | ICD-10-CM | POA: Diagnosis not present

## 2019-08-12 DIAGNOSIS — I10 Essential (primary) hypertension: Secondary | ICD-10-CM | POA: Diagnosis not present

## 2019-08-12 DIAGNOSIS — E785 Hyperlipidemia, unspecified: Secondary | ICD-10-CM | POA: Diagnosis not present

## 2019-08-12 DIAGNOSIS — I70211 Atherosclerosis of native arteries of extremities with intermittent claudication, right leg: Secondary | ICD-10-CM | POA: Diagnosis not present

## 2019-08-12 DIAGNOSIS — Z79899 Other long term (current) drug therapy: Secondary | ICD-10-CM | POA: Diagnosis not present

## 2019-08-12 DIAGNOSIS — Z7984 Long term (current) use of oral hypoglycemic drugs: Secondary | ICD-10-CM | POA: Diagnosis not present

## 2019-08-12 LAB — CBC
HCT: 36.7 % — ABNORMAL LOW (ref 39.0–52.0)
Hemoglobin: 12 g/dL — ABNORMAL LOW (ref 13.0–17.0)
MCH: 31.3 pg (ref 26.0–34.0)
MCHC: 32.7 g/dL (ref 30.0–36.0)
MCV: 95.6 fL (ref 80.0–100.0)
Platelets: 202 10*3/uL (ref 150–400)
RBC: 3.84 MIL/uL — ABNORMAL LOW (ref 4.22–5.81)
RDW: 12.6 % (ref 11.5–15.5)
WBC: 7.9 10*3/uL (ref 4.0–10.5)
nRBC: 0 % (ref 0.0–0.2)

## 2019-08-12 LAB — BASIC METABOLIC PANEL
Anion gap: 8 (ref 5–15)
BUN: 10 mg/dL (ref 8–23)
CO2: 24 mmol/L (ref 22–32)
Calcium: 8.7 mg/dL — ABNORMAL LOW (ref 8.9–10.3)
Chloride: 107 mmol/L (ref 98–111)
Creatinine, Ser: 0.78 mg/dL (ref 0.61–1.24)
GFR calc Af Amer: >60 mL/min (ref >60)
GFR calc non Af Amer: >60 mL/min (ref >60)
Glucose, Bld: 125 mg/dL — ABNORMAL HIGH (ref 70–99)
Potassium: 3.7 mmol/L (ref 3.5–5.1)
Sodium: 139 mmol/L (ref 135–145)

## 2019-08-12 MED ORDER — CLOPIDOGREL BISULFATE 75 MG PO TABS: 75.0000 mg | ORAL_TABLET | Freq: Every day | ORAL | 11 refills | Status: DC

## 2019-08-12 MED FILL — Lidocaine HCl Local Preservative Free (PF) Inj 1%: INTRAMUSCULAR | Qty: 30 | Status: AC

## 2019-08-12 MED FILL — CLOPIDOGREL 75 MG TABLET: 75 | 30 days supply | Qty: 30 | Fill #0

## 2019-08-12 NOTE — Discharge Summary (Signed)
Discharge Summary    Patient ID: FAIN PINN MRN: [id number]; DOB: 14-Dec-1950  Admit date: 08/11/2019 Discharge date: 08/12/2019  Primary Care Provider: Ronnald Nian, DO  Primary Cardiologist: Evalina Field, MD   Discharge Diagnoses    Active Problems:   Peripheral arterial disease ([place])   Claudication in peripheral vascular disease ([place])   HTN   DM   Tobacco smoking    Diagnostic Studies/Procedures    ABDOMINAL AORTOGRAM W/LOWER EXTREMITY  PERIPHERAL VASCULAR INTERVENTION   Angiographic Data:   1: Abdominal aortogram-renal arteries were patent.  The infrarenal abdominal aorta was mildly atherosclerotic 2: Left lower extremity-30 to 40% ostial and proximal left common iliac artery stenosis, occluded left anterior tibial (two-vessel runoff) 3: Right lower extremity-relatively long segment CTO beginning at the origin of the right common iliac artery and extending Down to above the takeoff of the hypogastric with three-vessel runoff  Final Impression: Successful right common iliac artery CTO intervention using overlapping VBX stents postdilated with an 8 mm x 2 cm balloon resulting in reduction of a total occlusion to 0% residual.  The patient did receive 300 mg of p.o. Plavix.  The right common femoral sheath will be removed and pressure held on 60 PhosLo 170.  The left common femoral was closed hemostatically using a Mynx closure device.  The patient will be hydrated overnight, discharged home in the morning on dual antiplatelet therapy.  We will obtain lower extremity arterial Doppler studies in our [facility] office next week and I will see him back 2 to 3 weeks thereafter.  History of Present Illness     Adam Perkins is a 69 y.o. male with  a hx of diabetes, hypertension, hyperlipidemia, going tobacco smoking and PAD presents for peripheral vascular intervention.   He has a 30+ pack year history tobacco abuse smoking 1/2 pack/day now since he was in his mid 57s.  He  drinks 1 to 2 glasses of wine a night.    He has  disabling right hip and buttock claudication for the last 2 years with recent Doppler studies performed in our office 07/21/2019 revealing a right ABI of 0.74 and a left ABI of 1.12 with what appears to be an occluded right common iliac artery  and wishes to proceed with angiography and potential endovascular therapy for lifestyle limiting claudication.  Hospital Course     Consultants: None  The patient underwent successful right common iliac artery CTO intervention using overlapping VBX stents postdilated with an 8 mm x 2 cm balloon resulting in reduction of a total occlusion to 0% residual.  Postprocedure patient had hematoma which resolved with pressure.  No overnight complication.  Ambulated well.  Renal function stable.  Blood pressure minimally elevated however reports normal at home.  Advised to keep log.  Discussed importance of dual antiplatelet therapy.  Recommended smoking cessation.  Hold Metformin 48 hours post intervention.  Patient was seen by Dr. Ellyn Hack and felt stable for discharge with outpatient lower extremity arterial Doppler and follow-up with Dr. Alvester Chou.  Did the patient have an acute coronary syndrome (MI, NSTEMI, STEMI, etc) this admission?:  No                               Did the patient have a percutaneous coronary intervention (stent / angioplasty)?:  Yes.   Peripheral vascular intervention>> will be on aspirin and Plavix.   Discharge Vitals Blood pressure (!) 144/61,  pulse 71, temperature 97.9 F (36.6 C), temperature source Oral, resp. rate (!) 22, height 5\' 9"  (1.753 m), weight 85.8 kg, SpO2 97 %.  Filed Weights   08/11/19 0545 08/12/19 0543  Weight: 86.2 kg 85.8 kg   Physical Exam  Constitutional: He is oriented to person, place, and time and well-developed, well-nourished, and in no distress.  HENT:  Head: Normocephalic and atraumatic.  Eyes: Pupils are equal, round, and reactive to light. EOM are normal.    Cardiovascular: Normal rate and regular rhythm.  Mild right groin  ecchymosis without hematoma  Pulmonary/Chest: Effort normal and breath sounds normal.  Abdominal: Soft. Bowel sounds are normal.  Musculoskeletal:        General: Normal range of motion.     Cervical back: Normal range of motion and neck supple.  Neurological: He is alert and oriented to person, place, and time.  Skin: Skin is warm and dry.  Psychiatric: Affect normal.    Labs & Radiologic Studies    CBC Recent Labs    08/12/19 0236  WBC 7.9  HGB 12.0*  HCT 36.7*  MCV 95.6  PLT [id number]   Basic Metabolic Panel Recent Labs    08/12/19 0236  NA 139  K 3.7  CL 107  CO2 24  GLUCOSE 125*  BUN 10  CREATININE 0.78  CALCIUM 8.7*  _____________  PERIPHERAL VASCULAR CATHETERIZATION  Result Date: 08/11/2019  [id number] LOCATION:  FACILITY: [facility] PHYSICIAN: Quay Burow, M.D. 03-Oct-1950 DATE OF PROCEDURE:  08/11/2019 DATE OF DISCHARGE: PV Angiogram/Intervention History obtained from chart review.Adam Perkins is a 69 y.o. mild to moderately overweight married Caucasian male father of 2 children, grandfather 2 grandchildren who is retired from [employer].  He is referred by Dr. Davina Poke for evaluation of symptomatic PAD.  He has a 30+ pack year history tobacco abuse smoking 1/2 pack/day now since he was in his mid 88s.  He drinks 1 to 2 glasses of wine a night.  He does have treated hypertension, diabetes and hyperlipidemia.  Is never had a heart attack or stroke.  There is no family history of heart disease.  Denies chest pain or shortness of breath.  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 Ninfa Linden 12/15.  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  Adam Perkins presents today for angiography potential  endovascular therapy for right lower extremity lifestyle limiting claudication. Pre Procedure Diagnosis: Peripheral arterial disease Post Procedure Diagnosis: Peripheral arterial disease Operators: Dr. Quay Burow Procedures Performed:  1.  Ultrasound-guided right and left common femoral artery access  2.  Abdominal aortogram/bilateral iliac angiogram/bifemoral runoff  3.  VBX covered stenting right common iliac artery CTO using overlapping VBX stents (7 x 39, 7 x 29)  4.  Left common femoral artery angiogram, MYNX closure left common femoral artery PROCEDURE DESCRIPTION: The patient was brought to the second floor [facility] lab in the the postabsorptive state. He was premedicated with IV Versed and fentanyl. His right and left groins were prepped and shaved in usual sterile fashion. Xylocaine 1% was used for local anesthesia. A 5 French sheath was inserted into the left common femoral artery using standard Seldinger technique.  Ultrasound guidance was used to identify the left common femoral artery and  obtained access.  A digital picture of the ultrasound pictures were captured and placed in the patient's chart.  A 5 French pigtail catheters placed in the mid abdominal aorta.  Abdominal aortography, bilateral iliac angiography with bifemoral runoff was performed using bolus chase, digital subtraction and step table technique.  Isovue dye was used for the entirety of the case.  Retrograde aortic pressures monitored during the case.  Angiographic Data: 1: Abdominal aortogram-renal arteries were patent.  The infrarenal abdominal aorta was mildly atherosclerotic 2: Left lower extremity-30 to 40% ostial and proximal left common iliac artery stenosis, occluded left anterior tibial (two-vessel runoff) 3: Right lower extremity-relatively long segment CTO beginning at the origin of the right common iliac artery and extending Down to above the takeoff of the hypogastric with three-vessel runoff   Adam Perkins  has an occluded right common iliac artery this probably responsible for his hip buttock and leg pain.  We will proceed with attempt at endovascular therapy for lifestyle limiting claudication. Procedure Description: I obtained ultrasound access of the right common femoral artery under direct ultrasound guidance.  A digital picture was captured and placed in the patient's chart.  A 7 French Brite tip sheath was then placed in the right common femoral artery.  The patient received a total of 14 calciums of heparin with an ending ACT of 268.  Total contrast administered the patient was 250 cc. I attempted to cross the distal cap with a 035 Nava cross endhole.  The catheter along with a stiff angled 035 Glidewire.  I was able to cross the distal capital was unable to access the proximal cap and aorta.  I also attempted unsuccessfully to access the proximal cap using a "RIM" catheter.  I then exchanged for a 018 angled CXI endhole catheter along with a 12 g Zilient CTO wire I was able to cross the proximal cap and entered the aorta.  I used a 2.5 mm x 80 mm long Sterling balloon to dilate the CTO.  I exchanged for a Navicross  and demonstrated that I was intraluminal and captured a aortic pressure waveform.  I then exchanged for a an 035 versa core wire and placed a 7 mm x 39 mm long VBX just short of the ostium.  I then used a 4 mm x 4 cm balloon to again post dilate the ostium and in an overlapping fashion laced a 7 mm x 29 mm VBX.  I postdilated the entire stented segment with an 8 mm x 2 cm Mustang balloon at nominal pressures resulting in reduction with total occlusion to 0% residual beginning at the origin and extending down to the hypogastric.  The patient tolerated procedure well.  He had at most 30 to 40% left common iliac artery stenosis but was asymptomatic on that side and therefore intervention was not performed.  The right hip sheath was exchanged for a short 7 [place] sheath.  A left common femoral  angiogram was performed and a MYNX closure device was then successfully deployed. Final Impression: Successful right common iliac artery CTO intervention using overlapping VBX stents postdilated with an 8 mm x 2 cm balloon resulting in reduction of a total occlusion to 0% residual.  The patient did receive 300 mg of p.o. Plavix.  The right common femoral sheath will be removed and pressure held on 60 PhosLo 170.  The left common femoral was closed hemostatically using a Mynx closure device.  The patient will be hydrated overnight, discharged home in the  morning on dual antiplatelet therapy.  We will obtain lower extremity arterial Doppler studies in our [facility] office next week and I will see him back 2 to 3 weeks thereafter. Quay Burow. MD, [facility] 08/11/2019 10:09 AM   VAS US AORTA MEDICARE SCREEN  Result Date: 07/21/2019 ABDOMINAL AORTA STUDY Indications: AAA screening; Patient has had bilateral hip pain RT> LT for about              6 years. He did have a right hip replacement 5 years ago and has              still noticed pain on the right side. He denies rest pain. He will              walk about 100 yards before having to stop and rest due to the              right hip/buttock pain. Risk Factors: Hypertension, hyperlipidemia, Diabetes, current smoker. Other Factors: Current ABI Right .74 Left 1.12.                 SEE ABI AND BILATERAL LEG ARTERIAL DOPPLER REPORTS. Limitations: Air/bowel gas.  Comparison Study: None Performing Technologist: Alecia Mackin RVT, RDCS (AE), RDMS  Examination Guidelines: A complete evaluation includes B-mode imaging, spectral Doppler, color Doppler, and power Doppler as needed of all accessible portions of each vessel. Bilateral testing is considered an integral part of a complete examination. Limited examinations for reoccurring indications may be performed as noted.  Abdominal Aorta Findings: +-------------+-------+----------+----------+----------+--------------+--------+  Location     AP (cm)Trans (cm)PSV (cm/s)Waveform  Thrombus      Comments +-------------+-------+----------+----------+----------+--------------+--------+ Proximal     1.90   1.90      61        monophasic                       +-------------+-------+----------+----------+----------+--------------+--------+ Mid          1.60   1.70      77        monophasic                       +-------------+-------+----------+----------+----------+--------------+--------+ Distal       1.60   1.90      53        monophasic                       +-------------+-------+----------+----------+----------+--------------+--------+ RT CIA Prox  0.8    0.9                           Limited view                                                             of the ostium                                                            due to  calcification,                                                           ? occlusion vs                                                           stenosis.              +-------------+-------+----------+----------+----------+--------------+--------+ RT CIA Mid                    190       monophasic<50% stenosis          +-------------+-------+----------+----------+----------+--------------+--------+ RT CIA Distal                 95        monophasic                       +-------------+-------+----------+----------+----------+--------------+--------+ RT EIA Prox  1.0    0.9       101       monophasic                       +-------------+-------+----------+----------+----------+--------------+--------+ RT EIA Mid                    52        monophasic                       +-------------+-------+----------+----------+----------+--------------+--------+ RT EIA Distal                 39        monophasic                        +-------------+-------+----------+----------+----------+--------------+--------+ LT CIA Prox  0.9    1.0       243       biphasic  > 50% stenosis         +-------------+-------+----------+----------+----------+--------------+--------+ LT CIA Mid                    169       biphasic                         +-------------+-------+----------+----------+----------+--------------+--------+ LT CIA Distal                 177       biphasic                         +-------------+-------+----------+----------+----------+--------------+--------+ LT EIA Prox  1.1    1.1       202       biphasic  > 50% stenosis         +-------------+-------+----------+----------+----------+--------------+--------+ LT EIA Mid                    131       biphasic                         +-------------+-------+----------+----------+----------+--------------+--------+  LT EIA Distal                 113       biphasic                         +-------------+-------+----------+----------+----------+--------------+--------+ IVC/Iliac Findings: +--------+------+--------+--------+   IVC   PatentThrombusComments +--------+------+--------+--------+ IVC Proxpatent                 +--------+------+--------+--------+    Summary: Abdominal Aorta: The largest aortic measurement is 1.9 cm. Stenosis: +-------------------+-------------------------+ Location           Stenosis                  +-------------------+-------------------------+ Right Common Iliac occluded proximal segment +-------------------+-------------------------+ Left Common Iliac  >50% stenosis             +-------------------+-------------------------+ Left External Iliac>50% stenosis             +-------------------+-------------------------+ Significant calcified walls seen in aorta and bilateral iliac arteries. There is a question of right CIA ostium occlusion vs. stenosis. <50% stenosis mid right CIA, >50% stenosis in  left proximal CIA and proximal left EIA. IVC/Iliac: There is no evidence of thrombus involving the IVC.  *See table(s) above for measurements and observations. Patient scheduled to see Dr. Gwenlyn Found on 07/23/2019 at 10:30 am. Vascular consult recommended.  Electronically signed by Larae Grooms MD on 07/21/2019 at 5:06:57 PM.    Final    VAS [place] LE ART SEG MULTI (Segm&LE Reynauds)  Result Date: 07/21/2019 LOWER EXTREMITY DOPPLER STUDY Indications: Claudication, and Patient has had bilateral hip pain RT> LT for              about 6 years. He did have a right hip replacement 5 years ago and              has still noticed pain on the right side. He denies rest pain. He              will walk about 100 yards before having to stop and rest due to the              right hip/buttock pain. High Risk Factors: Hypertension, hyperlipidemia, Diabetes, current smoker. Other Factors: SEE AORTOILIAC AND BILATERAL LEG ARTERIAL DOPPLER REPORTS.  Comparison Study: None Performing Technologist: Alecia Mackin RVT, RDCS (AE), RDMS  Examination Guidelines: A complete evaluation includes at minimum, Doppler waveform signals and systolic blood pressure reading at the level of bilateral brachial, anterior tibial, and posterior tibial arteries, when vessel segments are accessible. Bilateral testing is considered an integral part of a complete examination. Photoelectric Plethysmograph (PPG) waveforms and toe systolic pressure readings are included as required and additional duplex testing as needed. Limited examinations for reoccurring indications may be performed as noted.  ABI Findings: +---------+------------------+-----+----------+--------+ Right    Rt Pressure (mmHg)IndexWaveform  Comment  +---------+------------------+-----+----------+--------+ Brachial 143                                       +---------+------------------+-----+----------+--------+ CFA                             monophasic          +---------+------------------+-----+----------+--------+ Popliteal  monophasic         +---------+------------------+-----+----------+--------+ ATA      95                0.66 monophasic         +---------+------------------+-----+----------+--------+ PTA      106               0.74 monophasic         +---------+------------------+-----+----------+--------+ PERO     96                0.67 monophasic         +---------+------------------+-----+----------+--------+ Great Toe80                0.56 Abnormal           +---------+------------------+-----+----------+--------+ +---------+------------------+-----+----------------+-------+ Left     Lt Pressure (mmHg)IndexWaveform        Comment +---------+------------------+-----+----------------+-------+ Brachial 138                                            +---------+------------------+-----+----------------+-------+ CFA                             triphasic               +---------+------------------+-----+----------------+-------+ Popliteal                       barely triphasic        +---------+------------------+-----+----------------+-------+ ATA      146               1.02 biphasic                +---------+------------------+-----+----------------+-------+ PTA      160               1.12 biphasic                +---------+------------------+-----+----------------+-------+ PERO     142               0.99 biphasic                +---------+------------------+-----+----------------+-------+ Great Toe88                0.62 Abnormal                +---------+------------------+-----+----------------+-------+ +-------+-----------+-----------+------------+------------+ ABI/TBIToday's ABIToday's TBIPrevious ABIPrevious TBI +-------+-----------+-----------+------------+------------+ Right  .74        .56                                  +-------+-----------+-----------+------------+------------+ Left   1.12       .62                                 +-------+-----------+-----------+------------+------------+  Summary: Right: Resting right ankle-brachial index indicates moderate right lower extremity arterial disease. The right toe-brachial index is abnormal. Left: Resting left ankle-brachial index is within normal range. No evidence of significant left lower extremity arterial disease. The left toe-brachial index is abnormal.  *See table(s) above for measurements and observations.  Electronically signed by Larae Grooms MD on 07/21/2019 at 5:09:51 PM.    Final    VAS [place] LOWER  EXTREMITY ARTERIAL DUPLEX  Result Date: 07/21/2019 LOWER EXTREMITY ARTERIAL DUPLEX STUDY Indications: Claudication, and Patient has had bilateral hip pain RT> LT for              about 6 years. He did have a right hip replacement 5 years ago and              has still noticed pain on the right side. He denies rest pain. He              will walk about 100 yards before having to stop and rest due to the              right hip/buttock pain. High Risk Factors: Hypertension, hyperlipidemia, Diabetes, current smoker. Other Factors: SEE AORTOILIAC AND ABI REPORTS.  Current ABI: Right .74 Left 1.12 Comparison Study: None Performing Technologist: Alecia Mackin RVT, RDCS (AE), RDMS  Examination Guidelines: A complete evaluation includes B-mode imaging, spectral Doppler, color Doppler, and power Doppler as needed of all accessible portions of each vessel. Bilateral testing is considered an integral part of a complete examination. Limited examinations for reoccurring indications may be performed as noted. Aorta: +--------+-------+----------+----------+----------+--------+-----+         AP (cm)Trans (cm)PSV (cm/s)Waveform  ThrombusShape +--------+-------+----------+----------+----------+--------+-----+ Proximal1.9    1.90      61        monophasic               +--------+-------+----------+----------+----------+--------+-----+ Mid     1.60   1.70      77        monophasic              +--------+-------+----------+----------+----------+--------+-----+ Distal  1.60   1.90      53        monophasic              +--------+-------+----------+----------+----------+--------+-----+   +-----------+--------+-----+--------+-------------------+----------------------+ RIGHT      PSV cm/sRatioStenosisWaveform           Comments               +-----------+--------+-----+--------+-------------------+----------------------+ CIA Prox                                           ? ostium occlusion vs                                                     stenosis. Calcified                                                       walls.                 +-----------+--------+-----+--------+-------------------+----------------------+ CIA Mid    190                  monophasic         <50 % stenosis         +-----------+--------+-----+--------+-------------------+----------------------+ CIA Distal 95                   monophasic                                +-----------+--------+-----+--------+-------------------+----------------------+  EIA Prox   101                  monophasic                                +-----------+--------+-----+--------+-------------------+----------------------+ EIA Mid    52                   monophasic                                +-----------+--------+-----+--------+-------------------+----------------------+ EIA Distal 39                   monophasic                                +-----------+--------+-----+--------+-------------------+----------------------+ CFA Prox   46                   monophasic                                +-----------+--------+-----+--------+-------------------+----------------------+ CFA Distal 47                   monophasic         irregular  calcified                                                       plaque                 +-----------+--------+-----+--------+-------------------+----------------------+ DFA        60                   monophasic                                +-----------+--------+-----+--------+-------------------+----------------------+ SFA Prox   60                   monophasic                                +-----------+--------+-----+--------+-------------------+----------------------+ SFA Mid    61                   monophasic                                +-----------+--------+-----+--------+-------------------+----------------------+ SFA Distal 53                   monophasic         irregular calcified                                                       plaque seen            +-----------+--------+-----+--------+-------------------+----------------------+  POP Prox   36                   monophasic                                +-----------+--------+-----+--------+-------------------+----------------------+ POP Distal 37                   monophasic                                +-----------+--------+-----+--------+-------------------+----------------------+ TP Trunk   49                   monophasic                                +-----------+--------+-----+--------+-------------------+----------------------+ ATA Prox   43                   monophasic                                +-----------+--------+-----+--------+-------------------+----------------------+ ATA Mid    54                   monophasic                                +-----------+--------+-----+--------+-------------------+----------------------+ ATA Distal 48                   monophasic                                +-----------+--------+-----+--------+-------------------+----------------------+ PTA Prox   31                   monophasic                                 +-----------+--------+-----+--------+-------------------+----------------------+ PTA Mid    45                   monophasic                                +-----------+--------+-----+--------+-------------------+----------------------+ PTA Distal 58                   monophasic                                +-----------+--------+-----+--------+-------------------+----------------------+ PERO Prox  13                   dampened monophasic                       +-----------+--------+-----+--------+-------------------+----------------------+ PERO Mid   18                   monophasic                                +-----------+--------+-----+--------+-------------------+----------------------+  PERO Distal22                   monophasic                                +-----------+--------+-----+--------+-------------------+----------------------+  +-----------+--------+-----+--------+---------------+--------------------------+ LEFT       PSV cm/sRatioStenosisWaveform       Comments                   +-----------+--------+-----+--------+---------------+--------------------------+ CIA Prox   243                  biphasic       >50 %                      +-----------+--------+-----+--------+---------------+--------------------------+ CIA Mid    169                  biphasic                                  +-----------+--------+-----+--------+---------------+--------------------------+ CIA Distal 177                  biphasic                                  +-----------+--------+-----+--------+---------------+--------------------------+ EIA Prox   202                  biphasic       >50 %                      +-----------+--------+-----+--------+---------------+--------------------------+ EIA Mid    131                  biphasic                                   +-----------+--------+-----+--------+---------------+--------------------------+ EIA Distal 113                  biphasic                                  +-----------+--------+-----+--------+---------------+--------------------------+ CFA Prox   98                   triphasic      irregular calcified plaque +-----------+--------+-----+--------+---------------+--------------------------+ CFA Distal 81                   triphasic      calcified walls            +-----------+--------+-----+--------+---------------+--------------------------+ DFA        92                   biphasic                                  +-----------+--------+-----+--------+---------------+--------------------------+ SFA Prox   83                   biphasic                                  +-----------+--------+-----+--------+---------------+--------------------------+  SFA Mid    88                   biphasic       heterogenous smooth plaque +-----------+--------+-----+--------+---------------+--------------------------+ SFA Distal 97                   biphasic       irregular calcified plaque +-----------+--------+-----+--------+---------------+--------------------------+ POP Prox   62                   biphasic                                  +-----------+--------+-----+--------+---------------+--------------------------+ POP Distal 51                   barely                                                                    triphasic                                 +-----------+--------+-----+--------+---------------+--------------------------+ TP Trunk   72                   biphasic                                  +-----------+--------+-----+--------+---------------+--------------------------+ ATA Prox   40                   biphasic                                  +-----------+--------+-----+--------+---------------+--------------------------+ ATA  Mid    55                   biphasic                                  +-----------+--------+-----+--------+---------------+--------------------------+ ATA Distal 41                   barely biphasic                           +-----------+--------+-----+--------+---------------+--------------------------+ PTA Prox   39                   biphasic                                  +-----------+--------+-----+--------+---------------+--------------------------+ PTA Mid    50                   biphasic                                  +-----------+--------+-----+--------+---------------+--------------------------+ PTA Distal 43  biphasic                                  +-----------+--------+-----+--------+---------------+--------------------------+ PERO Prox  29                   biphasic                                  +-----------+--------+-----+--------+---------------+--------------------------+ PERO Mid   35                   monophasic                                +-----------+--------+-----+--------+---------------+--------------------------+ PERO Distal54                   biphasic                                  +-----------+--------+-----+--------+---------------+--------------------------+  Summary: Right: There is significant arterial wall plaque seen throughout aorta, iliac arteries and lower leg arteries. Possible right CIA ostium occlusion vs. stenosis. < 50 % stenosis in left mid CIA. Left: There is significant arterial wall plaque seen throughout aorta, iliac arteries and lower leg arteries. >50 % stenosis in left CIA and EIA.  See table(s) above for measurements and observations. Patient scheduled to see Dr. Gwenlyn Found on 07/23/2019 at 10:30 am. Vascular consult recommended. Electronically signed by Larae Grooms MD on 07/21/2019 at 5:12:40 PM.    Final    Disposition   Pt is being discharged home today in good  condition.  Follow-up Plans & Appointments    Follow-up Information    [facility]. Go on 08/25/2019.   Specialty: Cardiology Why: @10am  for LE arterial doppler Contact information: [address] [place] [phone]         Discharge Instructions    Diet - low sodium heart healthy   Complete by: As directed    Discharge instructions   Complete by: As directed    No driving for 48 hours. No lifting over 5 lbs for 1 week. No sexual activity for 1 week.  Keep procedure site clean & dry. If you notice increased pain, swelling, bleeding or pus, call/return!  You may shower, but no soaking baths/hot tubs/pools for 1 week.   Hold metformin for 2 days. Resume 08/14/19.   Increase activity slowly   Complete by: As directed       Discharge Medications   Allergies as of 08/12/2019   No Known Allergies     Medication List    TAKE these medications   ascorbic acid 500 MG tablet Commonly known as: VITAMIN C Take 500 mg by mouth daily.   aspirin EC 81 MG tablet Take 81 mg by mouth daily.   atorvastatin 40 MG tablet Commonly known as: LIPITOR Take 1 tablet (40 mg total) by mouth daily.   clopidogrel 75 MG tablet Commonly known as: PLAVIX Take 1 tablet (75 mg total) by mouth daily with breakfast.   lisinopril 10 MG tablet Commonly known as: ZESTRIL Take 1 tablet (10 mg total) by mouth daily.   metFORMIN 500 MG 24 hr tablet Commonly known as: Glucophage XR Take 1 tablet (500 mg  total) by mouth daily with breakfast.   selenium 50 MCG Tabs tablet Take 50 mcg by mouth daily.   timolol 0.25 % ophthalmic solution Commonly known as: BETIMOL Place 1 drop into both eyes 2 (two) times daily.   vitamin E 200 UNIT capsule Take 200 Units by mouth daily.          Outstanding Labs/Studies   LE arterial doppler   Duration of Discharge Encounter   Greater than 30 minutes including physician time.  Jarrett Soho, PA 08/12/2019, 8:07 AM

## 2019-08-15 ENCOUNTER — Ambulatory Visit: Payer: Medicare Other | Attending: Internal Medicine

## 2019-08-15 DIAGNOSIS — Z23 Encounter for immunization: Secondary | ICD-10-CM | POA: Insufficient documentation

## 2019-08-15 NOTE — Progress Notes (Signed)
   Covid-19 Vaccination Clinic  Name:  Adam Perkins    MRN: [id number] DOB: 08-24-1950  08/15/2019  [name] was observed post Covid-19 immunization for 15 minutes without incidence. He was provided with Vaccine Information Sheet and instruction to access the V-Safe system.   [name] was instructed to call 911 with any severe reactions post vaccine: Marland Kitchen Difficulty breathing  . Swelling of your face and throat  . A fast heartbeat  . A bad rash all over your body  . Dizziness and weakness    Immunizations Administered    Name Date Dose VIS Date Route   Pfizer COVID-19 Vaccine 08/15/2019 10:25 AM 0.3 mL 05/30/2019 Intramuscular   Manufacturer: [place]   Lot: [id number]   [place]: [id number]

## 2019-08-19 ENCOUNTER — Other Ambulatory Visit (HOSPITAL_COMMUNITY): Payer: Self-pay | Admitting: Cardiovascular Disease

## 2019-08-19 DIAGNOSIS — I739 Peripheral vascular disease, unspecified: Secondary | ICD-10-CM

## 2019-08-19 DIAGNOSIS — Z95828 Presence of other vascular implants and grafts: Secondary | ICD-10-CM

## 2019-08-25 ENCOUNTER — Ambulatory Visit (HOSPITAL_COMMUNITY)
Admission: RE | Admit: 2019-08-25 | Discharge: 2019-08-25 | Disposition: A | Discharge status: Home / Self Care | Payer: Medicare Other | Source: Ambulatory Visit | Attending: Cardiology | Admitting: Cardiology

## 2019-08-25 ENCOUNTER — Other Ambulatory Visit: Payer: Self-pay

## 2019-08-25 ENCOUNTER — Ambulatory Visit (HOSPITAL_BASED_OUTPATIENT_CLINIC_OR_DEPARTMENT_OTHER)
Admission: RE | Admit: 2019-08-25 | Discharge: 2019-08-25 | Disposition: A | Discharge status: Home / Self Care | Payer: Medicare Other | Source: Ambulatory Visit | Attending: Cardiology | Admitting: Cardiology

## 2019-08-25 ENCOUNTER — Other Ambulatory Visit (HOSPITAL_COMMUNITY): Payer: Self-pay | Admitting: Cardiovascular Disease

## 2019-08-25 DIAGNOSIS — I739 Peripheral vascular disease, unspecified: Secondary | ICD-10-CM

## 2019-08-25 DIAGNOSIS — Z95828 Presence of other vascular implants and grafts: Secondary | ICD-10-CM

## 2019-08-26 ENCOUNTER — Telehealth: Payer: Self-pay

## 2019-08-26 DIAGNOSIS — I739 Peripheral vascular disease, unspecified: Secondary | ICD-10-CM

## 2019-08-26 NOTE — Telephone Encounter (Signed)
Spoke to patient recent doppler results given.Advised to repeat in 1 year.

## 2019-08-27 ENCOUNTER — Ambulatory Visit: Payer: Medicare Other | Admitting: Cardiovascular Disease

## 2019-08-27 ENCOUNTER — Encounter: Payer: Self-pay | Admitting: Cardiovascular Disease

## 2019-08-27 ENCOUNTER — Other Ambulatory Visit: Payer: Self-pay

## 2019-08-27 VITALS — BP 128/62 | HR 68 | Temp 96.1°F | Ht 69.0 in | Wt 185.0 lb

## 2019-08-27 DIAGNOSIS — I700 Atherosclerosis of aorta: Secondary | ICD-10-CM | POA: Diagnosis not present

## 2019-08-27 DIAGNOSIS — I739 Peripheral vascular disease, unspecified: Secondary | ICD-10-CM

## 2019-08-27 DIAGNOSIS — Z95828 Presence of other vascular implants and grafts: Secondary | ICD-10-CM

## 2019-08-27 NOTE — Progress Notes (Signed)
08/27/2019 [name]   10/28/1950  [id number]  Primary Physician Ronnald Nian, DO Primary Cardiologist: Lorretta Harp MD Garret [place], [place], [place]  HPI:  Adam Perkins is a 69 y.o.  mild to moderately overweight married Caucasian male father of 2 children, grandfather 2 grandchildren who is retired from [employer].  He is referred by Dr. Davina Poke for evaluation of symptomatic PAD.  I last saw him in the office 07/23/2019. He has a 30+ pack year history tobacco abuse smoking 1/2 pack/day now since he was in his mid 46s.  He drinks 1 to 2 glasses of wine a night.  He does have treated hypertension, diabetes and hyperlipidemia.  Is never had a heart attack or stroke.  There is no family history of heart disease.  Denies chest pain or shortness of breath.  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 Ninfa Linden 12/15.  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  I performed peripheral angiography, PTA and covered stenting of an occluded right common iliac artery on 08/11/2019.  His follow-up Doppler studies performed 08/25/2019 revealed normal ABIs and velocities.  His claudication has resolved.  He is on aspirin Plavix.   Current Meds  Medication Sig  . aspirin EC 81 MG tablet Take 81 mg by mouth daily.  Marland Kitchen atorvastatin (LIPITOR) 40 MG tablet Take 1 tablet (40 mg total) by mouth daily.  . clopidogrel (PLAVIX) 75 MG tablet Take 1 tablet (75 mg total) by mouth daily with breakfast.  . lisinopril (ZESTRIL) 10 MG tablet Take 1 tablet (10 mg total) by mouth daily.  . metFORMIN (GLUCOPHAGE XR) 500 MG 24 hr tablet Take 1 tablet (500 mg total) by mouth daily with breakfast.  . selenium 50 MCG TABS tablet Take 50 mcg by mouth daily.  . timolol (BETIMOL) 0.25 % ophthalmic solution  Place 1 drop into both eyes daily.   . vitamin E 200 UNIT capsule Take 200 Units by mouth daily.  . [DISCONTINUED] ascorbic acid (VITAMIN C) 500 MG tablet Take 500 mg by mouth daily.   Current Facility-Administered Medications for the 08/27/19 encounter (Office Visit) with Lorretta Harp, MD  Medication  . sodium chloride flush (NS) 0.9 % injection 3 mL     No Known Allergies  Social History   Socioeconomic History  . Marital status: Married    Spouse name: Not on file  . Number of children: 2  . Years of education: Not on file  . Highest education level: Not on file  Occupational History  . Not on file  Tobacco Use  . Smoking status: Current Every Day Smoker    Packs/day: 1.00    Years: 25.00    Pack years: 25.00    Types: Cigarettes  . Smokeless tobacco: Never Used  . Tobacco comment: quit 2012 after smoking on and off a little for year. Now current smoker  Substance and Sexual Activity  . Alcohol use: Yes    Alcohol/week: 0.0 standard drinks    Comment: 2 glasses of wine daily   . Drug use: No  . Sexual activity: Not Currently  Other Topics Concern  . Not on file  Social History Narrative  . Not on file   Social Determinants  of Health   Financial Resource Strain:   . Difficulty of Paying Living Expenses: Not on file  Food Insecurity:   . Worried About Charity fundraiser in the Last Year: Not on file  . Ran Out of Food in the Last Year: Not on file  Transportation Needs:   . Lack of Transportation (Medical): Not on file  . Lack of Transportation (Non-Medical): Not on file  Physical Activity:   . Days of Exercise per Week: Not on file  . Minutes of Exercise per Session: Not on file  Stress:   . Feeling of Stress : Not on file  Social Connections:   . Frequency of Communication with Friends and Family: Not on file  . Frequency of Social Gatherings with Friends and Family: Not on file  . Attends Religious Services: Not on file  . Active Member of Clubs or  Organizations: Not on file  . Attends Archivist Meetings: Not on file  . Marital Status: Not on file  Intimate Partner Violence:   . Fear of Current or Ex-Partner: Not on file  . Emotionally Abused: Not on file  . Physically Abused: Not on file  . Sexually Abused: Not on file     Review of Systems: General: negative for chills, fever, night sweats or weight changes.  Cardiovascular: negative for chest pain, dyspnea on exertion, edema, orthopnea, palpitations, paroxysmal nocturnal dyspnea or shortness of breath Dermatological: negative for rash Respiratory: negative for cough or wheezing Urologic: negative for hematuria Abdominal: negative for nausea, vomiting, diarrhea, bright red blood per rectum, melena, or hematemesis Neurologic: negative for visual changes, syncope, or dizziness All other systems reviewed and are otherwise negative except as noted above.    Blood pressure 128/62, pulse 68, temperature (!) 96.1 F (35.6 C), height 5\' 9"  (1.753 m), weight 185 lb (83.9 kg).  General appearance: alert and no distress Neck: no adenopathy, no carotid bruit, no JVD, supple, symmetrical, trachea midline and thyroid not enlarged, symmetric, no tenderness/mass/nodules Lungs: clear to auscultation bilaterally Heart: regular rate and rhythm, S1, S2 normal, no murmur, click, rub or gallop Extremities: extremities normal, atraumatic, no cyanosis or edema Pulses: 2+ and symmetric Skin: Skin color, texture, turgor normal. No rashes or lesions Neurologic: Alert and oriented X 3, normal strength and tone. Normal symmetric reflexes. Normal coordination and gait  EKG not performed today  ASSESSMENT AND PLAN:   Claudication in peripheral vascular disease ([place]) Long history of right hip and lower extremity discomfort now status post VBX covered stenting using 2 overlapping stents of a right common iliac CTO on 08/11/2019.  His ABIs normalized as did his Dopplers.  His claudication has  resolved.  He is on aspirin Plavix.  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  We will get Dopplers on him in 6 months and I will see him back after that for follow-up.      Lorretta Harp MD FACP,FACC,FAHA, [facility] 08/27/2019 10:24 AM

## 2019-08-27 NOTE — Patient Instructions (Signed)
Medication Instructions:  Your physician recommends that you continue on your current medications as directed. Please refer to the Current Medication list given to you today.  If you need a refill on your cardiac medications before your next appointment, please call your pharmacy.   Lab work: NONE  Testing/Procedures: Aortic/Iliac Dopplers in 6 months  Follow-Up: At [facility], you and your health needs are our priority.  As part of our continuing mission to provide you with exceptional heart care, we have created designated Provider Care Teams.  These Care Teams include your primary Cardiologist (physician) and Advanced Practice Providers (APPs -  Physician Assistants and Nurse Practitioners) who all work together to provide you with the care you need, when you need it. You may see Dr. Gwenlyn Found or one of the following Advanced Practice Providers on your designated Care Team:    Kerin Ransom, PA-C  [place], [place]  Coletta Memos, [place]  Your physician wants you to follow-up in: 6 months with Dr. Gwenlyn Found. You will receive a reminder letter in the mail two months in advance. If you don't receive a letter, please call our office to schedule the follow-up appointment.

## 2019-08-27 NOTE — Assessment & Plan Note (Signed)
Long history of right hip and lower extremity discomfort now status post VBX covered stenting using 2 overlapping stents of a right common iliac CTO on 08/11/2019.  His ABIs normalized as did his Dopplers.  His claudication has resolved.  He is on aspirin Plavix.  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  We will get Dopplers on him in 6 months and I will see him back after that for follow-up.

## 2019-09-05 ENCOUNTER — Ambulatory Visit: Payer: Self-pay | Admitting: Podiatry

## 2019-09-05 ENCOUNTER — Other Ambulatory Visit: Payer: Self-pay

## 2019-09-05 ENCOUNTER — Encounter: Payer: Self-pay | Admitting: Podiatry

## 2019-09-05 DIAGNOSIS — B351 Tinea unguium: Secondary | ICD-10-CM | POA: Insufficient documentation

## 2019-09-05 DIAGNOSIS — E119 Type 2 diabetes mellitus without complications: Secondary | ICD-10-CM | POA: Diagnosis not present

## 2019-09-05 DIAGNOSIS — M79675 Pain in left toe(s): Secondary | ICD-10-CM | POA: Diagnosis not present

## 2019-09-05 DIAGNOSIS — I739 Peripheral vascular disease, unspecified: Secondary | ICD-10-CM | POA: Diagnosis not present

## 2019-09-05 DIAGNOSIS — M79674 Pain in right toe(s): Secondary | ICD-10-CM

## 2019-09-05 NOTE — Progress Notes (Signed)
This patient returns to my office for at risk foot care.  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  Patient is taking plavix.  Patient had stents placed in his right leg due to claudication.  This patient is unable to cut nails himself since the patient cannot reach his nails.These nails are painful walking and wearing shoes.  This patient presents for at risk foot care today.  General Appearance  Alert, conversant and in no acute stress.  Vascular  Dorsalis pedis and posterior tibial  pulses are palpable  bilaterally.  Capillary return is within normal limits  bilaterally. Temperature is within normal limits  bilaterally.  Neurologic  Senn-Weinstein monofilament wire test within normal limits  bilaterally. Muscle power within normal limits bilaterally.  Nails Thick disfigured discolored nails with subungual debris  from hallux to fifth toes bilaterally. No evidence of bacterial infection or drainage bilaterally.  Orthopedic  No limitations of motion  feet .  No crepitus or effusions noted.  No bony pathology or digital deformities noted.  Skin  normotropic skin with no porokeratosis noted bilaterally.  No signs of infections or ulcers noted.   Incision over 1st MPJ right foot.  Onychomycosis  Pain in right toes  Pain in left toes  Consent was obtained for treatment procedures.   Mechanical debridement of nails 1-5  bilaterally performed with a nail nipper.  Filed with dremel without incident. No infection or ulcer.     Return office visit    3 months                  Told patient to return for periodic foot care and evaluation due to potential at risk complications.   Gardiner Barefoot DPM

## 2019-09-09 ENCOUNTER — Ambulatory Visit: Payer: Medicare Other | Attending: Internal Medicine

## 2019-09-09 DIAGNOSIS — Z23 Encounter for immunization: Secondary | ICD-10-CM

## 2019-09-09 NOTE — Progress Notes (Signed)
   Covid-19 Vaccination Clinic  Name:  Adam Perkins    MRN: [id number] DOB: Nov 27, 1950  09/09/2019  [name] was observed post Covid-19 immunization for 15 minutes without incident. He was provided with Vaccine Information Sheet and instruction to access the V-Safe system.   [name] was instructed to call 911 with any severe reactions post vaccine: Marland Kitchen Difficulty breathing  . Swelling of face and throat  . A fast heartbeat  . A bad rash all over body  . Dizziness and weakness   Immunizations Administered    Name Date Dose VIS Date Route   Pfizer COVID-19 Vaccine 09/09/2019 12:17 PM 0.3 mL 05/30/2019 Intramuscular   Manufacturer: [place]   Lot: [id number]   [place]: [id number]

## 2019-09-10 MED FILL — CLOPIDOGREL 75 MG TABLET: 75 | 30 days supply | Qty: 30 | Fill #0

## 2019-09-10 MED FILL — ATORVASTATIN 40 MG TABLET: 40 | 90 days supply | Qty: 90 | Fill #1

## 2019-09-10 MED FILL — METFORMIN HCL ER 500 MG TB2: 500 | 90 days supply | Qty: 90 | Fill #1

## 2019-09-10 MED FILL — LISINOPRIL 10 MG TABS: 10 | 90 days supply | Qty: 90 | Fill #1

## 2019-09-12 ENCOUNTER — Telehealth: Payer: Self-pay | Admitting: Family Medicine

## 2019-09-12 NOTE — Telephone Encounter (Signed)
Left message for patient to schedule Annual Wellness Visit.  Please schedule with Nurse Health Advisor Victoria Britt, RN at [facility]  

## 2019-09-15 NOTE — Chronic Care Management (AMB) (Signed)
Chronic Care Management Pharmacy  Name: Adam Perkins  MRN: [id number] DOB: [birth year]  Chief Complaint/ HPI  Adam Perkins,  69 y.o. , male presents for their Initial CCM visit with the clinical pharmacist via telephone due to COVID-19 Pandemic.  PCP : Ronnald Nian, DO  Their chronic conditions include: Hypertension, Peripheral arterial disease, hyperlipidemia, type 2 diabetes, osteoarthritis  Office Visits: 07/17/19: patient presented to Dr. Bryan Lemma for DM follow-up. A1c 7.0%, BP improved on lisinopril 118-145/56-78. No medication changes noted.   Consult Visits: 09/05/19: Patient referred to Dr. Gardiner Barefoot (podaitry) for onychomycosis of toenails of both feet. Debridement of nails as patient unable to reach his feet. Plan for follow-up in 3 months. 08/27/10: Patient presented to Dr. Quay Burow for PAD follow-up. 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 Claudication improved. No medication changes noted. Follow-up in 6 months  08/11/19: Patient admitted for doppler +stenting for PAD. Discharged without complication on asa + clopidgrel.  07/31/19: Patient presented to Isidore Moos, RD for diabetes and nutrition education.  07/23/19: patient referred to Dr. Quay Burow (cardiology) for PAD follow-up. Cardiac catheterization planned. Patient to continue aspirin 81 mg daily. 07/11/19: Patient presented to Dr. Fransico Michael (Cardiology) for HTN follow-up. BP noted to be controlled, cholesterol severely elevated likely secondary to diabetes/diet. No medication changes noted.   Medications: Outpatient Encounter Medications as of 09/16/2019  Medication Sig  . Ascorbic Acid (VITAMIN C) 1000 MG tablet Take 100 mg by mouth daily.  Marland Kitchen aspirin EC 81 MG tablet Take 81 mg by mouth daily.  Marland Kitchen atorvastatin (LIPITOR) 40 MG tablet Take 1 tablet (40 mg total) by mouth daily.  . clopidogrel (PLAVIX) 75 MG tablet Take 1 tablet (75 mg total) by mouth daily with  breakfast.  . lisinopril (ZESTRIL) 10 MG tablet Take 1 tablet (10 mg total) by mouth daily.  . metFORMIN (GLUCOPHAGE XR) 500 MG 24 hr tablet Take 1 tablet (500 mg total) by mouth daily with breakfast.  . selenium 50 MCG TABS tablet Take 50 mcg by mouth daily.  . timolol (TIMOPTIC) 0.5 % ophthalmic solution Place 1 drop into both eyes daily.   . vitamin E 200 UNIT capsule Take 200 Units by mouth daily.  . timolol (BETIMOL) 0.25 % ophthalmic solution Place 1 drop into both eyes daily.    Facility-Administered Encounter Medications as of 09/16/2019  Medication  . sodium chloride flush (NS) 0.9 % injection 3 mL     Current Diagnosis/Assessment:  Goals Addressed            This Visit's Progress   . Chronic Care Management       CARE PLAN ENTRY  Current Barriers:  . Chronic Disease Management support, education, and care coordination needs related to Hypertension, Peripheral arterial disease, hyperlipidemia, type 2 diabetes, osteoarthritis  Clinical Goal(s): Over the next 30 days, patient will:  . Work with the care management team to address educational, disease management, and care coordination needs  . Begin or continue self health monitoring activities as directed today  . Call provider office for new or worsened signs and symptoms  . Call care management team with questions or concerns . Work towards quitting smoking  . Achieve A1c less than 6.5%  . Achieve LDL (bad cholesterol) less than 70  . Maintain blood pressure less than 130/80    Interventions related to:  Marland Kitchen Evaluation of current treatment plans and patient's adherence to plan as established by provider .  Assessed patient understanding of disease states . Assessed patient's education and care coordination needs . Provided disease specific education to patient  . Continue working towards lifestyle changes (diet and exercise) to improve your health   Verbal consent obtained for [facility] enhanced pharmacy  services (medication synchronization, adherence packaging, delivery coordination). A medication sync plan was created to allow patient to get all medications delivered once every 30 to 90 days per patient preference. Patient understands they have freedom to choose pharmacy and clinical pharmacist will coordinate care between all prescribers and [facility].   Telephone follow up appointment with pharmacy team member scheduled for: 10/14/19 at 9:30 AM       Diabetes   Recent Relevant Labs: Lab Results  Component Value Date/Time   HGBA1C 7.0 (H) 06/17/2019 11:14 AM   HGBA1C 6.2 11/30/2015 12:05 PM   MICROALBUR 1.5 11/30/2015 12:05 PM     Checking BG: Never  Recent FBG Readings: n/a Recent pre-meal BG readings: n/a Recent 2hr PP BG readings:  n/a Recent HS BG readings: n/a  Patient has failed these meds in past: n/a Patient is currently uncontrolled on the following medications:   Metformin XR 500 mg daily A1c goal <6.5%   Last diabetic Foot exam: No results found for: HMDIABEYEEXA  Last diabetic Eye exam: No results found for: HMDIABFOOTEX   We discussed: diet and exercise extensively. Loves to cook. Has been make conscious substitutions to eat healthier (butter -> ghee) (sour cream -> yogurt). Has been trying to eliminate bread. We discussed in detail the pathophysiology of diabetes, insulin and blood sugar. We also reviewed the role of metformin at improving insulin sensitivity and lowering blood sugars.    Plan  Continue current medications  Recommend A1c   Hypertension   Office blood pressures are  BP Readings from Last 3 Encounters:  09/05/19 138/87  08/27/19 128/62  08/12/19 126/72   CMP Latest Ref Rng & Units 08/12/2019 07/23/2019 06/17/2019  Glucose 70 - 99 mg/dL 125(H) 122(H) 143(H)  BUN 8 - 23 mg/dL 10 13 14   Creatinine 0.61 - 1.24 mg/dL 0.78 0.80 0.74  Sodium 135 - 145 mmol/L 139 138 135  Potassium 3.5 - 5.1 mmol/L 3.7 5.0 4.3  Chloride 98 - 111 mmol/L  107 100 101  CO2 22 - 32 mmol/L 24 23 26   Calcium 8.9 - 10.3 mg/dL 8.7(L) 9.8 9.5  Total Protein 6.1 - 8.1 g/dL - - -  Total Bilirubin 0.2 - 1.2 mg/dL - - -  Alkaline Phos 40 - 115 U/L - - -  AST 10 - 35 U/L - - -  ALT 9 - 46 U/L - - -    Patient has failed these meds in the past: n/a Patient is currently controlled on the following medications:   Lisinopril 10 mg daily   Patient checks BP at home daily  Patient home BP readings are ranging: 121/69, 127/70, 121/61   We discussed diet and exercise extensively. Walking more consistently (2.5 miles daily). Wants to get back into the gym.   Plan  Continue current medications.   Hyperlipidemia   Lipid Panel     Component Value Date/Time   CHOL 306 (H) 06/17/2019 1114   TRIG 215.0 (H) 06/17/2019 1114   HDL 42.50 06/17/2019 1114   CHOLHDL 7 06/17/2019 1114   VLDL 43.0 (H) 06/17/2019 1114   LDLDIRECT 220.0 06/17/2019 1114     The 10-year ASCVD risk score Mikey Bussing DC Jr., et al., 2013) is: 55.9%  Values used to calculate the score:     Age: 5 years     Sex: Male     Is Non-Hispanic African American: No     Diabetic: Yes     Tobacco smoker: Yes     Systolic Blood Pressure: 0000000 mmHg     Is BP treated: Yes     HDL Cholesterol: 42.5 mg/dL     Total Cholesterol: 306 mg/dL   Patient has failed these meds in past: pravastatin Patient is currently uncontrolled on the following medications:   Atorvastatin 40 mg daily   We discussed:  diet and exercise extensively  Plan  Recommend Fasting Lipid Panel  Continue current medications  PAD   Patient has failed these meds in past: n/a Patient is currently controlled on the following medications:   Clopidogrel 75 mg daily (6 month duration)   Aspirin 81 mg daily   We discussed:  Light bleeding for 2 hours after he cut himself shaving, otherwise no unusual bruising/bleeding. Denies blood in urine/stool. Denies symptoms of claudication.   Plan  Continue current medications     Smoking Cessation    Patient has failed these meds in past:  Patient is currently uncontrolled on the following medications: nicotine patches 21 mg.   We discussed: Currently smoking 1 ppd. Does not feel the patches have helped him curb his smoking desires.    Plan  Continue current medications   Misc/OTC   Selenium 50 mcg daily- free radicals  Timolol 0.5% opth Vitamin E 200u daily -free radicals  Vitamin C 1000 mg daily -free radicals   Vaccines   Reviewed and discussed patient's vaccination history.    Immunization History  Administered Date(s) Administered  . Fluad Quad(high Dose 65+) 06/17/2019  . PFIZER SARS-COV-2 Vaccination 08/15/2019, 09/09/2019  . Pneumococcal Conjugate-13 06/17/2019  . Tdap 07/14/2013  . Zoster Recombinat (Shingrix) 06/17/2019    Plan  Recommended patient receive 2nd dose of Shingrix vaccine  Medication Management   Pt uses [facility] for all medications. No >5 day gaps in external fill history noted. Patient does not use a pillbox, but expressed an interest in enhanced deliver services.   Plan  Verbal consent obtained for [facility] enhanced pharmacy services (medication synchronization, adherence packaging, delivery coordination). A medication sync plan was created to allow patient to get all medications delivered once every 30 to 90 days per patient preference. Patient understands they have freedom to choose pharmacy and clinical pharmacist will coordinate care between all prescribers and [facility].   Follow up: 76-month to address smoking cessation.   Doristine Section [phone]

## 2019-09-15 NOTE — Progress Notes (Signed)
  Chronic Care Management   Note  09/15/2019 Name: Adam Perkins MRN: [id number] DOB: [birth year]  Adam Perkins is a 69 y.o. year old male who is a primary care patient of Ronnald Nian, DO. I reached out to [name] by phone today in response to a referral sent by Adam Perkins PCP, Ronnald Nian, DO.   Adam Perkins was given information about Chronic Care Management services today including:  1. CCM service includes personalized support from designated clinical staff supervised by his physician, including individualized plan of care and coordination with other care providers 2. 24/7 contact phone numbers for assistance for urgent and routine care needs. 3. Service will only be billed when office clinical staff spend 20 minutes or more in a month to coordinate care. 4. Only one practitioner may furnish and bill the service in a calendar month. 5. The patient may stop CCM services at any time (effective at the end of the month) by phone call to the office staff.   Patient agreed to services and verbal consent obtained.   Follow up plan:   Earney [facility]

## 2019-09-16 ENCOUNTER — Ambulatory Visit: Payer: Medicare Other

## 2019-09-16 ENCOUNTER — Telehealth: Payer: Self-pay

## 2019-09-16 ENCOUNTER — Other Ambulatory Visit: Payer: Self-pay | Admitting: Family Medicine

## 2019-09-16 DIAGNOSIS — E1169 Type 2 diabetes mellitus with other specified complication: Secondary | ICD-10-CM

## 2019-09-16 DIAGNOSIS — E119 Type 2 diabetes mellitus without complications: Secondary | ICD-10-CM

## 2019-09-16 DIAGNOSIS — I10 Essential (primary) hypertension: Secondary | ICD-10-CM

## 2019-09-16 MED ORDER — LISINOPRIL 10 MG PO TABS: 10.0000 mg | ORAL_TABLET | Freq: Every day | ORAL | 3 refills | Status: DC

## 2019-09-16 MED ORDER — METFORMIN HCL ER 500 MG PO TB24: 500.0000 mg | ORAL_TABLET | Freq: Every day | ORAL | 3 refills | Status: DC

## 2019-09-16 MED ORDER — ATORVASTATIN CALCIUM 40 MG PO TABS: 40.0000 mg | ORAL_TABLET | Freq: Every day | ORAL | 3 refills | Status: DC

## 2019-09-16 NOTE — Telephone Encounter (Signed)
Rx'x sent to new pharmacy.

## 2019-09-16 NOTE — Addendum Note (Signed)
Addended by: Darral Dash on: 09/16/2019 11:27 AM   Modules accepted: Orders

## 2019-09-16 NOTE — Telephone Encounter (Signed)
Patient will be using [facility] services going forward. Can you please send refills of the following medications?  -Metformin XR 500 mg daily  -Atorvastatin 40 mg daily  -Lisinopril 10 mg daily   Haze Boyden [phone]

## 2019-09-16 NOTE — Patient Instructions (Addendum)
Visit Information  Goals Addressed            This Visit's Progress   . Chronic Care Management       CARE PLAN ENTRY  Current Barriers:  . Chronic Disease Management support, education, and care coordination needs related to Hypertension, Peripheral arterial disease, hyperlipidemia, type 2 diabetes, osteoarthritis  Clinical Goal(s): Over the next 30 days, patient will:  . Work with the care management team to address educational, disease management, and care coordination needs  . Begin or continue self health monitoring activities as directed today  . Call provider office for new or worsened signs and symptoms  . Call care management team with questions or concerns . Work towards quitting smoking  . Achieve A1c less than 6.5%  . Achieve LDL (bad cholesterol) less than 70  . Maintain blood pressure less than 130/80    Interventions related to:  Marland Kitchen Evaluation of current treatment plans and patient's adherence to plan as established by provider . Assessed patient understanding of disease states . Assessed patient's education and care coordination needs . Provided disease specific education to patient  . Continue working towards lifestyle changes (diet and exercise) to improve your health   Verbal consent obtained for [facility] enhanced pharmacy services (medication synchronization, adherence packaging, delivery coordination). A medication sync plan was created to allow patient to get all medications delivered once every 30 to 90 days per patient preference. Patient understands they have freedom to choose pharmacy and clinical pharmacist will coordinate care between all prescribers and [facility].   Telephone follow up appointment with pharmacy team member scheduled for: 10/14/19 at 9:30 AM       Adam Perkins was given information about Chronic Care Management services today including:  1. CCM service includes personalized support from designated clinical staff  supervised by his physician, including individualized plan of care and coordination with other care providers 2. 24/7 contact phone numbers for assistance for urgent and routine care needs. 3. Standard insurance, coinsurance, copays and deductibles apply for chronic care management only during months in which we provide at least 20 minutes of these services. Most insurances cover these services at 100%, however patients may be responsible for any copay, coinsurance and/or deductible if applicable. This service may help you avoid the need for more expensive face-to-face services. 4. Only one practitioner may furnish and bill the service in a calendar month. 5. The patient may stop CCM services at any time (effective at the end of the month) by phone call to the office staff.  Patient agreed to services and verbal consent obtained.   The patient verbalized understanding of instructions provided today and agreed to receive a mailed copy of patient instruction and/or educational materials. Telephone follow up appointment with pharmacy team member scheduled for: 10/14/19 at 9:30 AM  Doristine Section [phone]  Carbohydrate Counting for Diabetes Mellitus, Adult  Carbohydrate counting is a method of keeping track of how many carbohydrates you eat. Eating carbohydrates naturally increases the amount of sugar (glucose) in the blood. Counting how many carbohydrates you eat helps keep your blood glucose within normal limits, which helps you manage your diabetes (diabetes mellitus). It is important to know how many carbohydrates you can safely have in each meal. This is different for every person. A diet and nutrition specialist (registered dietitian) can help you make a meal plan and calculate how many carbohydrates you should have at each meal and snack. Carbohydrates are found in the following foods:  Grains, such as breads and cereals.  Dried beans and soy products.  Starchy vegetables, such as  potatoes, peas, and corn.  Fruit and fruit juices.  Milk and yogurt.  Sweets and snack foods, such as cake, cookies, candy, chips, and soft drinks. How do I count carbohydrates? There are two ways to count carbohydrates in food. You can use either of the methods or a combination of both. Reading "Nutrition Facts" on packaged food The "Nutrition Facts" list is included on the labels of almost all packaged foods and beverages in the [place] It includes:  The serving size.  Information about nutrients in each serving, including the grams (g) of carbohydrate per serving. To use the "Nutrition Facts":  Decide how many servings you will have.  Multiply the number of servings by the number of carbohydrates per serving.  The resulting number is the total amount of carbohydrates that you will be having. Learning standard serving sizes of other foods When you eat carbohydrate foods that are not packaged or do not include "Nutrition Facts" on the label, you need to measure the servings in order to count the amount of carbohydrates:  Measure the foods that you will eat with a food scale or measuring cup, if needed.  Decide how many standard-size servings you will eat.  Multiply the number of servings by 15. Most carbohydrate-rich foods have about 15 g of carbohydrates per serving. ? For example, if you eat 8 oz (170 g) of strawberries, you will have eaten 2 servings and 30 g of carbohydrates (2 servings x 15 g = 30 g).  For foods that have more than one food mixed, such as soups and casseroles, you must count the carbohydrates in each food that is included. The following list contains standard serving sizes of common carbohydrate-rich foods. Each of these servings has about 15 g of carbohydrates:   hamburger bun or  English muffin.   oz (15 mL) syrup.   oz (14 g) jelly.  1 slice of bread.  1 six-inch tortilla.  3 oz (85 g) cooked rice or pasta.  4 oz (113 g) cooked dried  beans.  4 oz (113 g) starchy vegetable, such as peas, corn, or potatoes.  4 oz (113 g) hot cereal.  4 oz (113 g) mashed potatoes or  of a large baked potato.  4 oz (113 g) canned or frozen fruit.  4 oz (120 mL) fruit juice.  4-6 crackers.  6 chicken nuggets.  6 oz (170 g) unsweetened dry cereal.  6 oz (170 g) plain fat-free yogurt or yogurt sweetened with artificial sweeteners.  8 oz (240 mL) milk.  8 oz (170 g) fresh fruit or one small piece of fruit.  24 oz (680 g) popped popcorn. Example of carbohydrate counting Sample meal  3 oz (85 g) chicken breast.  6 oz (170 g) brown rice.  4 oz (113 g) corn.  8 oz (240 mL) milk.  8 oz (170 g) strawberries with sugar-free whipped topping. Carbohydrate calculation 1. Identify the foods that contain carbohydrates: ? Rice. ? Corn. ? Milk. ? Strawberries. 2. Calculate how many servings you have of each food: ? 2 servings rice. ? 1 serving corn. ? 1 serving milk. ? 1 serving strawberries. 3. Multiply each number of servings by 15 g: ? 2 servings rice x 15 g = 30 g. ? 1 serving corn x 15 g = 15 g. ? 1 serving milk x 15 g = 15 g. ? 1 serving strawberries  x 15 g = 15 g. 4. Add together all of the amounts to find the total grams of carbohydrates eaten: ? 30 g + 15 g + 15 g + 15 g = 75 g of carbohydrates total. Summary  Carbohydrate counting is a method of keeping track of how many carbohydrates you eat.  Eating carbohydrates naturally increases the amount of sugar (glucose) in the blood.  Counting how many carbohydrates you eat helps keep your blood glucose within normal limits, which helps you manage your diabetes.  A diet and nutrition specialist (registered dietitian) can help you make a meal plan and calculate how many carbohydrates you should have at each meal and snack. This information is not intended to replace advice given to you by your health care provider. Make sure you discuss any questions you have with  your health care provider. Document Revised: 12/28/2016 Document Reviewed: 11/17/2015 Elsevier Patient Education  [place].

## 2019-09-17 ENCOUNTER — Other Ambulatory Visit: Payer: Self-pay | Admitting: Cardiovascular Disease

## 2019-09-17 MED ORDER — CLOPIDOGREL BISULFATE 75 MG PO TABS: 75.0000 mg | ORAL_TABLET | Freq: Every day | ORAL | 10 refills | Status: DC

## 2019-09-17 NOTE — Telephone Encounter (Signed)
*  STAT* If patient is at the pharmacy, call can be transferred to refill team.   1. Which medications need to be refilled? (please list name of each medication and dose if known)  clopidogrel (PLAVIX) 75 MG tablet  2. Which pharmacy/location (including street and city if local pharmacy) is medication to be sent to?  [facility] - [place], [place] - [place] [address]  3. Do they need a 30 day or 90 day supply? 30 day supply

## 2019-09-24 ENCOUNTER — Telehealth: Payer: Self-pay | Admitting: Family Medicine

## 2019-09-24 NOTE — Telephone Encounter (Signed)
Left message for patient to schedule Annual Wellness Visit.  Please schedule with Nurse Health Advisor Victoria Britt, RN at [facility] [place]  

## 2019-09-29 DIAGNOSIS — Z85828 Personal history of other malignant neoplasm of skin: Secondary | ICD-10-CM | POA: Diagnosis not present

## 2019-09-29 DIAGNOSIS — L821 Other seborrheic keratosis: Secondary | ICD-10-CM | POA: Diagnosis not present

## 2019-09-29 DIAGNOSIS — D2362 Other benign neoplasm of skin of left upper limb, including shoulder: Secondary | ICD-10-CM | POA: Diagnosis not present

## 2019-09-29 DIAGNOSIS — C44519 Basal cell carcinoma of skin of other part of trunk: Secondary | ICD-10-CM | POA: Diagnosis not present

## 2019-10-06 NOTE — Progress Notes (Signed)
Cardiology Office Note:   Date:  10/08/2019  NAME:  Adam Perkins    MRN: [id number] DOB:  May 25, 1951   PCP:  Ronnald Nian, DO  Cardiologist:  Evalina Field, MD   Referring MD: Ronnald Nian, DO   Chief Complaint  Patient presents with  . Follow-up   History of Present Illness:   Adam Perkins is a 69 y.o. male with a hx of HTN, DM, PAD, tobacco abuse who presents for follow-up.  He presents after stenting to his right common iliac artery.  He reports his leg pain has gone away.  He is walking up to 6000 steps daily and denies any chest pain or shortness of breath.  He had one episode of some tightness in his chest about 3 months ago which was similar to the initial event we met.  This was in the morning and resolved with deep breathing.  No further episodes.  He still smoking half pack per day.  He remains on Lipitor 40 mg daily.  Needs repeat lipid profile.  Primary care doctor to repeat his diabetes numbers as well.  He is also working on diet.  He is lost 10 pounds.  I have congratulated him on this.  Blood pressure appears to be well controlled at home.  He will keep a log to let us know.  A bit elevated today but not too bad.  He has plans to go to [place] in the next few weeks and will stay for a bit.  Overall, doing extremely well since his peripheral vascular intervention.   Problem List 1. Diabetes (A1c 7.0) 2. Hypertension 3. Hyperlipidemia (T chol 306, TG 216, HDL 42, LDL 220) 4. PAD  -s/p stent to occluded R common iliac artery 08/11/2019 5. Tobacco abuse (25 pack-years) 6. Aortic stenosis, mild 2021  Past Medical History: Past Medical History:  Diagnosis Date  . Arthritis   . Avascular necrosis of bone of right hip ([place]) 05/28/2014  . Chicken pox   . Diabetes mellitus without complication ([place])   . Elevated blood pressure 08/19/2012  . Hyperglycemia 12/30/2015  . Hyperlipemia 08/19/2012  . Hypertension   . Kidney stone    nephrolithiasis  . Pain in limb  10/20/2013  . 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 ABDOMINAL AORTOGRAM W/LOWER EXTREMITY N/A 08/11/2019   Procedure: ABDOMINAL AORTOGRAM W/LOWER EXTREMITY;  Surgeon: Lorretta Harp, MD;  Location: [facility];  Service: Cardiovascular;  Laterality: N/A;  . CHEILECTOMY Right 04/03/2019   Procedure: RIGHT DORSAL CHEILECTOMY;  Surgeon: Erle Crocker, MD;  Location: [facility];  Service: Orthopedics;  Laterality: Right;  SURGERY REQUEST TIME 90 MIN  . PERIPHERAL VASCULAR INTERVENTION Right 08/11/2019   Procedure: PERIPHERAL VASCULAR INTERVENTION;  Surgeon: Lorretta Harp, MD;  Location: [facility];  Service: Cardiovascular;  Laterality: Right;  Iliac  . TOTAL HIP ARTHROPLASTY Right 05/28/2014   Procedure: RIGHT TOTAL HIP ARTHROPLASTY ANTERIOR APPROACH;  Surgeon: Mcarthur Rossetti, MD;  Location: [facility];  Service: Orthopedics;  Laterality: Right;    Current Medications: Current Meds  Medication Sig  . Ascorbic Acid (VITAMIN C) 1000 MG tablet Take 100 mg by mouth daily.  Marland Kitchen aspirin EC 81 MG tablet Take 81 mg by mouth daily.  Marland Kitchen atorvastatin (LIPITOR) 40 MG tablet Take 1 tablet (40 mg total) by mouth daily.  . clopidogrel (PLAVIX)  75 MG tablet Take 1 tablet (75 mg total) by mouth daily with breakfast.  . lisinopril (ZESTRIL) 10 MG tablet Take 1 tablet (10 mg total) by mouth daily.  . metFORMIN (GLUCOPHAGE XR) 500 MG 24 hr tablet Take 1 tablet (500 mg total) by mouth daily with breakfast.  . selenium 50 MCG TABS tablet Take 50 mcg by mouth daily.  . timolol (BETIMOL) 0.25 % ophthalmic solution Place 1 drop into both eyes daily.   . timolol (TIMOPTIC) 0.5 % ophthalmic solution Place 1 drop into both eyes daily.   . vitamin E 200 UNIT capsule Take 200 Units by mouth daily.   Current Facility-Administered Medications for the 10/08/19 encounter (Office Visit)  with Geralynn Rile, MD  Medication  . sodium chloride flush (NS) 0.9 % injection 3 mL     Allergies:    Patient has no known allergies.   Social History: Social History   Socioeconomic History  . Marital status: Married    Spouse name: Not on file  . Number of children: 2  . Years of education: Not on file  . Highest education level: Not on file  Occupational History  . Not on file  Tobacco Use  . Smoking status: Current Every Day Smoker    Packs/day: 1.00    Years: 25.00    Pack years: 25.00    Types: Cigarettes  . Smokeless tobacco: Never Used  . Tobacco comment: quit 2012 after smoking on and off a little for year. Now current smoker  Substance and Sexual Activity  . Alcohol use: Yes    Alcohol/week: 0.0 standard drinks    Comment: 2 glasses of wine daily   . Drug use: No  . Sexual activity: Not Currently  Other Topics Concern  . Not on file  Social History Narrative  . Not on file   Social Determinants of Health   Financial Resource Strain:   . Difficulty of Paying Living Expenses:   Food Insecurity:   . Worried About Charity fundraiser in the Last Year:   . Arboriculturist in the Last Year:   Transportation Needs:   . Film/video editor (Medical):   Marland Kitchen Lack of Transportation (Non-Medical):   Physical Activity:   . Days of Exercise per Week:   . Minutes of Exercise per Session:   Stress:   . Feeling of Stress :   Social Connections:   . Frequency of Communication with Friends and Family:   . Frequency of Social Gatherings with Friends and Family:   . Attends Religious Services:   . Active Member of Clubs or Organizations:   . Attends Archivist Meetings:   Marland Kitchen Marital Status:     Family History: The patient's family history includes Arthritis in his father; Cancer (age of onset: 44) in his father; Diabetes in his mother.  ROS:   All other ROS reviewed and negative. Pertinent positives noted in the HPI.     EKGs/Labs/Other  Studies Reviewed:   The following studies were personally reviewed by me today:  Abdominal Aortogram  Procedures Performed:               1.  Ultrasound-guided right and left common femoral artery access               2.  Abdominal aortogram/bilateral iliac angiogram/bifemoral runoff               3.  VBX covered stenting right common iliac  artery CTO using overlapping VBX stents (7 x 39, 7 x 29)               4.  Left common femoral artery angiogram, MYNX closure left common femoral artery  TTE 06/27/2019 1. Left ventricular ejection fraction, by visual estimation, is 60 to  65%. The left ventricle has normal function. There is mildly increased  left ventricular hypertrophy.  2. Left ventricular diastolic parameters are consistent with Grade I  diastolic dysfunction (impaired relaxation).  3. The left ventricle has no regional wall motion abnormalities.  4. Global right ventricle has normal systolic function.The right  ventricular size is normal. No increase in right ventricular wall  thickness.  5. Left atrial size was mildly dilated.  6. Right atrial size was normal.  7. Mild mitral annular calcification.  8. The mitral valve is normal in structure. No evidence of mitral valve  regurgitation. No evidence of mitral stenosis.  9. The tricuspid valve is normal in structure.  10. The aortic valve is tricuspid. Aortic valve regurgitation is not  visualized. Mild aortic valve stenosis. Mean gradient 10 mmHg, AVA 2.0  cm^2.  11. The inferior vena cava is normal in size with greater than 50%  respiratory variability, suggesting right atrial pressure of 3 mmHg.  12. TR signal is inadequate for assessing pulmonary artery systolic  pressure.   NM Stress 06/25/2019  Nuclear stress EF: 57%. The left ventricular ejection fraction is normal (55-65%).  There was no ST segment deviation noted during stress.  This is a low risk study. There is no evidence of ischemia. There is no  evidence of previous myocardial infarction.  The study is normal.   Recent Labs: 08/12/2019: BUN 10; Creatinine, Ser 0.78; Hemoglobin 12.0; Platelets 202; Potassium 3.7; Sodium 139   Recent Lipid Panel    Component Value Date/Time   CHOL 306 (H) 06/17/2019 1114   TRIG 215.0 (H) 06/17/2019 1114   HDL 42.50 06/17/2019 1114   CHOLHDL 7 06/17/2019 1114   VLDL 43.0 (H) 06/17/2019 1114   LDLDIRECT 220.0 06/17/2019 1114    Physical Exam:   VS:  BP (!) 152/70   Pulse 65   Ht _0  (1.753 m)   Wt 186 lb (84.4 kg)   SpO2 99%   BMI 27.47 kg/m    Wt Readings from Last 3 Encounters:  10/08/19 186 lb (84.4 kg)  08/27/19 185 lb (83.9 kg)  08/12/19 189 lb 1.6 oz (85.8 kg)    General: Well nourished, well developed, in no acute distress Heart: Atraumatic, normal size  Eyes: PEERLA, EOMI  Neck: Supple, no JVD Endocrine: No thryomegaly Cardiac: Normal S1, S2; RRR; no murmurs, rubs, or gallops Lungs: Clear to auscultation bilaterally, no wheezing, rhonchi or rales  Abd: Soft, nontender, no hepatomegaly  Ext: 2+ pulses in the right lower extremity, diminished pulses in the left lower extremity Musculoskeletal: No deformities, BUE and BLE strength normal and equal Skin: Warm and dry, no rashes   Neuro: Alert and oriented to person, place, time, and situation, CNII-XII grossly intact, no focal deficits  Psych: Normal mood and affect   ASSESSMENT:   Adam Perkins is a 69 y.o. male who presents for the following: 1. PAD (peripheral artery disease) ([place])   2. Essential hypertension   3. Mixed hyperlipidemia   4. Tobacco abuse   5. Aortic stenosis, mild     PLAN:   1. PAD (peripheral artery disease) ([facility]) -Status post stenting to the right common iliac  artery.  Symptoms of leg pain have resolved.  Does have some residual disease in the left lower extremity and will be following with Dr. Alvester Chou.  Continue aspirin and Plavix for now. -Repeat lipid profile today.  Continue Lipitor 40 mg  daily.  2. Essential hypertension -Blood pressure bit elevated today.  Log at home appears to be within normal range.  He will continue to keep a log and let us know.  He is working on diet and exercise more.  He has lost 10 pounds.  Things seem to be going well.  3. Mixed hyperlipidemia -Most recent LDL cholesterol severely elevated.  I briefly discussed neck testing with him.  No strong family history of early heart disease.  Children do not seem to have high cholesterol either.  We will just continue with statin therapy for now.  Repeat lipid profile today.  4. Tobacco abuse -Still smoking half pack per day.  Trying to quit.  No set quit date.  He will continue to work on this.  5. Aortic stenosis, mild -Repeat echocardiogram 3 to 5 years  Disposition: Return in about 6 months (around 04/08/2020).  Medication Adjustments/Labs and Tests Ordered: Current medicines are reviewed at length with the patient today.  Concerns regarding medicines are outlined above.  Orders Placed This Encounter  Procedures  . Lipid panel   No orders of the defined types were placed in this encounter.   Patient Instructions  Medication Instructions:  The current medical regimen is effective;  continue present plan and medications.  *If you need a refill on your cardiac medications before your next appointment, please call your pharmacy*   Lab Work: LIPID TODAY  If you have labs (blood work) drawn today and your tests are completely normal, you will receive your results only by: Marland Kitchen MyChart Message (if you have MyChart) OR . A paper copy in the mail If you have any lab test that is abnormal or we need to change your treatment, we will call you to review the results.   Follow-Up: At [facility], you and your health needs are our priority.  As part of our continuing mission to provide you with exceptional heart care, we have created designated Provider Care Teams.  These Care Teams include your  primary Cardiologist (physician) and Advanced Practice Providers (APPs -  Physician Assistants and Nurse Practitioners) who all work together to provide you with the care you need, when you need it.  We recommend signing up for the patient portal called "MyChart".  Sign up information is provided on this After Visit Summary.  MyChart is used to connect with patients for Virtual Visits (Telemedicine).  Patients are able to view lab/test results, encounter notes, upcoming appointments, etc.  Non-urgent messages can be sent to your provider as well.   To learn more about what you can do with MyChart, go to [email or web address].    Your next appointment:   6 month(s)  The format for your next appointment:   In Person  Provider:   Eleonore Chiquito, MD      Time Spent with Patient: I have spent a total of 35 minutes with patient reviewing hospital notes, telemetry, EKGs, labs and examining the patient as well as establishing an assessment and plan that was discussed with the patient.  > 50% of time was spent in direct patient care.  Signed, Addison Naegeli. Audie Box, [place]  [address] [place], [place] [address] [phone]  10/08/2019  10:17 AM

## 2019-10-07 NOTE — Chronic Care Management (AMB) (Deleted)
Tobacco Abuse   Tobacco Status:  Social History   Tobacco Use  Smoking Status Current Every Day Smoker  . Packs/day: 1.00  . Years: 25.00  . Pack years: 25.00  . Types: Cigarettes  Smokeless Tobacco Never Used  Tobacco Comment   quit 2012 after smoking on and off a little for year. Now current smoker    Patient smokes {Time to first cigarette:23873} Patient triggers include: {Smoking Triggers:23882} On a scale of 1-10, reports MOTIVATION to quit is *** On a scale of 1-10, reports CONFIDENCE in quitting is ***  Patient has failed these meds in past: *** Patient is currently {CHL Controlled/Uncontrolled:[phone]} on the following medications: ***  We discussed:  counseled on patch placement, side effects, and option to remove at night if they experience trouble sleeping or bad dreams.  and Provided contact information for [place] Quit Line (1-800-QUIT-NOW). Patient will outreach this group for support.  Plan  Continue {CHL [facility] PH:[phone]

## 2019-10-08 ENCOUNTER — Ambulatory Visit: Payer: Medicare Other | Admitting: Cardiovascular Disease

## 2019-10-08 ENCOUNTER — Encounter: Payer: Self-pay | Admitting: Cardiovascular Disease

## 2019-10-08 ENCOUNTER — Other Ambulatory Visit: Payer: Self-pay

## 2019-10-08 VITALS — BP 152/70 | HR 65 | Ht 69.0 in | Wt 186.0 lb

## 2019-10-08 DIAGNOSIS — I350 Nonrheumatic aortic (valve) stenosis: Secondary | ICD-10-CM | POA: Diagnosis not present

## 2019-10-08 DIAGNOSIS — Z720 Tobacco use: Secondary | ICD-10-CM

## 2019-10-08 DIAGNOSIS — E782 Mixed hyperlipidemia: Secondary | ICD-10-CM

## 2019-10-08 DIAGNOSIS — E785 Hyperlipidemia, unspecified: Secondary | ICD-10-CM

## 2019-10-08 DIAGNOSIS — I10 Essential (primary) hypertension: Secondary | ICD-10-CM | POA: Diagnosis not present

## 2019-10-08 DIAGNOSIS — I739 Peripheral vascular disease, unspecified: Secondary | ICD-10-CM

## 2019-10-08 DIAGNOSIS — E1169 Type 2 diabetes mellitus with other specified complication: Secondary | ICD-10-CM

## 2019-10-08 NOTE — Patient Instructions (Signed)
Medication Instructions:  The current medical regimen is effective;  continue present plan and medications.  *If you need a refill on your cardiac medications before your next appointment, please call your pharmacy*   Lab Work: LIPID TODAY  If you have labs (blood work) drawn today and your tests are completely normal, you will receive your results only by: Marland Kitchen MyChart Message (if you have MyChart) OR . A paper copy in the mail If you have any lab test that is abnormal or we need to change your treatment, we will call you to review the results.   Follow-Up: At [facility], you and your health needs are our priority.  As part of our continuing mission to provide you with exceptional heart care, we have created designated Provider Care Teams.  These Care Teams include your primary Cardiologist (physician) and Advanced Practice Providers (APPs -  Physician Assistants and Nurse Practitioners) who all work together to provide you with the care you need, when you need it.  We recommend signing up for the patient portal called "MyChart".  Sign up information is provided on this After Visit Summary.  MyChart is used to connect with patients for Virtual Visits (Telemedicine).  Patients are able to view lab/test results, encounter notes, upcoming appointments, etc.  Non-urgent messages can be sent to your provider as well.   To learn more about what you can do with MyChart, go to [email or web address].    Your next appointment:   6 month(s)  The format for your next appointment:   In Person  Provider:   Eleonore Chiquito, MD

## 2019-10-09 LAB — LIPID PANEL
Chol/HDL Ratio: 4.3 ratio (ref 0.0–5.0)
Cholesterol, Total: 176 mg/dL (ref 100–199)
HDL: 41 mg/dL (ref >39)
LDL Chol Calc (NIH): 114 mg/dL — ABNORMAL HIGH (ref 0–99)
Triglycerides: 118 mg/dL (ref 0–149)
VLDL Cholesterol Cal: 21 mg/dL (ref 5–40)

## 2019-10-09 MED ORDER — ATORVASTATIN CALCIUM 80 MG PO TABS: 80.0000 mg | ORAL_TABLET | Freq: Every day | ORAL | 1 refills | Status: DC

## 2019-10-09 NOTE — Addendum Note (Signed)
Addended by: Caprice Beaver T on: 10/09/2019 08:02 AM   Modules accepted: Orders

## 2019-10-14 ENCOUNTER — Ambulatory Visit: Payer: Medicare Other

## 2019-10-14 DIAGNOSIS — I10 Essential (primary) hypertension: Secondary | ICD-10-CM

## 2019-10-14 DIAGNOSIS — E785 Hyperlipidemia, unspecified: Secondary | ICD-10-CM

## 2019-10-14 DIAGNOSIS — E1169 Type 2 diabetes mellitus with other specified complication: Secondary | ICD-10-CM

## 2019-10-14 NOTE — Patient Instructions (Signed)
Visit Information  It was great speaking with you today Adam Perkins. Please be sure to reach out to the [place] quit line to see if they can help provide tobacco cessation products. If you would like, [facility] can provide those products as well. A box of 14 patches costs $29.99, and and box of 81 lozenges would cost $59.19. if you were interested in [place] providing those for you please let me know.   Goals Addressed            This Visit's Progress   . Chronic Care Management       CARE PLAN ENTRY  Current Barriers:  . Chronic Disease Management support, education, and care coordination needs related to Hypertension, Peripheral arterial disease, hyperlipidemia, type 2 diabetes, osteoarthritis  Clinical Goal(s): Over the next 90 days, patient will:  . Work with the care management team to address educational, disease management, and care coordination needs  . Begin or continue self health monitoring activities as directed today  . Call provider office for new or worsened signs and symptoms  . Call care management team with questions or concerns . Work towards quitting smoking  . Achieve A1c less than 6.5%  . Achieve LDL (bad cholesterol) less than 70  . Maintain blood pressure less than 130/80   Interventions related to:  Marland Kitchen Recommend nicotine patch 21 mg/24hr daily for 6 weeks. Counseled on patch placement, side effects, and option to remove at night if they experience trouble sleeping or bad dreams.  . Recommend nicotine lozenge 2 mg as needed for breakthrough cravings. Counseled to allow lozenge to dissolve and absorb in cheek pocket, rather than swallow. . Provided contact information for [place] Quit Line (1-800-QUIT-NOW). Patient will outreach this group for support.  Patient Self Care Activities:  . Patient will commit to reducing tobacco consumption and set a quit date of 11/18/19.  Telephone follow up appointment with pharmacy team member scheduled for: 11/25/19 at 9:30 AM        The patient verbalized understanding of instructions provided today and agreed to receive a mailed copy of patient instruction and/or educational materials.  Telephone follow up appointment with pharmacy team member scheduled for: 11/25/19   [facility] Clinical Pharmacist  Claudie Fisherman  [phone]  Nicotine skin patches What is this medicine? NICOTINE ([place] teen) helps people stop smoking. The patches replace the nicotine found in cigarettes and help to decrease withdrawal effects. They are most effective when used in combination with a stop-smoking program. This medicine may be used for other purposes; ask your health care provider or pharmacist if you have questions. COMMON BRAND NAME(S): Habitrol, Nicoderm CQ, Nicotrol What should I tell my health care provider before I take this medicine? They need to know if you have any of these conditions:  diabetes  heart disease, angina, irregular heartbeat or previous heart attack  high blood pressure  lung disease, including asthma  overactive thyroid  pheochromocytoma  seizures or a history of seizures  skin problems, like eczema  stomach problems or ulcers  an unusual or allergic reaction to nicotine, adhesives, other medicines, foods, dyes, or preservatives  pregnant or trying to get pregnant  breast-feeding How should I use this medicine? This medicine is for use on the skin. Follow the directions that come with the patches. Find an area of skin on your upper arm, chest, or back that is clean, dry, greaseless, undamaged and hairless. Wash hands with plain soap and water. Do not use anything  that contains aloe, lanolin or glycerin as these may prevent the patch from sticking. Dry thoroughly. Remove the patch from the sealed pouch. Do not try to cut or trim the patch. Using your palm, press the patch firmly in place for 10 seconds to make sure that there is good contact with your skin. After applying the  patch, wash your hands. Change the patch every day, keeping to a regular schedule. When you apply a new patch, use a new area of skin. Wait at least 1 week before using the same area again. Talk to your pediatrician regarding the use of this medicine in children. Special care may be needed. Overdosage: If you think you have taken too much of this medicine contact a poison control center or emergency room at once. NOTE: This medicine is only for you. Do not share this medicine with others. What if I miss a dose? If you forget to replace a patch, use it as soon as you can. Only use one patch at a time and do not leave on the skin for longer than directed. If a patch falls off, you can replace it, but keep to your schedule and remove the patch at the right time. What may interact with this medicine?  medicines for asthma  medicines for blood pressure  medicines for mental depression This list may not describe all possible interactions. Give your health care provider a list of all the medicines, herbs, non-prescription drugs, or dietary supplements you use. Also tell them if you smoke, drink alcohol, or use illegal drugs. Some items may interact with your medicine. What should I watch for while using this medicine? You should begin using the nicotine patch the day you stop smoking. It is okay if you do not succeed at your attempt to quit and have a cigarette. You can still continue your quit attempt and keep using the product as directed. Just throw away your cigarettes and get back to your quit plan. You can keep the patch in place during swimming, bathing, and showering. If your patch falls off during these activities, replace it. When you first apply the patch, your skin may itch or burn. This should go away soon. When you remove a patch, the skin may look red, but this should only last for a few days. Call your doctor or health care professional if skin redness does not go away after 4 days, if your  skin swells, or if you get a rash. If you are a diabetic and you quit smoking, the effects of insulin may be increased and you may need to reduce your insulin dose. Check with your doctor or health care professional about how you should adjust your insulin dose. If you are going to have a magnetic resonance imaging (MRI) procedure, tell your MRI technician if you have this patch on your body. It must be removed before a MRI. What side effects may I notice from receiving this medicine? Side effects that you should report to your doctor or health care professional as soon as possible:  allergic reactions like skin rash, itching or hives, swelling of the face, lips, or tongue  breathing problems  changes in hearing  changes in vision  chest pain  cold sweats  confusion  fast, irregular heartbeat  feeling faint or lightheaded, falls  headache  increased saliva  skin redness that lasts more than 4 days  stomach pain  signs and symptoms of nicotine overdose like nausea; vomiting; dizziness; weakness; and rapid  heartbeat Side effects that usually do not require medical attention (report to your doctor or health care professional if they continue or are bothersome):  diarrhea  dry mouth  hiccups  irritability  nervousness or restlessness  trouble sleeping or vivid dreams This list may not describe all possible side effects. Call your doctor for medical advice about side effects. You may report side effects to FDA at 1-800-FDA-1088. Where should I keep my medicine? Keep out of the reach of children. Store at room temperature between 20 and 25 degrees C (68 and 77 degrees F). Protect from heat and light. Store in International aid/development worker until ready to use. Throw away unused medicine after the expiration date. When you remove a patch, fold with sticky sides together; put in an empty opened pouch and throw away. NOTE: This sheet is a summary. It may not cover all possible  information. If you have questions about this medicine, talk to your doctor, pharmacist, or health care provider.  2020 Elsevier/Gold Standard (2014-05-04 15:46:21) Nicotine lozenge What is this medicine? NICOTINE ([place] teen) helps people stop smoking. The lozenges replace the nicotine found in cigarettes and help to decrease withdrawal effects. It is most effective when used in combination with a stop-smoking program. This medicine may be used for other purposes; ask your health care provider or pharmacist if you have questions. COMMON BRAND NAME(S): Commit, NICOrelief, Nicorette What should I tell my health care provider before I take this medicine? They need to know if you have any of these conditions:  diabetes  heart disease, angina, irregular heartbeat or previous heart attack  high blood pressure  lung disease, including asthma  overactive thyroid  pheochromocytoma  seizures or history of seizures  stomach problems or ulcers  an unusual or allergic reaction to nicotine, other medicines, foods, dyes, or preservatives  pregnant or trying to get pregnant  breast-feeding How should I use this medicine? Place the lozenge in the mouth. Suck on the lozenge until it is completely dissolved. Do not swallow the lozenge. Follow the directions carefully that come with the lozenge. Use exactly as directed. Do not use the lozenges more often than directed. Talk to your pediatrician regarding the use of this medicine in children. Special care may be needed. Overdosage: If you think you have taken too much of this medicine contact a poison control center or emergency room at once. NOTE: This medicine is only for you. Do not share this medicine with others. What if I miss a dose? This does not apply. What may interact with this medicine?  medicines for asthma  medicines for blood pressure  medicines for mental depression This list may not describe all possible interactions. Give  your health care provider a list of all the medicines, herbs, non-prescription drugs, or dietary supplements you use. Also tell them if you smoke, drink alcohol, or use illegal drugs. Some items may interact with your medicine. What should I watch for while using this medicine? Always carry the nicotine lozenges with you. You should begin using the nicotine lozenges the day you stop smoking. It is okay if you do not succeed with your attempt to quit and have a cigarette. You can still continue your quit attempt and keep using the product as directed. Just throw away your cigarettes and get back to your quit plan. If you are a diabetic and you quit smoking, the effects of insulin may be increased and you may need to reduce your insulin dose. Check with your  doctor or health care professional about how you should adjust your insulin dose. Brush your teeth regularly to reduce mouth irritation. What side effects may I notice from receiving this medicine? Side effects that you should report to your doctor or health care professional as soon as possible:  allergic reactions like skin rash, itching or hives, swelling of the face, lips, or tongue  breathing problems  changes in hearing  changes in vision  chest pain  cold sweats  confusion  fast, irregular heartbeat  feeling faint or lightheaded, falls  headache  increased saliva  nausea, vomiting  stomach pain  weakness Side effects that usually do not require medical attention (report to your doctor or health care professional if they continue or are bothersome):  diarrhea  dry mouth  hiccups  irritability  nervousness or restlessness  trouble sleeping or vivid dreams This list may not describe all possible side effects. Call your doctor for medical advice about side effects. You may report side effects to FDA at 1-800-FDA-1088. Where should I keep my medicine? Keep out of the reach of children. Store at room temperature  between 15 and 30 degrees C (59 and 86 degrees F). Protect from heat and light. Throw away unused medicine after the expiration date. NOTE: This sheet is a summary. It may not cover all possible information. If you have questions about this medicine, talk to your doctor, pharmacist, or health care provider.  2020 Elsevier/Gold Standard (2014-11-30 19:40:56)

## 2019-10-14 NOTE — Chronic Care Management (AMB) (Signed)
Chronic Care Management Pharmacy  Name: Adam Perkins  MRN: [id number] DOB: [birth year]  Chief Complaint/ HPI  Adam Perkins,  69 y.o. , male presents for their Follow-Up CCM visit with the clinical pharmacist via telephone due to COVID-19 Pandemic.  PCP : Ronnald Nian, DO  Their chronic conditions include: Hypertension, Peripheral arterial disease, hyperlipidemia, type 2 diabetes, osteoarthritis  Office Visits: 07/17/19: patient presented to Dr. Bryan Lemma for DM follow-up. A1c 7.0%, BP improved on lisinopril 118-145/56-78. No medication changes noted.   Consult Visits: 10/08/19: Patient presented to Dr. Fransico Michael (Cardiology) for PAD follow-up. BP in clinic 152/70, LDL improved to 114. Atorvastatin increased to 80 mg daily.  09/05/19: Patient referred to Dr. Gardiner Barefoot (podaitry) for onychomycosis of toenails of both feet. Debridement of nails as patient unable to reach his feet. Plan for follow-up in 3 months. 08/27/10: Patient presented to Dr. Quay Burow for PAD follow-up. 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 Claudication improved. No medication changes noted. Follow-up in 6 months  08/11/19: Patient admitted for doppler +stenting for PAD. Discharged without complication on asa + clopidgrel.  07/31/19: Patient presented to Isidore Moos, RD for diabetes and nutrition education.  07/23/19: patient referred to Dr. Quay Burow (cardiology) for PAD follow-up. Cardiac catheterization planned. Patient to continue aspirin 81 mg daily. 07/11/19: Patient presented to Dr. Fransico Michael (Cardiology) for HTN follow-up. BP noted to be controlled, cholesterol severely elevated likely secondary to diabetes/diet. No medication changes noted.   Medications: Outpatient Encounter Medications as of 10/14/2019  Medication Sig  . Ascorbic Acid (VITAMIN C) 1000 MG tablet Take 100 mg by mouth daily.  Marland Kitchen aspirin EC 81 MG tablet Take 81 mg by mouth daily.  Marland Kitchen  atorvastatin (LIPITOR) 80 MG tablet Take 1 tablet (80 mg total) by mouth daily.  . clopidogrel (PLAVIX) 75 MG tablet Take 1 tablet (75 mg total) by mouth daily with breakfast.  . lisinopril (ZESTRIL) 10 MG tablet Take 1 tablet (10 mg total) by mouth daily.  . metFORMIN (GLUCOPHAGE XR) 500 MG 24 hr tablet Take 1 tablet (500 mg total) by mouth daily with breakfast.  . selenium 50 MCG TABS tablet Take 50 mcg by mouth daily.  . timolol (BETIMOL) 0.25 % ophthalmic solution Place 1 drop into both eyes daily.   . timolol (TIMOPTIC) 0.5 % ophthalmic solution Place 1 drop into both eyes daily.   . vitamin E 200 UNIT capsule Take 200 Units by mouth daily.  . [DISCONTINUED] atorvastatin (LIPITOR) 40 MG tablet Take 1 tablet (40 mg total) by mouth daily.   Facility-Administered Encounter Medications as of 10/14/2019  Medication  . sodium chloride flush (NS) 0.9 % injection 3 mL    Current Diagnosis/Assessment:  Goals Addressed            This Visit's Progress   . Chronic Care Management       CARE PLAN ENTRY  Current Barriers:  . Chronic Disease Management support, education, and care coordination needs related to Hypertension, Peripheral arterial disease, hyperlipidemia, type 2 diabetes, osteoarthritis  Clinical Goal(s): Over the next 90 days, patient will:  . Work with the care management team to address educational, disease management, and care coordination needs  . Begin or continue self health monitoring activities as directed today  . Call provider office for new or worsened signs and symptoms  . Call care management team with questions or concerns . Work towards quitting smoking  . Achieve A1c less  than 6.5%  . Achieve LDL (bad cholesterol) less than 70  . Maintain blood pressure less than 130/80   Interventions related to:  Marland Kitchen Recommend nicotine patch 21 mg/24hr daily for 6 weeks. Counseled on patch placement, side effects, and option to remove at night if they experience trouble  sleeping or bad dreams.  . Recommend nicotine lozenge 2 mg as needed for breakthrough cravings. Counseled to allow lozenge to dissolve and absorb in cheek pocket, rather than swallow. . Provided contact information for [place] Quit Line (1-800-QUIT-NOW). Patient will outreach this group for support.  Patient Self Care Activities:  . Patient will commit to reducing tobacco consumption and set a quit date of 11/18/19.  Telephone follow up appointment with pharmacy team member scheduled for: 11/25/19 at 9:30 AM       Diabetes   Recent Relevant Labs: Lab Results  Component Value Date/Time   HGBA1C 7.0 (H) 06/17/2019 11:14 AM   HGBA1C 6.2 11/30/2015 12:05 PM   MICROALBUR 1.5 11/30/2015 12:05 PM     Checking BG: Never  Recent FBG Readings: n/a Recent pre-meal BG readings: n/a Recent 2hr PP BG readings:  n/a Recent HS BG readings: n/a  Patient has failed these meds in past: n/a Patient is currently uncontrolled on the following medications:   Metformin XR 500 mg daily A1c goal <6.5%   Last diabetic Foot exam: No results found for: HMDIABEYEEXA  Last diabetic Eye exam: No results found for: HMDIABFOOTEX   We discussed: diet and exercise extensively. Loves to cook. Has been make conscious substitutions to eat healthier (butter -> ghee) (sour cream -> yogurt). Has been trying to eliminate bread. We discussed in detail the pathophysiology of diabetes, insulin and blood sugar. We also reviewed the role of metformin at improving insulin sensitivity and lowering blood sugars.    Plan  Continue current medications  Recommend A1c   Hypertension   Office blood pressures are  BP Readings from Last 3 Encounters:  10/08/19 (!) 152/70  09/05/19 138/87  08/27/19 128/62   CMP Latest Ref Rng & Units 08/12/2019 07/23/2019 06/17/2019  Glucose 70 - 99 mg/dL 125(H) 122(H) 143(H)  BUN 8 - 23 mg/dL 10 13 14   Creatinine 0.61 - 1.24 mg/dL 0.78 0.80 0.74  Sodium 135 - 145 mmol/L 139 138 135  Potassium  3.5 - 5.1 mmol/L 3.7 5.0 4.3  Chloride 98 - 111 mmol/L 107 100 101  CO2 22 - 32 mmol/L 24 23 26   Calcium 8.9 - 10.3 mg/dL 8.7(L) 9.8 9.5  Total Protein 6.1 - 8.1 g/dL - - -  Total Bilirubin 0.2 - 1.2 mg/dL - - -  Alkaline Phos 40 - 115 U/L - - -  AST 10 - 35 U/L - - -  ALT 9 - 46 U/L - - -    Patient has failed these meds in the past: n/a Patient is currently controlled on the following medications:   Lisinopril 10 mg daily   Patient checks BP at home daily  Patient home BP readings are ranging: [id number], [id number] systolics.   We discussed diet and exercise extensively. Walking more consistently (5000-6000 steps daily). Wants to get back into the gym.   Plan  Continue current medications.   Hyperlipidemia   Lipid Panel     Component Value Date/Time   CHOL 176 10/08/2019 1606   TRIG 118 10/08/2019 1606   HDL 41 10/08/2019 1606   CHOLHDL 4.3 10/08/2019 1606   CHOLHDL 7 06/17/2019 1114   VLDL 43.0 (H)  06/17/2019 1114   LDLCALC 114 (H) 10/08/2019 1606   LDLDIRECT 220.0 06/17/2019 1114   LABVLDL 21 10/08/2019 1606     The 10-year ASCVD risk score Mikey Bussing DC Jr., et al., 2013) is: 50.7%   Values used to calculate the score:     Age: 39 years     Sex: Male     Is Non-Hispanic African American: No     Diabetic: Yes     Tobacco smoker: Yes     Systolic Blood Pressure: 0000000 mmHg     Is BP treated: Yes     HDL Cholesterol: 41 mg/dL     Total Cholesterol: 176 mg/dL   Patient has failed these meds in past: pravastatin Patient is currently uncontrolled on the following medications:   Atorvastatin 80 mg daily   We discussed:  diet and exercise extensively. Patient tolerating dose increase well so far.   Plan  Continue current medications  Recommend fasting lipid panel ~11/12/19 to assess atorvastatin change.   PAD   Patient has failed these meds in past: n/a Patient is currently controlled on the following medications:   Clopidogrel 75 mg daily (6 month duration)   Aspirin  81 mg daily   We discussed: Denies blood in urine/stool. Denies symptoms of claudication.   Plan  Continue current medications   Tobacco Abuse   Tobacco Status:  Social History   Tobacco Use  Smoking Status Current Every Day Smoker  . Packs/day: 1.00  . Years: 25.00  . Pack years: 25.00  . Types: Cigarettes  Smokeless Tobacco Never Used  Tobacco Comment   quit 2012 after smoking on and off a little for year. Now current smoker    Patient smokes Within 30 minutes of waking Patient triggers include: stress, finishing a meal and Talking on the phone or relaxing. On a scale of 1-10, reports MOTIVATION to quit is 7 On a scale of 1-10, reports CONFIDENCE in quitting is 5  Patient has failed these meds in past: Nicotine 21 mg/24hr patch daily (takes off at night)  Patient is currently uncontrolled on the following medications: none. Patient felt the patches helped him stop his cravings to smoke, but they returned when he stopped using the patch.   We discussed:  Patient set quit date of 11/18/19. Counseled on patch placement, side effects, and option to remove at night if they experience trouble sleeping or bad dreams.  Counseled to allow lozenge to dissolve and absorb in cheek pocket, rather than swallow, to reduce GI side effects. Provided contact information for [place] Quit Line (1-800-QUIT-NOW) and encouraged patient to reach out to this group for support.     Plan  Recommend resuming nicotine 21 mg/24 hr patch daily for 6 weeks Recommend nicotine 2 mg lozenge as needed for breakthrough cravings.    Misc/OTC   Selenium 50 mcg daily- free radicals  Timolol 0.5% opth Vitamin E 200u daily -free radicals  Vitamin C 1000 mg daily -free radicals   Vaccines   Reviewed and discussed patient's vaccination history.    Immunization History  Administered Date(s) Administered  . Fluad Quad(high Dose 65+) 06/17/2019  . PFIZER SARS-COV-2 Vaccination 08/15/2019, 09/09/2019  .  Pneumococcal Conjugate-13 06/17/2019  . Tdap 07/14/2013  . Zoster Recombinat (Shingrix) 06/17/2019    Plan  Recommended patient receive 2nd dose of Shingrix vaccine  Medication Management   [facility]. Reviewed patient's [facility] medication and Epic medication profile assuring there are no discrepancies or gaps  in therapy. Confirmed all fill dates appropriate and verified with patient that there is a sufficient quantity of all prescribed medications at home. Informed patient to call me any time if needing medications before scheduled deliveries. The anticipated medication sync date is     Follow up: 6 weeks     Doristine Section [phone]

## 2019-11-24 ENCOUNTER — Other Ambulatory Visit: Payer: Self-pay

## 2019-11-24 MED ORDER — ROSUVASTATIN CALCIUM 40 MG PO TABS: 40.0000 mg | ORAL_TABLET | Freq: Every day | ORAL | 3 refills | Status: DC

## 2019-11-25 ENCOUNTER — Telehealth: Payer: Medicare Other

## 2019-11-25 NOTE — Chronic Care Management (AMB) (Deleted)
Chronic Care Management Pharmacy  Name: Adam Perkins  MRN: [id number] DOB: 1951-03-06  Chief Complaint/ HPI  Adam Perkins,  69 y.o. , male presents for their Follow-Up CCM visit with the clinical pharmacist via telephone due to COVID-19 Pandemic.  PCP : Adam Nian, DO  Their chronic conditions include: Hypertension, Peripheral arterial disease, hyperlipidemia, type 2 diabetes, osteoarthritis, tobacco abuse  Office Visits: 07/17/19: patient presented to Dr. Bryan Perkins for DM follow-up. A1c 7.0%, BP improved on lisinopril 118-145/56-78. No medication changes noted.   Consult Visits: 11/24/19: Patient noted to be having a sense of "general unease" since increasing atorvastatin. Dr. Audie Perkins switched patient to rosuvastatin 40 mg.  10/08/19: Patient presented to Dr. Fransico Perkins (Cardiology) for PAD follow-up. BP in clinic 152/70, LDL improved to 114. Atorvastatin increased to 80 mg daily.  09/05/19: Patient referred to Dr. Gardiner Perkins (podaitry) for onychomycosis of toenails of both feet. Debridement of nails as patient unable to reach his feet. Plan for follow-up in 3 months. 08/27/10: Patient presented to Dr. Quay Perkins for PAD follow-up. 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 Claudication improved. No medication changes noted. Follow-up in 6 months  08/11/19: Patient admitted for doppler +stenting for PAD. Discharged without complication on asa + clopidgrel.  07/31/19: Patient presented to Adam Perkins, RD for diabetes and nutrition education.  07/23/19: patient referred to Dr. Quay Perkins (cardiology) for PAD follow-up. Cardiac catheterization planned. Patient to continue aspirin 81 mg daily. 07/11/19: Patient presented to Dr. Fransico Perkins (Cardiology) for HTN follow-up. BP noted to be controlled, cholesterol severely elevated likely secondary to diabetes/diet. No medication changes noted.   Medications: Outpatient Encounter Medications as  of 11/25/2019  Medication Sig  . Ascorbic Acid (VITAMIN C) 1000 MG tablet Take 100 mg by mouth daily.  Marland Kitchen aspirin EC 81 MG tablet Take 81 mg by mouth daily.  . clopidogrel (PLAVIX) 75 MG tablet Take 1 tablet (75 mg total) by mouth daily with breakfast.  . lisinopril (ZESTRIL) 10 MG tablet Take 1 tablet (10 mg total) by mouth daily.  . metFORMIN (GLUCOPHAGE XR) 500 MG 24 hr tablet Take 1 tablet (500 mg total) by mouth daily with breakfast.  . rosuvastatin (CRESTOR) 40 MG tablet Take 1 tablet (40 mg total) by mouth daily.  Marland Kitchen selenium 50 MCG TABS tablet Take 50 mcg by mouth daily.  . timolol (BETIMOL) 0.25 % ophthalmic solution Place 1 drop into both eyes daily.   . timolol (TIMOPTIC) 0.5 % ophthalmic solution Place 1 drop into both eyes daily.   . vitamin E 200 UNIT capsule Take 200 Units by mouth daily.   Facility-Administered Encounter Medications as of 11/25/2019  Medication  . sodium chloride flush (NS) 0.9 % injection 3 mL    Current Diagnosis/Assessment:  Goals Addressed   None     Diabetes   Recent Relevant Labs: Lab Results  Component Value Date/Time   HGBA1C 7.0 (H) 06/17/2019 11:14 AM   HGBA1C 6.2 11/30/2015 12:05 PM   MICROALBUR 1.5 11/30/2015 12:05 PM     Checking BG: Never  Recent FBG Readings: n/a Recent pre-meal BG readings: n/a Recent 2hr PP BG readings:  n/a Recent HS BG readings: n/a  Patient has failed these meds in past: n/a Patient is currently uncontrolled on the following medications:   Metformin XR 500 mg daily A1c goal <6.5%   Last diabetic Foot exam: No results found for: HMDIABEYEEXA  Last diabetic Eye exam: No results found for:  HMDIABFOOTEX   We discussed: diet and exercise extensively. Loves to cook. Has been make conscious substitutions to eat healthier (butter -> ghee) (sour cream -> yogurt). Has been trying to eliminate bread. We discussed in detail the pathophysiology of diabetes, insulin and blood sugar. We also reviewed the role of  metformin at improving insulin sensitivity and lowering blood sugars.    Plan  Continue current medications  Recommend A1c   Hypertension   Office blood pressures are  BP Readings from Last 3 Encounters:  10/08/19 (!) 152/70  09/05/19 138/87  08/27/19 128/62   CMP Latest Ref Rng & Units 08/12/2019 07/23/2019 06/17/2019  Glucose 70 - 99 mg/dL 125(H) 122(H) 143(H)  BUN 8 - 23 mg/dL 10 13 14   Creatinine 0.61 - 1.24 mg/dL 0.78 0.80 0.74  Sodium 135 - 145 mmol/L 139 138 135  Potassium 3.5 - 5.1 mmol/L 3.7 5.0 4.3  Chloride 98 - 111 mmol/L 107 100 101  CO2 22 - 32 mmol/L 24 23 26   Calcium 8.9 - 10.3 mg/dL 8.7(L) 9.8 9.5  Total Protein 6.1 - 8.1 g/dL - - -  Total Bilirubin 0.2 - 1.2 mg/dL - - -  Alkaline Phos 40 - 115 U/L - - -  AST 10 - 35 U/L - - -  ALT 9 - 46 U/L - - -    Patient has failed these meds in the past: n/a Patient is currently controlled on the following medications:   Lisinopril 10 mg daily   Patient checks BP at home daily  Patient home BP readings are ranging: 124/58, 099I systolics.   We discussed diet and exercise extensively. Walking more consistently (5000-6000 steps daily). Wants to get back into the gym.   Plan  Continue current medications.   Hyperlipidemia   Lipid Panel     Component Value Date/Time   CHOL 176 10/08/2019 1606   TRIG 118 10/08/2019 1606   HDL 41 10/08/2019 1606   CHOLHDL 4.3 10/08/2019 1606   CHOLHDL 7 06/17/2019 1114   VLDL 43.0 (H) 06/17/2019 1114   LDLCALC 114 (H) 10/08/2019 1606   LDLDIRECT 220.0 06/17/2019 1114   LABVLDL 21 10/08/2019 1606     The 10-year ASCVD risk score Mikey Bussing DC Jr., et al., 2013) is: 50.7%   Values used to calculate the score:     Age: 10 years     Sex: Male     Is Non-Hispanic African American: No     Diabetic: Yes     Tobacco smoker: Yes     Systolic Blood Pressure: 338 mmHg     Is BP treated: Yes     HDL Cholesterol: 41 mg/dL     Total Cholesterol: 176 mg/dL   Patient has failed these  meds in past: pravastatin, atorvastatin 80 mg "uneasy" Patient is currently uncontrolled on the following medications:   Rosuvastatin 40 mg daily   We discussed:  diet and exercise extensively. Patient tolerating dose increase well so far.   Plan  Continue current medications  Recommend fasting lipid panel ~11/12/19 to assess atorvastatin change.   PAD   Patient has failed these meds in past: n/a Patient is currently controlled on the following medications:   Clopidogrel 75 mg daily (6 month duration)   Aspirin 81 mg daily   We discussed: Denies blood in urine/stool. Denies symptoms of claudication.   Plan  Continue current medications   Tobacco Abuse   Tobacco Status:  Social History   Tobacco Use  Smoking Status Current  Every Day Smoker  . Packs/day: 1.00  . Years: 25.00  . Pack years: 25.00  . Types: Cigarettes  Smokeless Tobacco Never Used  Tobacco Comment   quit 2012 after smoking on and off a little for year. Now current smoker    Patient smokes Within 30 minutes of waking Patient triggers include: stress, finishing a meal and Talking on the phone or relaxing. On a scale of 1-10, reports MOTIVATION to quit is 7 On a scale of 1-10, reports CONFIDENCE in quitting is 5  Patient has failed these meds in past: Nicotine 21 mg/24hr patch daily (takes off at night)  Patient is currently uncontrolled on the following medications: none. Patient felt the patches helped him stop his cravings to smoke, but they returned when he stopped using the patch.   We discussed:  Patient set quit date of 11/18/19. Counseled on patch placement, side effects, and option to remove at night if they experience trouble sleeping or bad dreams.  Counseled to allow lozenge to dissolve and absorb in cheek pocket, rather than swallow, to reduce GI side effects. Provided contact information for [place] [facility]uit [facility] (1-800-QUIT-NOW) and encouraged patient to reach out to this group for support.      Plan  Recommend resuming nicotine 21 mg/24 hr patch daily for 6 weeks Recommend nicotine 2 mg lozenge as needed for breakthrough cravings.    Misc/OTC   Selenium 50 mcg daily- free radicals  Timolol 0.5% opth Vitamin E 200u daily -free radicals  Vitamin C 1000 mg daily -free radicals   Vaccines   Reviewed and discussed patient's vaccination history.    Immunization History  Administered Date(s) Administered  . Fluad Quad(high Dose 65+) 06/17/2019  . PFIZER SARS-COV-2 Vaccination 08/15/2019, 09/09/2019  . Pneumococcal Conjugate-13 06/17/2019  . Tdap 07/14/2013  . Zoster Recombinat (Shingrix) 06/17/2019    Plan  Recommended patient receive 2nd dose of Shingrix vaccine  Medication Management   [facility] uses [facility] for all medications. Reviewed patient's [facility] medication and Epic medication profile assuring there are no discrepancies or gaps in therapy. Confirmed all fill dates appropriate and verified with patient that there is a sufficient quantity of all prescribed medications at home. Informed patient to call me any time if needing medications before scheduled deliveries. The anticipated medication sync date is 12/09/19     Follow up: 6 weeks     Doristine Section [phone]

## 2019-11-28 ENCOUNTER — Telehealth: Payer: Medicare Other

## 2019-12-01 ENCOUNTER — Telehealth: Payer: Medicare Other

## 2019-12-02 ENCOUNTER — Ambulatory Visit: Payer: Medicare Other

## 2019-12-02 DIAGNOSIS — E785 Hyperlipidemia, unspecified: Secondary | ICD-10-CM

## 2019-12-02 DIAGNOSIS — I10 Essential (primary) hypertension: Secondary | ICD-10-CM

## 2019-12-02 DIAGNOSIS — E1169 Type 2 diabetes mellitus with other specified complication: Secondary | ICD-10-CM

## 2019-12-02 NOTE — Chronic Care Management (AMB) (Signed)
Chronic Care Management Pharmacy  Name: Adam Perkins  MRN: [id number] DOB: 11-16-1950  Chief Complaint/ HPI  Adam Perkins,  69 y.o. , male presents for their Follow-Up CCM visit with the clinical pharmacist via telephone due to COVID-19 Pandemic.  PCP : Ronnald Nian, DO  Their chronic conditions include: Hypertension, Peripheral arterial disease, hyperlipidemia, type 2 diabetes, osteoarthritis, tobacco abuse  Office Visits: 07/17/19: patient presented to Dr. Bryan Lemma for DM follow-up. A1c 7.0%, BP improved on lisinopril 118-145/56-78. No medication changes noted.   Consult Visits: 11/24/19: Patient noted to be having a sense of "general unease" since increasing atorvastatin. Dr. Audie Box switched patient to rosuvastatin 40 mg.  10/08/19: Patient presented to Dr. Fransico Michael (Cardiology) for PAD follow-up. BP in clinic 152/70, LDL improved to 114. Atorvastatin increased to 80 mg daily.  09/05/19: Patient referred to Dr. Gardiner Barefoot (podaitry) for onychomycosis of toenails of both feet. Debridement of nails as patient unable to reach his feet. Plan for follow-up in 3 months. 08/27/10: Patient presented to Dr. Quay Burow for PAD follow-up. 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 Claudication improved. No medication changes noted. Follow-up in 6 months  08/11/19: Patient admitted for doppler +stenting for PAD. Discharged without complication on asa + clopidgrel.  07/31/19: Patient presented to Isidore Moos, RD for diabetes and nutrition education.  07/23/19: patient referred to Dr. Quay Burow (cardiology) for PAD follow-up. Cardiac catheterization planned. Patient to continue aspirin 81 mg daily. 07/11/19: Patient presented to Dr. Fransico Michael (Cardiology) for HTN follow-up. BP noted to be controlled, cholesterol severely elevated likely secondary to diabetes/diet. No medication changes noted.   Medications: Outpatient Encounter Medications as  of 12/02/2019  Medication Sig  . Ascorbic Acid (VITAMIN C) 1000 MG tablet Take 100 mg by mouth daily.  Marland Kitchen aspirin EC 81 MG tablet Take 81 mg by mouth daily.  . clopidogrel (PLAVIX) 75 MG tablet Take 1 tablet (75 mg total) by mouth daily with breakfast.  . lisinopril (ZESTRIL) 10 MG tablet Take 1 tablet (10 mg total) by mouth daily.  . metFORMIN (GLUCOPHAGE XR) 500 MG 24 hr tablet Take 1 tablet (500 mg total) by mouth daily with breakfast.  . rosuvastatin (CRESTOR) 40 MG tablet Take 1 tablet (40 mg total) by mouth daily.  Marland Kitchen selenium 50 MCG TABS tablet Take 50 mcg by mouth daily.  . timolol (BETIMOL) 0.25 % ophthalmic solution Place 1 drop into both eyes daily.   . timolol (TIMOPTIC) 0.5 % ophthalmic solution Place 1 drop into both eyes daily.   . vitamin E 200 UNIT capsule Take 200 Units by mouth daily.   Facility-Administered Encounter Medications as of 12/02/2019  Medication  . sodium chloride flush (NS) 0.9 % injection 3 mL    Current Diagnosis/Assessment:  Goals Addressed            This Visit's Progress   . Chronic Care Management       CARE PLAN ENTRY  Current Barriers:  . Chronic Disease Management support, education, and care coordination needs related to Hypertension, Hyperlipidemia, Diabetes, Tobacco use, and Peripheral artery disease   Hypertension BP Readings from Last 3 Encounters:  10/08/19 (!) 152/70  09/05/19 138/87  08/27/19 128/62   . Pharmacist Clinical Goal(s): o Over the next 90 days, patient will work with PharmD and providers to maintain BP goal <140/90 . Current regimen:  o Lisinopril 10 mg daily . Patient self care activities - Over the next 90 days,  patient will: o Check blood pressure at least weekly, document, and provide at future appointments o Ensure daily salt intake < 2300 mg/day  Hyperlipidemia Lab Results  Component Value Date/Time   LDLCALC 114 (H) 10/08/2019 04:06 PM   LDLDIRECT 220.0 06/17/2019 11:14 AM   . Pharmacist Clinical  Goal(s): o Over the next 90 days, patient will work with PharmD and providers to achieve LDL goal < 70 . Current regimen:  o Rosuvastatin 40 mg daily  . Interventions: o Recommend fasting lipid panel to re-assess cholesterol.  Diabetes Lab Results  Component Value Date/Time   HGBA1C 7.0 (H) 06/17/2019 11:14 AM   HGBA1C 6.2 11/30/2015 12:05 PM   . Pharmacist Clinical Goal(s): o Over the next 90 days, patient will work with PharmD and providers to achieve A1c goal <6.5% . Current regimen:  o Metformin XR 500 mg daily  . Interventions: o Recommend rechecking A1c to re-assess diabetes control . Patient self care activities - Over the next 90 days, patient will: o Check blood sugar if symptomatic, document, and provide at future appointments o Contact provider with any episodes of hypoglycemia  Medication management . Pharmacist Clinical Goal(s): o Over the next 90 days, patient will work with PharmD and providers to maintain optimal medication adherence . Current pharmacy: [facility] . Interventions o Comprehensive medication review performed. o Utilize [facility] pharmacy for medication synchronization, packaging and delivery . Patient self care activities - Over the next 90 days, patient will: o Take medications as prescribed o Report any questions or concerns to PharmD and/or provider(s)       Diabetes   Recent Relevant Labs: Lab Results  Component Value Date/Time   HGBA1C 7.0 (H) 06/17/2019 11:14 AM   HGBA1C 6.2 11/30/2015 12:05 PM   MICROALBUR 1.5 11/30/2015 12:05 PM     Checking BG: Never  Recent FBG Readings: n/a Recent pre-meal BG readings: n/a Recent 2hr PP BG readings:  n/a Recent HS BG readings: n/a  Patient has failed these meds in past: n/a Patient is currently uncontrolled on the following medications:   Metformin XR 500 mg daily A1c goal <6.5%   Last diabetic Foot exam: No results found for: HMDIABEYEEXA  Last diabetic Eye exam: No results  found for: HMDIABFOOTEX   We discussed: diet and exercise extensively. Loves to cook. Has been make conscious substitutions to eat healthier (butter -> ghee) (sour cream -> yogurt). Has been trying to eliminate bread. We discussed in detail the pathophysiology of diabetes, insulin and blood sugar. We also reviewed the role of metformin at improving insulin sensitivity and lowering blood sugars.    Plan  Continue current medications  Recommend A1c (ordered)  Hypertension   Office blood pressures are  BP Readings from Last 3 Encounters:  10/08/19 (!) 152/70  09/05/19 138/87  08/27/19 128/62   CMP Latest Ref Rng & Units 08/12/2019 07/23/2019 06/17/2019  Glucose 70 - 99 mg/dL 125(H) 122(H) 143(H)  BUN 8 - 23 mg/dL 10 13 14   Creatinine 0.61 - 1.24 mg/dL 0.78 0.80 0.74  Sodium 135 - 145 mmol/L 139 138 135  Potassium 3.5 - 5.1 mmol/L 3.7 5.0 4.3  Chloride 98 - 111 mmol/L 107 100 101  CO2 22 - 32 mmol/L 24 23 26   Calcium 8.9 - 10.3 mg/dL 8.7(L) 9.8 9.5  Total Protein 6.1 - 8.1 g/dL - - -  Total Bilirubin 0.2 - 1.2 mg/dL - - -  Alkaline Phos 40 - 115 U/L - - -  AST 10 - 35  U/L - - -  ALT 9 - 46 U/L - - -    Patient has failed these meds in the past: n/a Patient is currently controlled on the following medications:   Lisinopril 10 mg daily   Patient checks BP at home daily  Patient home BP readings are ranging: 202/54, 270W systolics.   We discussed diet and exercise extensively. Walking more consistently (5000-6000 steps daily). Wants to get back into the gym.   Plan  Continue current medications.   Hyperlipidemia   Lipid Panel     Component Value Date/Time   CHOL 176 10/08/2019 1606   TRIG 118 10/08/2019 1606   HDL 41 10/08/2019 1606   CHOLHDL 4.3 10/08/2019 1606   CHOLHDL 7 06/17/2019 1114   VLDL 43.0 (H) 06/17/2019 1114   LDLCALC 114 (H) 10/08/2019 1606   LDLDIRECT 220.0 06/17/2019 1114   LABVLDL 21 10/08/2019 1606     The 10-year ASCVD risk score Mikey Bussing DC Jr.,  et al., 2013) is: 50.7%   Values used to calculate the score:     Age: 4 years     Sex: Male     Is Non-Hispanic African American: No     Diabetic: Yes     Tobacco smoker: Yes     Systolic Blood Pressure: 237 mmHg     Is BP treated: Yes     HDL Cholesterol: 41 mg/dL     Total Cholesterol: 176 mg/dL   Patient has failed these meds in past: pravastatin, atorvastatin 80 mg "uneasy" Patient is currently uncontrolled on the following medications:   Rosuvastatin 40 mg daily   We discussed:  diet and exercise extensively. Patient states feeling better following switch from atorvastatin to rosuvastatin.   Plan  Continue current medications  Recommend fasting lipid panel (ordered)  PAD   Patient has failed these meds in past: n/a Patient is currently controlled on the following medications:   Clopidogrel 75 mg daily (6 month duration)   Aspirin 81 mg daily   We discussed: Denies blood in urine/stool. Denies symptoms of claudication.   Plan  Continue current medications   Tobacco Abuse   Tobacco Status:  Social History   Tobacco Use  Smoking Status Current Every Day Smoker  . Packs/day: 1.00  . Years: 25.00  . Pack years: 25.00  . Types: Cigarettes  Smokeless Tobacco Never Used  Tobacco Comment   quit 2012 after smoking on and off a little for year. Now current smoker    Patient smokes Within 30 minutes of waking Patient triggers include: stress, finishing a meal and Talking on the phone or relaxing. On a scale of 1-10, reports MOTIVATION to quit is 1 On a scale of 1-10, reports CONFIDENCE in quitting is 1  Patient has failed these meds in past: Nicotine 21 mg/24hr patch daily (takes off at night)  Patient is currently uncontrolled on the following medications: none.   We discussed:  Patient did not meet previously set quit date of 11/18/19, stating he has been very busy with travelling to [place] and [place] and has not tried to quit again. Patient not interested in  further counseling or support at this time, stating he has to "buckle down and do it myself." Counseled him on the importance of quitting smoking and encouraged patient to reach out to me for support as needed for smoking cessation.    Plan  Recommend resuming nicotine 21 mg/24 hr patch daily for 6 weeks Recommend nicotine 2 mg lozenge as  needed for breakthrough cravings.    Misc/OTC   Selenium 50 mcg daily- free radicals  Timolol 0.5% opth Vitamin E 200u daily -free radicals  Vitamin C 1000 mg daily -free radicals   Vaccines   Reviewed and discussed patient's vaccination history.    Immunization History  Administered Date(s) Administered  . Fluad Quad(high Dose 65+) 06/17/2019  . PFIZER SARS-COV-2 Vaccination 08/15/2019, 09/09/2019  . Pneumococcal Conjugate-13 06/17/2019  . Tdap 07/14/2013  . Zoster Recombinat (Shingrix) 06/17/2019    Plan  Recommended patient receive 2nd dose of Shingrix vaccine  Medication Management   Pt uses [facility] for all medications.   Reviewed chart for medication changes ahead of medication coordination call.  11/24/19: Atorvastatin 80 mg stopped, Rosuvastatin 40 mg started  BP Readings from Last 3 Encounters:  10/08/19 (!) 152/70  09/05/19 138/87  08/27/19 128/62    Lab Results  Component Value Date   HGBA1C 7.0 (H) 06/17/2019     Patient obtains medications through Vials  90 Days   Last adherence delivery included: n/a  Patient is due for next adherence delivery on: 12/05/19. Called patient and reviewed medications and coordinated delivery.  This delivery to include:  Rosuvastatin 40 mg - BREAKFAST  Clopidogrel 75 mg - BREAKFAST  Metformin ER 500 mg - BREAKFAST  Lisinopril 10 mg - BREAKFAST  Patient declined the following medications (meds) due to (reason)  Vitamin C 1000 mg (Adequate supply-OTC)  Aspirin 81 mg (Adequate supply-OTC)  Selenium (Adequate supply-OTC)  Timolol (Adequate supply-OTC)  Vitamin E 200 units  (Adequate supply-OTC)  Confirmed delivery date of 12/05/19, advised patient that pharmacy will contact them the morning of delivery.  Follow up: 3 months  [place] at [facility]  [phone]

## 2019-12-02 NOTE — Patient Instructions (Signed)
Visit Information  Goals Addressed            This Visit's Progress   . Chronic Care Management       CARE PLAN ENTRY  Current Barriers:  . Chronic Disease Management support, education, and care coordination needs related to Hypertension, Hyperlipidemia, Diabetes, Tobacco use, and Peripheral artery disease   Hypertension BP Readings from Last 3 Encounters:  10/08/19 (!) 152/70  09/05/19 138/87  08/27/19 128/62   . Pharmacist Clinical Goal(s): o Over the next 90 days, patient will work with PharmD and providers to maintain BP goal <140/90 . Current regimen:  o Lisinopril 10 mg daily . Patient self care activities - Over the next 90 days, patient will: o Check blood pressure at least weekly, document, and provide at future appointments o Ensure daily salt intake < 2300 mg/day  Hyperlipidemia Lab Results  Component Value Date/Time   LDLCALC 114 (H) 10/08/2019 04:06 PM   LDLDIRECT 220.0 06/17/2019 11:14 AM   . Pharmacist Clinical Goal(s): o Over the next 90 days, patient will work with PharmD and providers to achieve LDL goal < 70 . Current regimen:  o Rosuvastatin 40 mg daily  . Interventions: o Recommend fasting lipid panel to re-assess cholesterol.  Diabetes Lab Results  Component Value Date/Time   HGBA1C 7.0 (H) 06/17/2019 11:14 AM   HGBA1C 6.2 11/30/2015 12:05 PM   . Pharmacist Clinical Goal(s): o Over the next 90 days, patient will work with PharmD and providers to achieve A1c goal <6.5% . Current regimen:  o Metformin XR 500 mg daily  . Interventions: o Recommend rechecking A1c to re-assess diabetes control . Patient self care activities - Over the next 90 days, patient will: o Check blood sugar if symptomatic, document, and provide at future appointments o Contact provider with any episodes of hypoglycemia  Medication management . Pharmacist Clinical Goal(s): o Over the next 90 days, patient will work with PharmD and providers to maintain optimal  medication adherence . Current pharmacy: [facility] . Interventions o Comprehensive medication review performed. o Utilize [facility] pharmacy for medication synchronization, packaging and delivery . Patient self care activities - Over the next 90 days, patient will: o Take medications as prescribed o Report any questions or concerns to PharmD and/or provider(s)       The patient verbalized understanding of instructions provided today and agreed to receive a mailed copy of patient instruction and/or educational materials.  Telephone follow up appointment with pharmacy team member scheduled for: 02/27/20 at 1:00 PM   [place] at [facility]  [phone]

## 2019-12-08 DIAGNOSIS — C44519 Basal cell carcinoma of skin of other part of trunk: Secondary | ICD-10-CM | POA: Diagnosis not present

## 2019-12-08 DIAGNOSIS — L988 Other specified disorders of the skin and subcutaneous tissue: Secondary | ICD-10-CM | POA: Diagnosis not present

## 2019-12-29 ENCOUNTER — Ambulatory Visit: Payer: Self-pay

## 2019-12-29 DIAGNOSIS — E119 Type 2 diabetes mellitus without complications: Secondary | ICD-10-CM

## 2019-12-29 DIAGNOSIS — I10 Essential (primary) hypertension: Secondary | ICD-10-CM

## 2019-12-29 NOTE — Chronic Care Management (AMB) (Signed)
Reviewed chart for medication changes ahead of medication coordination call.  No OVs, Consults, or hospital visits since last care coordination call/Pharmacist visit. No medication changes indicated.  BP Readings from Last 3 Encounters:  10/08/19 (!) 152/70  09/05/19 138/87  08/27/19 128/62    Lab Results  Component Value Date   HGBA1C 7.0 (H) 06/17/2019     Patient not checking blood sugars at home, but patient states he had an "event" on 12/27/19 that felt somewhat similar to an event he experienced in Dec 2020 where he was hospitalized. He thinks it might have been hypoglycemia. He woke up after a night of not eating much and drinking feeling fatigue, nausea, sweating. It improved after eating some food.  Patient reports a blood pressure of 162/85 with a fitbit.   Patient obtains medications through [facility]  90 Days   Last adherence delivery included:   Rosuvastatin 40 mg daily - BREAKFAST             Clopidogrel 75 mg daily - BREAKFAST             Metformin ER 500 mg daily- BREAKFAST         Lisinopril 10 mg daily - BREAKFAST  Called patient and reviewed medications and coordinated delivery.  No fill needed this month, patient has adequate supply of prescription and OTC medications.   Patient declined the following medications (meds) due to (reason)  Vitamin C 1000 mg (Adequate supply-OTC)         Aspirin 81 mg (Adequate supply-OTC)         Selenium (Adequate supply-OTC)         Timolol (Adequate supply-OTC)         Vitamin E 200 units (Adequate supply-OTC)   Plan: Instructed patient to check blood pressures daily for one week and to schedule follow-up with Dr. Bryan Lemma to recheck labwork and discuss recent event in more detail.   [place] [facility]  [phone]

## 2020-01-05 DIAGNOSIS — H2513 Age-related nuclear cataract, bilateral: Secondary | ICD-10-CM | POA: Diagnosis not present

## 2020-01-05 DIAGNOSIS — H00021 Hordeolum internum right upper eyelid: Secondary | ICD-10-CM | POA: Diagnosis not present

## 2020-01-05 DIAGNOSIS — H40053 Ocular hypertension, bilateral: Secondary | ICD-10-CM | POA: Diagnosis not present

## 2020-01-08 MED FILL — DOXYCYCLINE HYCLATE 100 MG: 100 | 7 days supply | Qty: 14 | Fill #0

## 2020-01-13 ENCOUNTER — Other Ambulatory Visit: Payer: Self-pay

## 2020-01-14 ENCOUNTER — Ambulatory Visit (INDEPENDENT_AMBULATORY_CARE_PROVIDER_SITE_OTHER): Payer: Medicare Other | Admitting: Family Medicine

## 2020-01-14 ENCOUNTER — Encounter: Payer: Self-pay | Admitting: Family Medicine

## 2020-01-14 VITALS — BP 136/78 | HR 61 | Temp 97.5°F | Ht 69.0 in | Wt 180.6 lb

## 2020-01-14 DIAGNOSIS — I10 Essential (primary) hypertension: Secondary | ICD-10-CM | POA: Diagnosis not present

## 2020-01-14 DIAGNOSIS — E1169 Type 2 diabetes mellitus with other specified complication: Secondary | ICD-10-CM | POA: Diagnosis not present

## 2020-01-14 DIAGNOSIS — E119 Type 2 diabetes mellitus without complications: Secondary | ICD-10-CM

## 2020-01-14 DIAGNOSIS — I739 Peripheral vascular disease, unspecified: Secondary | ICD-10-CM

## 2020-01-14 DIAGNOSIS — E785 Hyperlipidemia, unspecified: Secondary | ICD-10-CM | POA: Diagnosis not present

## 2020-01-14 LAB — HEMOGLOBIN A1C: Hgb A1c MFr Bld: 6.3 % (ref 4.6–6.5)

## 2020-01-14 MED ORDER — LISINOPRIL 20 MG PO TABS: 20.0000 mg | ORAL_TABLET | Freq: Every day | ORAL | 3 refills | Status: DC

## 2020-01-14 NOTE — Progress Notes (Signed)
Adam Perkins is a 69 y.o. male  Chief Complaint  Patient presents with  . Follow-up    6 month follow up for medications    HPI: Adam Perkins is a 69 y.o. male here for routine f/u on chronic medical conditions including HTN, hyperlipidemia, DM, PAD.  He saw cardio Dr. Audie Box last in 09/2019. About 2 weeks ago, his lisinopril was increased from 10mg  to 20mg  daily due to elevated BP readings and pt feeling fatigued and "off".  He was in [place] and had an episode of sweating, lightheadedness similar to the episode he had in 05/2019. His home BPs per patient have been 120's/70-80.  He is still smoking. He notes increased stress.  He denies CP, SOB, LE edema.  Lab Results  Component Value Date   HGBA1C 7.0 (H) 06/17/2019   Lab Results  Component Value Date   CHOL 176 10/08/2019   HDL 41 10/08/2019   LDLCALC 114 (H) 10/08/2019   LDLDIRECT 220.0 06/17/2019   TRIG 118 10/08/2019   CHOLHDL 4.3 10/08/2019    Past Medical History:  Diagnosis Date  . Arthritis   . Avascular necrosis of bone of right hip ([place]) 05/28/2014  . Chicken pox   . Diabetes mellitus without complication ([place])   . Elevated blood pressure 08/19/2012  . Hyperglycemia 12/30/2015  . Hyperlipemia 08/19/2012  . Hypertension   . Kidney stone    nephrolithiasis  . Pain in limb 10/20/2013  . 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 ABDOMINAL AORTOGRAM W/LOWER EXTREMITY N/A 08/11/2019   Procedure: ABDOMINAL AORTOGRAM W/LOWER EXTREMITY;  Surgeon: Lorretta Harp, MD;  Location: [facility];  Service: Cardiovascular;  Laterality: N/A;  . CHEILECTOMY Right 04/03/2019   Procedure: RIGHT DORSAL CHEILECTOMY;  Surgeon: Erle Crocker, MD;  Location: [facility];  Service: Orthopedics;  Laterality: Right;  SURGERY REQUEST TIME 90 MIN  . PERIPHERAL VASCULAR INTERVENTION Right 08/11/2019   Procedure: PERIPHERAL VASCULAR  INTERVENTION;  Surgeon: Lorretta Harp, MD;  Location: [facility];  Service: Cardiovascular;  Laterality: Right;  Iliac  . TOTAL HIP ARTHROPLASTY Right 05/28/2014   Procedure: RIGHT TOTAL HIP ARTHROPLASTY ANTERIOR APPROACH;  Surgeon: Mcarthur Rossetti, MD;  Location: [facility];  Service: Orthopedics;  Laterality: Right;    Social History   Socioeconomic History  . Marital status: Married    Spouse name: Not on file  . Number of children: 2  . Years of education: Not on file  . Highest education level: Not on file  Occupational History  . Not on file  Tobacco Use  . Smoking status: Current Every Day Smoker    Packs/day: 1.00    Years: 25.00    Pack years: 25.00    Types: Cigarettes  . Smokeless tobacco: Never Used  . Tobacco comment: quit 2012 after smoking on and off a little for year. Now current smoker  Substance and Sexual Activity  . Alcohol use: Yes    Alcohol/week: 0.0 standard drinks    Comment: 2 glasses of wine daily   . Drug use: No  . Sexual activity: Not Currently  Other Topics Concern  . Not on file  Social History Narrative  . Not on file   Social Determinants of Health   Financial Resource Strain:   . Difficulty of Paying Living Expenses:   Food Insecurity:   . Worried About [employer] of  Food in the Last Year:   . [place] in the Last Year:   Transportation Needs:   . Film/video editor (Medical):   Marland Kitchen Lack of Transportation (Non-Medical):   Physical Activity:   . Days of Exercise per Week:   . Minutes of Exercise per Session:   Stress:   . Feeling of Stress :   Social Connections:   . Frequency of Communication with Friends and Family:   . Frequency of Social Gatherings with Friends and Family:   . Attends Religious Services:   . Active Member of Clubs or Organizations:   . Attends Archivist Meetings:   Marland Kitchen Marital Status:   Intimate Partner Violence:   . Fear of Current or Ex-Partner:   . Emotionally Abused:    Marland Kitchen Physically Abused:   . Sexually Abused:     Family History  Problem Relation Age of Onset  . Arthritis Father   . Cancer Father 41       bladder cancer  . Diabetes Mother      Immunization History  Administered Date(s) Administered  . Fluad Quad(high Dose 65+) 06/17/2019  . PFIZER SARS-COV-2 Vaccination 08/15/2019, 09/09/2019  . Pneumococcal Conjugate-13 06/17/2019  . Tdap 07/14/2013  . Zoster Recombinat (Shingrix) 06/17/2019    Outpatient Encounter Medications as of 01/14/2020  Medication Sig  . Ascorbic Acid (VITAMIN C) 1000 MG tablet Take 100 mg by mouth daily.  Marland Kitchen aspirin EC 81 MG tablet Take 81 mg by mouth daily.  . clopidogrel (PLAVIX) 75 MG tablet Take 1 tablet (75 mg total) by mouth daily with breakfast.  . doxycycline (VIBRA-TABS) 100 MG tablet Take 100 mg by mouth 2 (two) times daily.  Marland Kitchen lisinopril (ZESTRIL) 10 MG tablet Take 1 tablet (10 mg total) by mouth daily. (Patient taking differently: Take 10 mg by mouth daily. Pt taking 2 a day.)  . metFORMIN (GLUCOPHAGE XR) 500 MG 24 hr tablet Take 1 tablet (500 mg total) by mouth daily with breakfast.  . rosuvastatin (CRESTOR) 40 MG tablet Take 1 tablet (40 mg total) by mouth daily.  Marland Kitchen selenium 50 MCG TABS tablet Take 50 mcg by mouth daily.  . timolol (BETIMOL) 0.25 % ophthalmic solution Place 1 drop into both eyes daily.   . timolol (TIMOPTIC) 0.5 % ophthalmic solution Place 1 drop into both eyes daily.   . vitamin E 200 UNIT capsule Take 200 Units by mouth daily.   Facility-Administered Encounter Medications as of 01/14/2020  Medication  . sodium chloride flush (NS) 0.9 % injection 3 mL     ROS: Pertinent positives and negatives noted in HPI. Remainder of ROS non-contributory    No Known Allergies  BP (!) 136/78   Pulse 61   Temp (!) 97.5 F (36.4 C) (Temporal)   Ht 5\' 9"  (1.753 m)   Wt 180 lb 9.6 oz (81.9 kg)   SpO2 98%   BMI 26.67 kg/m    BP Readings from Last 3 Encounters:  01/14/20 (!) 136/78    10/08/19 (!) 152/70  09/05/19 138/87   Pulse Readings from Last 3 Encounters:  01/14/20 61  10/08/19 65  09/05/19 (!) 59   Wt Readings from Last 3 Encounters:  01/14/20 180 lb 9.6 oz (81.9 kg)  10/08/19 186 lb (84.4 kg)  08/27/19 185 lb (83.9 kg)    Physical Exam Constitutional:      General: He is not in acute distress.    Appearance: Normal appearance. He is not toxic-appearing.  Cardiovascular:     Rate and Rhythm: Normal rate and regular rhythm.     Pulses: Normal pulses.  Pulmonary:     Effort: Pulmonary effort is normal.     Breath sounds: Normal breath sounds.  Musculoskeletal:     Right lower leg: No edema.     Left lower leg: No edema.  Neurological:     Mental Status: He is alert and oriented to person, place, and time.  Psychiatric:        Mood and Affect: Mood normal.        Behavior: Behavior normal.        A/P:  1. Essential hypertension - controlled, at goal - cont current meds, will send refill for lisinopril 20mg  tabs to pharmacy  2. Hyperlipidemia associated with type 2 diabetes mellitus ([place]) - FLP done in 09/2019 - cont crestor 40mg  daily   3. Controlled type 2 diabetes mellitus without complication, without long-term current use of insulin ([facility]) - Hemoglobin A1c - currently taking metformin XR 500mg  daily - cont ASA, lisinopril, crestor - f/u in 3 mo  4. Peripheral arterial disease ([place]) - no claudication symptoms, stable    This visit occurred during the SARS-CoV-2 public health emergency.  Safety protocols were in place, including screening questions prior to the visit, additional usage of staff PPE, and extensive cleaning of exam room while observing appropriate contact time as indicated for disinfecting solutions.

## 2020-01-27 ENCOUNTER — Telehealth: Payer: Self-pay

## 2020-01-27 DIAGNOSIS — E119 Type 2 diabetes mellitus without complications: Secondary | ICD-10-CM

## 2020-01-27 DIAGNOSIS — I10 Essential (primary) hypertension: Secondary | ICD-10-CM

## 2020-01-27 NOTE — Progress Notes (Signed)
Chronic Care Management Pharmacy Assistant   Name: Adam Perkins  MRN: [id number] DOB: Oct 25, 1950  Reason for Encounter: Medication Review  Patient Questions:  1.  Have you seen any other providers since your last visit? Yes, 01/14/2020 PCP [facility] Mary K   2.  Any changes in your medicines or health? Yes, 01/01/2020 Lisinopril take 2 tablets (20 mg ) total. Cardiology  [place] , Cassie Freer    PCP : Ronnald Nian, DO  Allergies:  No Known Allergies  Medications: Outpatient Encounter Medications as of 01/27/2020  Medication Sig   Ascorbic Acid (VITAMIN C) 1000 MG tablet Take 100 mg by mouth daily.   aspirin EC 81 MG tablet Take 81 mg by mouth daily.   clopidogrel (PLAVIX) 75 MG tablet Take 1 tablet (75 mg total) by mouth daily with breakfast.   doxycycline (VIBRA-TABS) 100 MG tablet Take 100 mg by mouth 2 (two) times daily.   lisinopril (ZESTRIL) 20 MG tablet Take 1 tablet (20 mg total) by mouth daily.   metFORMIN (GLUCOPHAGE XR) 500 MG 24 hr tablet Take 1 tablet (500 mg total) by mouth daily with breakfast.   rosuvastatin (CRESTOR) 40 MG tablet Take 1 tablet (40 mg total) by mouth daily.   selenium 50 MCG TABS tablet Take 50 mcg by mouth daily.   timolol (BETIMOL) 0.25 % ophthalmic solution Place 1 drop into both eyes daily.    timolol (TIMOPTIC) 0.5 % ophthalmic solution Place 1 drop into both eyes daily.    vitamin E 200 UNIT capsule Take 200 Units by mouth daily.   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   OVs, Consults, or hospital visits since last care coordination call/Pharmacist visit.   Yes, 01/01/2020 Lisinopril take 2 tablets (20 mg ) total. Cardiology  Geralynn Rile,  01/14/2020 PCP Letta Median   BP Readings from Last 3 Encounters:  01/14/20 (!) 136/78  10/08/19 (!) 152/70  09/05/19 138/87    Lab Results  Component Value Date   HGBA1C 6.3 01/14/2020     Patient obtains medications through Adherence Packaging  30 Days   Last adherence delivery included: (medication name and frequency)   Rosuvastatin 40 mg daily - BREAKFAST Clopidogrel 75 mg daily -BREAKFAST Metformin ER 500 mg daily-BREAKFAST Lisinopril 10 mg daily - BREAKFAST Patient declined (meds) last month due to PRN use/additional supply on hand.  Vitamin C 1000 mg (Adequate supply-OTC) Aspirin 81 mg (Adequate supply-OTC) Selenium (Adequate supply-OTC) Timolol (Adequate supply-OTC) Vitamin E 200 units (Adequate supply-OTC)  Called patient and reviewed medications and coordinated delivery.  This delivery to include:None ID    Patient needs refills for None ID .   [place] Pharmacist Assistant [phone]

## 2020-01-27 NOTE — Progress Notes (Deleted)
Subjective:   Adam Perkins is a 69 y.o. male who presents for an Initial Medicare Annual Wellness Visit.  I connected with Adam Perkins today by telephone and verified that I am speaking with the correct person using two identifiers. Location patient: home Location provider: work Persons participating in the virtual visit: patient, Marine scientist.    I discussed the limitations, risks, security and privacy concerns of performing an evaluation and management service by telephone and the availability of in person appointments. I also discussed with the patient that there may be a patient responsible charge related to this service. The patient expressed understanding and verbally consented to this telephonic visit.    Interactive audio and video telecommunications were attempted between this provider and patient, however failed, due to patient having technical difficulties OR patient did not have access to video capability.  We continued and completed visit with audio only.  Some vital signs may be absent or patient reported.   Time Spent with patient on telephone encounter: *** minutes  Review of Systems        Objective:    There were no vitals filed for this visit. There is no height or weight on file to calculate BMI.  Advanced Directives 08/11/2019 08/11/2019 07/31/2019 04/03/2019 03/28/2019 10/31/2015 05/28/2014  Does Patient Have a Medical Advance Directive? Yes - Yes Yes Yes No Yes  Type of Paramedic of [place];Living will - - - [place];Living will - [place];Living will  Does patient want to make changes to medical advance directive? No - Patient declined No - Patient declined No - Patient declined - No - Patient declined - No - Patient declined  Copy of [place] in Chart? No - copy requested - - - - - No - copy requested  Would patient like information on creating a medical advance directive? - - - - - No -  patient declined information -    Current Medications (verified) Outpatient Encounter Medications as of 01/28/2020  Medication Sig  . Ascorbic Acid (VITAMIN C) 1000 MG tablet Take 100 mg by mouth daily.  Marland Kitchen aspirin EC 81 MG tablet Take 81 mg by mouth daily.  . clopidogrel (PLAVIX) 75 MG tablet Take 1 tablet (75 mg total) by mouth daily with breakfast.  . doxycycline (VIBRA-TABS) 100 MG tablet Take 100 mg by mouth 2 (two) times daily.  Marland Kitchen lisinopril (ZESTRIL) 20 MG tablet Take 1 tablet (20 mg total) by mouth daily.  . metFORMIN (GLUCOPHAGE XR) 500 MG 24 hr tablet Take 1 tablet (500 mg total) by mouth daily with breakfast.  . rosuvastatin (CRESTOR) 40 MG tablet Take 1 tablet (40 mg total) by mouth daily.  Marland Kitchen selenium 50 MCG TABS tablet Take 50 mcg by mouth daily.  . timolol (BETIMOL) 0.25 % ophthalmic solution Place 1 drop into both eyes daily.   . timolol (TIMOPTIC) 0.5 % ophthalmic solution Place 1 drop into both eyes daily.   . vitamin E 200 UNIT capsule Take 200 Units by mouth daily.   Facility-Administered Encounter Medications as of 01/28/2020  Medication  . sodium chloride flush (NS) 0.9 % injection 3 mL    Allergies (verified) Patient has no known allergies.   History: Past Medical History:  Diagnosis Date  . Arthritis   . Avascular necrosis of bone of right hip ([place]) 05/28/2014  . Chicken pox   . Diabetes mellitus without complication ([place])   . Elevated blood pressure 08/19/2012  .  Hyperglycemia 12/30/2015  . Hyperlipemia 08/19/2012  . Hypertension   . Kidney stone    nephrolithiasis  . Pain in limb 10/20/2013  . 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 ABDOMINAL AORTOGRAM W/LOWER EXTREMITY N/A 08/11/2019   Procedure: ABDOMINAL AORTOGRAM W/LOWER EXTREMITY;  Surgeon: Lorretta Harp, MD;  Location: [facility];  Service: Cardiovascular;  Laterality: N/A;  . CHEILECTOMY Right 04/03/2019   Procedure:  RIGHT DORSAL CHEILECTOMY;  Surgeon: Erle Crocker, MD;  Location: [facility];  Service: Orthopedics;  Laterality: Right;  SURGERY REQUEST TIME 90 MIN  . PERIPHERAL VASCULAR INTERVENTION Right 08/11/2019   Procedure: PERIPHERAL VASCULAR INTERVENTION;  Surgeon: Lorretta Harp, MD;  Location: [facility];  Service: Cardiovascular;  Laterality: Right;  Iliac  . TOTAL HIP ARTHROPLASTY Right 05/28/2014   Procedure: RIGHT TOTAL HIP ARTHROPLASTY ANTERIOR APPROACH;  Surgeon: Mcarthur Rossetti, MD;  Location: [facility];  Service: Orthopedics;  Laterality: Right;   Family History  Problem Relation Age of Onset  . Arthritis Father   . Cancer Father 74       bladder cancer  . Diabetes Mother    Social History   Socioeconomic History  . Marital status: Married    Spouse name: Not on file  . Number of children: 2  . Years of education: Not on file  . Highest education level: Not on file  Occupational History  . Not on file  Tobacco Use  . Smoking status: Current Every Day Smoker    Packs/day: 1.00    Years: 25.00    Pack years: 25.00    Types: Cigarettes  . Smokeless tobacco: Never Used  . Tobacco comment: quit 2012 after smoking on and off a little for year. Now current smoker  Substance and Sexual Activity  . Alcohol use: Yes    Alcohol/week: 0.0 standard drinks    Comment: 2 glasses of wine daily   . Drug use: No  . Sexual activity: Not Currently  Other Topics Concern  . Not on file  Social History Narrative  . Not on file   Social Determinants of Health   Financial Resource Strain:   . Difficulty of Paying Living Expenses:   Food Insecurity:   . Worried About Charity fundraiser in the Last Year:   . Arboriculturist in the Last Year:   Transportation Needs:   . Film/video editor (Medical):   Marland Kitchen Lack of Transportation (Non-Medical):   Physical Activity:   . Days of Exercise per Week:   . Minutes of Exercise per Session:   Stress:   .  Feeling of Stress :   Social Connections:   . Frequency of Communication with Friends and Family:   . Frequency of Social Gatherings with Friends and Family:   . Attends Religious Services:   . Active Member of Clubs or Organizations:   . Attends Archivist Meetings:   Marland Kitchen Marital Status:    Tobacco Counseling Ready to quit: Not Answered Counseling given: Not Answered Comment: quit 2012 after smoking on and off a little for year. Now current smoker   Clinical Intake:                 Nutrition Risk Assessment:  Has the patient had any N/V/D within the last 2 months?  {YES/NO:21197} Does the patient have any non-healing wounds?  {YES/NO:21197} Has the patient  had any unintentional weight loss or weight gain?  {YES/NO:21197}  Diabetes:  Is the patient diabetic?  Yes  If diabetic, was a CBG obtained today?  Yes  Did the patient bring in their glucometer from home?  Yes Phone visit How often do you monitor your CBG's? ***.   Financial Strains and Diabetes Management:  Are you having any financial strains with the device, your supplies or your medication? {YES/NO:21197}.  Does the patient want to be seen by Chronic Care Management for management of their diabetes?  {YES/NO:21197} Would the patient like to be referred to a Nutritionist or for Diabetic Management?  {YES/NO:21197}  Diabetic Exams:  Diabetic Eye Exam: Completed ***. Overdue for diabetic eye exam. Pt has been advised about the importance in completing this exam. A referral has been placed today. Message sent to referral coordinator for scheduling purposes. Advised pt to expect a call from our office re: appt.  Diabetic Foot Exam: Completed 09/05/2019.        Activities of Daily Living In your present state of health, do you have any difficulty performing the following activities: 08/11/2019 04/03/2019  Hearing? N N  Vision? N N  Difficulty concentrating or making decisions? N N  Walking or  climbing stairs? N N  Dressing or bathing? N N  Doing errands, shopping? N -  Some recent data might be hidden     Immunizations and Health Maintenance Immunization History  Administered Date(s) Administered  . Fluad Quad(high Dose 65+) 06/17/2019  . PFIZER SARS-COV-2 Vaccination 08/15/2019, 09/09/2019  . Pneumococcal Conjugate-13 06/17/2019  . Tdap 07/14/2013  . Zoster Recombinat (Shingrix) 06/17/2019   Health Maintenance Due  Topic Date Due  . OPHTHALMOLOGY EXAM  Never done  . COLONOSCOPY  Never done  . INFLUENZA VACCINE  01/18/2020    Patient Care Team: Ronnald Nian, DO as PCP - General (Family Medicine) O'Neal, Cassie Freer, MD as PCP - Cardiology (Cardiology) Germaine Pomfret, [facility] as Pharmacist (Pharmacist)  Indicate any recent Medical Services you may have received from other than [facility] providers in the past year (date may be approximate).    Assessment:   This is a routine wellness examination for [place].  Hearing/Vision screen No exam data present  Dietary issues and exercise activities discussed:    Goals Addressed   None    Depression Screen PHQ 2/9 Scores 06/17/2019  PHQ - 2 Score 0    Fall Risk Fall Risk  06/17/2019  Falls in the past year? 0    FALL RISK PREVENTION PERTAINING TO THE HOME:  Any stairs in or around the home? {YES/NO:21197} If so, are there any without handrails? {YES/NO:21197}  Home free of loose throw rugs in walkways, pet beds, electrical cords, etc? {YES/NO:21197} Adequate lighting in your home to reduce risk of falls? {YES/NO:21197}  ASSISTIVE DEVICES UTILIZED TO PREVENT FALLS:  Life alert? {YES/NO:21197} Use of a cane, walker or w/c? {YES/NO:21197} Grab bars in the bathroom? {YES/NO:21197} Shower chair or bench in shower? {YES/NO:21197} Elevated toilet seat or a handicapped toilet? {YES/NO:21197}   TIMED UP AND GO:  Was the test performed? No . Phone visit    Cognitive Function:        Screening  Tests Health Maintenance  Topic Date Due  . OPHTHALMOLOGY EXAM  Never done  . COLONOSCOPY  Never done  . INFLUENZA VACCINE  01/18/2020  . Hepatitis C Screening  11/29/2025 (Originally 04/13/1951)  . PNA vac Low Risk Adult (2 of 2 - PPSV23) 06/16/2020  .  HEMOGLOBIN A1C  07/16/2020  . FOOT EXAM  09/04/2020  . TETANUS/TDAP  07/15/2023  . COVID-19 Vaccine  Completed    Qualifies for Shingles Vaccine? {YES/NO:21197} Zostavax completed ***. Due for Shingrix. Education has been provided regarding the importance of this vaccine. Pt has been advised to call insurance company to determine out of pocket expense. Advised may also receive vaccine at local pharmacy or Health Dept. Verbalized acceptance and understanding.  Tdap: Up to Date  Flu Vaccine: Due 02/2020  Pneumococcal Vaccine: Up to Date Pneumovax 23 Due 06/16/2020  Covid-19: iCompleted vaccines   Cancer Screenings:  Colorectal Screening: Completed ***. Repeat every *** years; No longer required. Referral to GI placed ***. Pt aware the office will call re: appt.  Lung Cancer Screening: (Low Dose CT Chest recommended if Age 43-80 years, 30 pack-year currently smoking OR have quit w/in 15years.) {DOES NOT does:27190::"does not"} qualify.   Lung Cancer Screening Referral: An Epic message has been sent to Burgess Estelle, RN (Oncology Nurse Navigator) regarding the possible need for this exam. Raquel Sarna will review the patient's chart to determine if the patient truly qualifies for the exam. If the patient qualifies, Raquel Sarna will order the Low Dose CT of the chest to facilitate the scheduling of this exam.  Additional Screening:  Hepatitis C Screening: {DOES NOT does:27190::"does not"} qualify; Completed ***  Vision Screening: Recommended annual ophthalmology exams for early detection of glaucoma and other disorders of the eye. Is the patient up to date with their annual eye exam?  {YES/NO:21197} Who is the provider or what is the name of the  office in which the pt attends annual eye exams? *** If pt is not established with a provider, would they like to be referred to a provider to establish care? {YES/NO:21197}. Ophthalmology referral has been placed. Pt aware the office will call re: appt.  Dental Screening: Recommended annual dental exams for proper oral hygiene  Community Resource Referral:  CRR required this visit?  {YES/NO:21197}       Plan:  I have personally reviewed and addressed the Medicare Annual Wellness questionnaire and have noted the following in the patient's chart:  A. Medical and social history B. Use of alcohol, tobacco or illicit drugs  C. Current medications and supplements D. Functional ability and status E.  Nutritional status F.  Physical activity G. Advance directives H. List of other physicians I.  Hospitalizations, surgeries, and ER visits in previous 12 months J.  [place] such as hearing and vision if needed, cognitive and depression L. Referrals and appointments   In addition, I have reviewed and discussed with patient certain preventive protocols, quality metrics, and best practice recommendations. A written personalized care plan for preventive services as well as general preventive health recommendations were provided to patient.  Due to this being a telephonic visit, the after visit summary with patients personalized plan was offered to patient via mail or my-chart. Patient would like to access on my-chart.   Signed,    Marta Antu, LPN   08/30/9700  Nurse Health Advisor    Nurse Notes:

## 2020-01-28 ENCOUNTER — Telehealth: Payer: Self-pay

## 2020-01-28 ENCOUNTER — Ambulatory Visit: Payer: Medicare Other

## 2020-01-28 NOTE — Telephone Encounter (Signed)
Patient was a no show for 10:30 AWV. Attempted to reach patient x 4 for telephone visit. Left a message for patient to call back to reschedule

## 2020-01-30 ENCOUNTER — Encounter (HOSPITAL_COMMUNITY): Payer: Medicare Other

## 2020-02-26 ENCOUNTER — Telehealth: Payer: Self-pay

## 2020-02-26 NOTE — Progress Notes (Signed)
Left Voice message to confirm patient telephone appointment on 02/27/2020 for CCM at 1:00pm  with Junius Argyle the Clinical pharmacist.    Prue Pharmacist Assistant [phone]

## 2020-02-27 ENCOUNTER — Ambulatory Visit: Payer: Medicare Other

## 2020-02-27 DIAGNOSIS — I10 Essential (primary) hypertension: Secondary | ICD-10-CM

## 2020-02-27 DIAGNOSIS — E119 Type 2 diabetes mellitus without complications: Secondary | ICD-10-CM

## 2020-02-27 NOTE — Chronic Care Management (AMB) (Signed)
Chronic Care Management Pharmacy  Name: Adam Perkins  MRN: [id number] DOB: 1951/02/04  Chief Complaint/ HPI  Adam Perkins,  69 y.o. , male presents for their Follow-Up CCM visit with the clinical pharmacist via telephone due to COVID-19 Pandemic.  PCP : Ronnald Nian, DO  Their chronic conditions include: Hypertension, Peripheral arterial disease, hyperlipidemia, type 2 diabetes, osteoarthritis, tobacco abuse  Office Visits: 01/14/20: Patient presented to Dr. Bryan Lemma for follow-up. A1c improved to 6.3%. No medication changes noted.   Consult Visits: 01/01/20: Patient presented to Dr. Fransico Michael (Cardiology). Lisinopril increased to 20 mg daily  11/24/19: Patient noted to be having a sense of "general unease" since increasing atorvastatin. Dr. Audie Box switched patient to rosuvastatin 40 mg.  10/08/19: Patient presented to Dr. Fransico Michael (Cardiology) for PAD follow-up. BP in clinic 152/70, LDL improved to 114. Atorvastatin increased to 80 mg daily.  09/05/19: Patient referred to Dr. Gardiner Barefoot (podaitry) for onychomycosis of toenails of both feet. Debridement of nails as patient unable to reach his feet. Plan for follow-up in 3 months. 08/27/10: Patient presented to Dr. Quay Burow for PAD follow-up. 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 Claudication improved. No medication changes noted. Follow-up in 6 months    Medications: Outpatient Encounter Medications as of 02/27/2020  Medication Sig  . Ascorbic Acid (VITAMIN C) 1000 MG tablet Take 100 mg by mouth daily.  Marland Kitchen aspirin EC 81 MG tablet Take 81 mg by mouth daily.  . clopidogrel (PLAVIX) 75 MG tablet Take 1 tablet (75 mg total) by mouth daily with breakfast.  . doxycycline (VIBRA-TABS) 100 MG tablet Take 100 mg by mouth 2 (two) times daily.  Marland Kitchen lisinopril (ZESTRIL) 20 MG tablet Take 1 tablet (20 mg total) by mouth daily.  . metFORMIN (GLUCOPHAGE XR) 500 MG 24 hr tablet Take 1 tablet  (500 mg total) by mouth daily with breakfast.  . rosuvastatin (CRESTOR) 40 MG tablet Take 1 tablet (40 mg total) by mouth daily.  Marland Kitchen selenium 50 MCG TABS tablet Take 50 mcg by mouth daily.  . timolol (BETIMOL) 0.25 % ophthalmic solution Place 1 drop into both eyes daily.   . timolol (TIMOPTIC) 0.5 % ophthalmic solution Place 1 drop into both eyes daily.   . vitamin E 200 UNIT capsule Take 200 Units by mouth daily.   Facility-Administered Encounter Medications as of 02/27/2020  Medication  . sodium chloride flush (NS) 0.9 % injection 3 mL    Current Diagnosis/Assessment:  SDOH Interventions     Most Recent Value  SDOH Interventions  Financial Strain Interventions Intervention Not Indicated  Transportation Interventions Intervention Not Indicated     Goals Addressed            This Visit's Progress   . Chronic Care Management   On track    CARE PLAN ENTRY  Current Barriers:  . Chronic Disease Management support, education, and care coordination needs related to Hypertension, Hyperlipidemia, Diabetes, Tobacco use, and Peripheral artery disease   Hypertension BP Readings from Last 3 Encounters:  10/08/19 (!) 152/70  09/05/19 138/87  08/27/19 128/62   . Pharmacist Clinical Goal(s): o Over the next 90 days, patient will work with PharmD and providers to maintain BP goal <140/90 . Current regimen:  o Lisinopril 20 mg daily . Patient self care activities - Over the next 90 days, patient will: o Check blood pressure at least weekly, document, and provide at future appointments o Ensure daily salt intake <  2300 mg/day  Hyperlipidemia Lab Results  Component Value Date/Time   LDLCALC 114 (H) 10/08/2019 04:06 PM   LDLDIRECT 220.0 06/17/2019 11:14 AM   . Pharmacist Clinical Goal(s): o Over the next 90 days, patient will work with PharmD and providers to achieve LDL goal < 70 . Current regimen:  o Rosuvastatin 40 mg daily  . Interventions: o Recommend fasting lipid panel to  re-assess cholesterol.  Diabetes Lab Results  Component Value Date/Time   HGBA1C 7.0 (H) 06/17/2019 11:14 AM   HGBA1C 6.2 11/30/2015 12:05 PM   . Pharmacist Clinical Goal(s): o Over the next 90 days, patient will work with PharmD and providers to achieve A1c goal <6.5% . Current regimen:  o Metformin XR 500 mg daily  . Interventions: o Recommend rechecking A1c to re-assess diabetes control . Patient self care activities - Over the next 90 days, patient will: o Check blood sugar if symptomatic, document, and provide at future appointments o Contact provider with any episodes of hypoglycemia  Medication management . Pharmacist Clinical Goal(s): o Over the next 90 days, patient will work with PharmD and providers to maintain optimal medication adherence . Current pharmacy: [facility] . Interventions o Comprehensive medication review performed. o Utilize [facility] for medication synchronization, packaging and delivery . Patient self care activities - Over the next 90 days, patient will: o Take medications as prescribed o Report any questions or concerns to PharmD and/or provider(s)       Diabetes   Recent Relevant Labs: Lab Results  Component Value Date/Time   HGBA1C 6.3 01/14/2020 09:54 AM   HGBA1C 7.0 (H) 06/17/2019 11:14 AM   MICROALBUR 1.5 11/30/2015 12:05 PM     Checking BG: Never  Recent FBG Readings: n/a Recent pre-meal BG readings: n/a Recent 2hr PP BG readings:  n/a Recent HS BG readings: n/a  Patient has failed these meds in past: n/a Patient is currently controlled on the following medications:   Metformin XR 500 mg daily A1c goal <6.5%   Last diabetic Foot exam: No results found for: HMDIABEYEEXA  Last diabetic Eye exam: No results found for: HMDIABFOOTEX   We discussed: diet and exercise extensively. Loves to cook. Has been make conscious substitutions to eat healthier (butter -> ghee) (sour cream -> yogurt). Has been trying to  eliminate bread.    Plan  Continue current medications   Hypertension   Office blood pressures are  BP Readings from Last 3 Encounters:  01/14/20 (!) 136/78  10/08/19 (!) 152/70  09/05/19 138/87   CMP Latest Ref Rng & Units 08/12/2019 07/23/2019 06/17/2019  Glucose 70 - 99 mg/dL 125(H) 122(H) 143(H)  BUN 8 - 23 mg/dL 10 13 14   Creatinine 0.61 - 1.24 mg/dL 0.78 0.80 0.74  Sodium 135 - 145 mmol/L 139 138 135  Potassium 3.5 - 5.1 mmol/L 3.7 5.0 4.3  Chloride 98 - 111 mmol/L 107 100 101  CO2 22 - 32 mmol/L 24 23 26   Calcium 8.9 - 10.3 mg/dL 8.7(L) 9.8 9.5  Total Protein 6.1 - 8.1 g/dL - - -  Total Bilirubin 0.2 - 1.2 mg/dL - - -  Alkaline Phos 40 - 115 U/L - - -  AST 10 - 35 U/L - - -  ALT 9 - 46 U/L - - -    Patient has failed these meds in the past: n/a Patient is currently controlled on the following medications:   Lisinopril 20 mg daily   Patient checks BP at home 1-2x per week  Patient home BP readings are ranging: 125 -130 /70   We discussed diet and exercise extensively. Walking more consistently (5000-6000 steps daily) and exercising more frequently.   Plan  Continue current medications.   Hyperlipidemia   Lipid Panel     Component Value Date/Time   CHOL 176 10/08/2019 1606   TRIG 118 10/08/2019 1606   HDL 41 10/08/2019 1606   CHOLHDL 4.3 10/08/2019 1606   CHOLHDL 7 06/17/2019 1114   VLDL 43.0 (H) 06/17/2019 1114   LDLCALC 114 (H) 10/08/2019 1606   LDLDIRECT 220.0 06/17/2019 1114   LABVLDL 21 10/08/2019 1606     The 10-year ASCVD risk score Mikey Bussing DC Jr., et al., 2013) is: 45.6%   Values used to calculate the score:     Age: 49 years     Sex: Male     Is Non-Hispanic African American: No     Diabetic: Yes     Tobacco smoker: Yes     Systolic Blood Pressure: 829 mmHg     Is BP treated: Yes     HDL Cholesterol: 41 mg/dL     Total Cholesterol: 176 mg/dL   Patient has failed these meds in past: pravastatin, atorvastatin 80 mg "uneasy" Patient is  currently uncontrolled on the following medications:   Rosuvastatin 40 mg daily   We discussed:  diet and exercise extensively. Patient states feeling better following switch from atorvastatin to rosuvastatin.   Plan  Continue current medications   PAD   Patient has failed these meds in past: n/a Patient is currently controlled on the following medications:   Clopidogrel 75 mg daily (6 month duration)   Aspirin 81 mg daily   We discussed: Denies blood in urine/stool. Denies symptoms of claudication.   Plan  Continue current medications   Tobacco Abuse   Tobacco Status:  Social History   Tobacco Use  Smoking Status Current Every Day Smoker  . Packs/day: 1.00  . Years: 25.00  . Pack years: 25.00  . Types: Cigarettes  Smokeless Tobacco Never Used  Tobacco Comment   quit 2012 after smoking on and off a little for year. Now current smoker    Patient smokes Within 30 minutes of waking Patient triggers include: stress, finishing a meal and Talking on the phone or relaxing. On a scale of 1-10, reports MOTIVATION to quit is 1 On a scale of 1-10, reports CONFIDENCE in quitting is 1  Patient has failed these meds in past: Nicotine 21 mg/24hr patch daily (takes off at night)  Patient is currently uncontrolled on the following medications: none.   We discussed:  Patient has been stressed due to a possible a separation with his wife. He has been currently handling his stress by smoking more. He states he knows it is not healthy but it has been his crutch. Counseled patient on the importance of smoking cessation to maintain his health and that we will continue to address this at each visit.   Plan  Recommend resuming nicotine 21 mg/24 hr patch daily for 6 weeks Recommend nicotine 2 mg lozenge as needed for breakthrough cravings.    Misc/OTC   Selenium 50 mcg daily- free radicals  Timolol 0.5% opth Vitamin E 200u daily -free radicals  Vitamin C 1000 mg daily -free radicals     Vaccines   Reviewed and discussed patient's vaccination history.    Immunization History  Administered Date(s) Administered  . Fluad Quad(high Dose 65+) 06/17/2019  . PFIZER SARS-COV-2 Vaccination 08/15/2019, 09/09/2019  .  Pneumococcal Conjugate-13 06/17/2019  . Tdap 07/14/2013  . Zoster Recombinat (Shingrix) 06/17/2019    Plan  Recommended patient receive 2nd dose of Shingrix vaccine  Medication Management   Pt uses [facility] for all medications.   Reviewed chart for medication changes ahead of medication coordination call.  No medication changes noted.   BP Readings from Last 3 Encounters:  01/14/20 (!) 136/78  10/08/19 (!) 152/70  09/05/19 138/87    Lab Results  Component Value Date   HGBA1C 6.3 01/14/2020     Patient obtains medications through Vials  90 Days   Last adherence delivery included:   Rosuvastatin 40 mg - BREAKFAST  Clopidogrel 75 mg - BREAKFAST  Metformin ER 500 mg - BREAKFAST  Lisinopril 10 mg - BREAKFAST  Patient is due for next adherence delivery on: 12/05/19. Called patient and reviewed medications and coordinated delivery.  This delivery to include:  Rosuvastatin 40 mg daily  Clopidogrel 75 mg daily  Metformin ER 500 mg daily  Lisinopril 20 mg daily   Aspirin 81 mg daily   Patient declined the following medications (meds) due to (reason)  Vitamin C 1000 mg (Adequate supply-OTC)  Selenium (Adequate supply-OTC)  Timolol (Adequate supply-OTC)  Vitamin E 200 units (Adequate supply-OTC)  Confirmed delivery date of 02/27/20, advised patient that pharmacy will contact them the morning of delivery.  Follow up: 3 months  [place] at [facility]  [phone]

## 2020-03-01 ENCOUNTER — Other Ambulatory Visit: Payer: Self-pay

## 2020-03-01 ENCOUNTER — Ambulatory Visit (HOSPITAL_COMMUNITY)
Admission: RE | Admit: 2020-03-01 | Discharge: 2020-03-01 | Disposition: A | Discharge status: Home / Self Care | Payer: Medicare Other | Source: Ambulatory Visit | Attending: Cardiovascular Disease | Admitting: Cardiovascular Disease

## 2020-03-01 ENCOUNTER — Ambulatory Visit (HOSPITAL_BASED_OUTPATIENT_CLINIC_OR_DEPARTMENT_OTHER)
Admission: RE | Admit: 2020-03-01 | Discharge: 2020-03-01 | Disposition: A | Discharge status: Home / Self Care | Payer: Medicare Other | Source: Ambulatory Visit | Attending: Cardiovascular Disease | Admitting: Cardiovascular Disease

## 2020-03-01 DIAGNOSIS — I700 Atherosclerosis of aorta: Secondary | ICD-10-CM | POA: Diagnosis not present

## 2020-03-01 DIAGNOSIS — Z95828 Presence of other vascular implants and grafts: Secondary | ICD-10-CM

## 2020-03-01 DIAGNOSIS — I739 Peripheral vascular disease, unspecified: Secondary | ICD-10-CM | POA: Insufficient documentation

## 2020-03-08 ENCOUNTER — Telehealth: Payer: Self-pay | Admitting: Family Medicine

## 2020-03-08 NOTE — Telephone Encounter (Signed)
Left message for patient to schedule Annual Wellness Visit.  Please schedule with Nurse Health Advisor Martha Stanley, RN at [facility]  °

## 2020-03-17 ENCOUNTER — Encounter: Payer: Self-pay | Admitting: Cardiovascular Disease

## 2020-03-17 ENCOUNTER — Other Ambulatory Visit: Payer: Self-pay

## 2020-03-17 ENCOUNTER — Ambulatory Visit (INDEPENDENT_AMBULATORY_CARE_PROVIDER_SITE_OTHER): Payer: Medicare Other | Admitting: Cardiovascular Disease

## 2020-03-17 VITALS — BP 152/60 | HR 77 | Temp 95.4°F | Ht 69.0 in | Wt 184.0 lb

## 2020-03-17 DIAGNOSIS — I739 Peripheral vascular disease, unspecified: Secondary | ICD-10-CM | POA: Diagnosis not present

## 2020-03-17 DIAGNOSIS — E785 Hyperlipidemia, unspecified: Secondary | ICD-10-CM | POA: Diagnosis not present

## 2020-03-17 DIAGNOSIS — E1169 Type 2 diabetes mellitus with other specified complication: Secondary | ICD-10-CM | POA: Diagnosis not present

## 2020-03-17 DIAGNOSIS — R0989 Other specified symptoms and signs involving the circulatory and respiratory systems: Secondary | ICD-10-CM | POA: Diagnosis not present

## 2020-03-17 DIAGNOSIS — I10 Essential (primary) hypertension: Secondary | ICD-10-CM | POA: Diagnosis not present

## 2020-03-17 MED ORDER — HYDROCHLOROTHIAZIDE 12.5 MG PO CAPS: 12.5000 mg | ORAL_CAPSULE | Freq: Every day | ORAL | 3 refills | Status: DC

## 2020-03-17 NOTE — Assessment & Plan Note (Signed)
History of essential hypertension with blood pressure measured today 152/60.  He does check his blood pressure at home and it is in this range.  He is on lisinopril 20 mg a day.  I am going to add low-dose hydrochlorothiazide 12.5 mg and will check a basic metabolic panel in 10 days.  He is aware of restricting salt intake.  He is under some stress at home which may be contributory to his elevated blood pressure

## 2020-03-17 NOTE — Patient Instructions (Signed)
Medication Instructions:  Start Hydrochlorothiazide 12.5 mg daily   *If you need a refill on your cardiac medications before your next appointment, please call your pharmacy*   Lab Work: BMET, LIPID, LIVER in 10 days, no lab appointment needed., come fasting- nothing to eat or drink. If you have labs (blood work) drawn today and your tests are completely normal, you will receive your results only by:  [place] (if you have MyChart) OR  A paper copy in the mail If you have any lab test that is abnormal or we need to change your treatment, we will call you to review the results.   Testing/Procedures: Your physician has requested that you have a carotid duplex. This test is an ultrasound of the carotid arteries in your neck. It looks at blood flow through these arteries that supply the brain with blood. Allow one hour for this exam. There are no restrictions or special instructions.  AORTA/ILLIAC/IVC (12 months)   Follow-Up: At [facility], you and your health needs are our priority.  As part of our continuing mission to provide you with exceptional heart care, we have created designated Provider Care Teams.  These Care Teams include your primary Cardiologist (physician) and Advanced Practice Providers (APPs -  Physician Assistants and Nurse Practitioners) who all work together to provide you with the care you need, when you need it.  We recommend signing up for the patient portal called "MyChart".  Sign up information is provided on this After Visit Summary.  MyChart is used to connect with patients for Virtual Visits (Telemedicine).  Patients are able to view lab/test results, encounter notes, upcoming appointments, etc.  Non-urgent messages can be sent to your provider as well.   To learn more about what you can do with MyChart, go to [email or web address].    Your next appointment:   1 month(s) / 12 months  The format for your next appointment:   In Person  Provider:    Eleonore Chiquito, MD / Dr.Berry

## 2020-03-17 NOTE — Assessment & Plan Note (Signed)
History of hyperlipidemia on high-dose Crestor.  His last lipid profile performed 10/08/2019 revealed total cholesterol 176, LDL 114 and HDL 41.  We will recheck a lipid liver profile.

## 2020-03-17 NOTE — Progress Notes (Signed)
03/17/2020 [name]   1951/01/08  [id number]  Primary Physician Ronnald Nian, DO Primary Cardiologist: Lorretta Harp MD Adam Perkins, [place], [place]  HPI:  Adam Perkins is a 69 y.o.  mild to moderately overweight married Caucasian male father of 2 children, grandfather 2 grandchildren who is retired from [employer]. He is referred by Dr. Davina Poke for evaluation of symptomatic PAD.  I last saw him in the office 08/27/2019.He has a 30+ pack year history tobacco abuse smoking 1/2 pack/day now since he was in his mid 47s. He drinks 1 to 2 glasses of wine a night. He does have treated hypertension, diabetes and hyperlipidemia. Is never had a heart attack or stroke. There is no family history of heart disease. Denies chest pain or shortness of breath. 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 Ninfa Linden 12/15. 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  I performed peripheral angiography, PTA and covered stenting of an occluded right common iliac artery on 08/11/2019.  His follow-up Doppler studies performed 08/25/2019 revealed normal ABIs and velocities.  His claudication has resolved.  He is on aspirin Plavix.  Since I saw him 6 months ago he continues to do well.  He denies claudication.  Recent Dopplers performed 03/01/2020 revealed a widely patent stent with moderate left iliac disease although he has no claudication on that side.  He denies chest pain or shortness of breath.  Blood pressure is mildly elevated.  He is on high-dose Crestor for hyperlipidemia but still has an LDL above 100.   Current Meds  Medication Sig  . Ascorbic Acid (VITAMIN C) 1000 MG tablet Take 100 mg by mouth daily.  Marland Kitchen aspirin EC 81 MG tablet Take 81 mg by mouth daily.  . clopidogrel  (PLAVIX) 75 MG tablet Take 1 tablet (75 mg total) by mouth daily with breakfast.  . doxycycline (VIBRA-TABS) 100 MG tablet Take 100 mg by mouth 2 (two) times daily.  Marland Kitchen lisinopril (ZESTRIL) 20 MG tablet Take 1 tablet (20 mg total) by mouth daily.  . metFORMIN (GLUCOPHAGE XR) 500 MG 24 hr tablet Take 1 tablet (500 mg total) by mouth daily with breakfast.  . selenium 50 MCG TABS tablet Take 50 mcg by mouth daily.  . timolol (BETIMOL) 0.25 % ophthalmic solution Place 1 drop into both eyes daily.   . timolol (TIMOPTIC) 0.5 % ophthalmic solution Place 1 drop into both eyes daily.   . vitamin E 200 UNIT capsule Take 200 Units by mouth daily.   Current Facility-Administered Medications for the 03/17/20 encounter (Office Visit) with Lorretta Harp, MD  Medication  . sodium chloride flush (NS) 0.9 % injection 3 mL     No Known Allergies  Social History   Socioeconomic History  . Marital status: Married    Spouse name: Not on file  . Number of children: 2  . Years of education: Not on file  . Highest education level: Not on file  Occupational History  . Not on file  Tobacco Use  . Smoking status: Current Every Day Smoker    Packs/day: 1.00    Years: 25.00    Pack years: 25.00    Types: Cigarettes  . Smokeless tobacco: Never Used  .  Tobacco comment: quit 2012 after smoking on and off a little for year. Now current smoker  Substance and Sexual Activity  . Alcohol use: Yes    Alcohol/week: 0.0 standard drinks    Comment: 2 glasses of wine daily   . Drug use: No  . Sexual activity: Not Currently  Other Topics Concern  . Not on file  Social History Narrative  . Not on file   Social Determinants of Health   Financial Resource Strain: Low Risk   . Difficulty of Paying Living Expenses: Not hard at all  Food Insecurity:   . Worried About Charity fundraiser in the Last Year: Not on file  . Ran Out of Food in the Last Year: Not on file  Transportation Needs: No Transportation Needs    . Lack of Transportation (Medical): No  . Lack of Transportation (Non-Medical): No  Physical Activity:   . Days of Exercise per Week: Not on file  . Minutes of Exercise per Session: Not on file  Stress:   . Feeling of Stress : Not on file  Social Connections:   . Frequency of Communication with Friends and Family: Not on file  . Frequency of Social Gatherings with Friends and Family: Not on file  . Attends Religious Services: Not on file  . Active Member of Clubs or Organizations: Not on file  . Attends Archivist Meetings: Not on file  . Marital Status: Not on file  Intimate Partner Violence:   . Fear of Current or Ex-Partner: Not on file  . Emotionally Abused: Not on file  . Physically Abused: Not on file  . Sexually Abused: Not on file     Review of Systems: General: negative for chills, fever, night sweats or weight changes.  Cardiovascular: negative for chest pain, dyspnea on exertion, edema, orthopnea, palpitations, paroxysmal nocturnal dyspnea or shortness of breath Dermatological: negative for rash Respiratory: negative for cough or wheezing Urologic: negative for hematuria Abdominal: negative for nausea, vomiting, diarrhea, bright red blood per rectum, melena, or hematemesis Neurologic: negative for visual changes, syncope, or dizziness All other systems reviewed and are otherwise negative except as noted above.    Blood pressure (!) 152/60, pulse 77, temperature (!) 95.4 F (35.2 C), height 5\' 9"  (1.753 m), weight 184 lb (83.5 kg).  General appearance: alert and no distress Neck: no adenopathy, no JVD, supple, symmetrical, trachea midline, thyroid not enlarged, symmetric, no tenderness/mass/nodules and Left carotid bruit Lungs: clear to auscultation bilaterally Heart: regular rate and rhythm, S1, S2 normal, no murmur, click, rub or gallop Extremities: extremities normal, atraumatic, no cyanosis or edema Pulses: 2+ and symmetric Skin: Skin color, texture,  turgor normal. No rashes or lesions Neurologic: Alert and oriented X 3, normal strength and tone. Normal symmetric reflexes. Normal coordination and gait  EKG sinus rhythm at 77 with nonspecific ST and T wave changes.  I personally reviewed this EKG.  ASSESSMENT AND PLAN:   Hyperlipidemia associated with type 2 diabetes mellitus ([place]) History of hyperlipidemia on high-dose Crestor.  His last lipid profile performed 10/08/2019 revealed total cholesterol 176, LDL 114 and HDL 41.  We will recheck a lipid liver profile.  HTN (hypertension) History of essential hypertension with blood pressure measured today 152/60.  He does check his blood pressure at home and it is in this range.  He is on lisinopril 20 mg a day.  I am going to add low-dose hydrochlorothiazide 12.5 mg and will check a basic metabolic panel in  10 days.  He is aware of restricting salt intake.  He is under some stress at home which may be contributory to his elevated blood pressure  Peripheral arterial disease ([place]) History of PAD status post right common iliac artery CTO PTA and covered stenting by myself using 2 VBX stents 08/11/2019.  He did have a 40% left common iliac artery stenosis.  His claudication resolved after that.  His last Doppler study performed 03/01/2020 revealed normal ABIs with widely patent right iliac stents and moderate left iliac disease although he has no symptoms on that side.  We will continue to follow his Doppler studies on annual basis.      Lorretta Harp MD FACP,FACC,FAHA, [facility] 03/17/2020 3:59 PM

## 2020-03-17 NOTE — Assessment & Plan Note (Signed)
History of PAD status post right common iliac artery CTO PTA and covered stenting by myself using 2 VBX stents 08/11/2019.  He did have a 40% left common iliac artery stenosis.  His claudication resolved after that.  His last Doppler study performed 03/01/2020 revealed normal ABIs with widely patent right iliac stents and moderate left iliac disease although he has no symptoms on that side.  We will continue to follow his Doppler studies on annual basis.

## 2020-03-22 ENCOUNTER — Other Ambulatory Visit (INDEPENDENT_AMBULATORY_CARE_PROVIDER_SITE_OTHER): Payer: Medicare Other

## 2020-03-22 DIAGNOSIS — I10 Essential (primary) hypertension: Secondary | ICD-10-CM

## 2020-03-24 ENCOUNTER — Other Ambulatory Visit: Payer: Self-pay

## 2020-03-24 ENCOUNTER — Encounter: Payer: Self-pay | Admitting: Family Medicine

## 2020-03-25 ENCOUNTER — Encounter: Payer: Self-pay | Admitting: Family Medicine

## 2020-03-25 ENCOUNTER — Ambulatory Visit (INDEPENDENT_AMBULATORY_CARE_PROVIDER_SITE_OTHER): Payer: Medicare Other | Admitting: Family Medicine

## 2020-03-25 VITALS — BP 142/84 | HR 63 | Temp 97.6°F | Ht 69.0 in | Wt 183.2 lb

## 2020-03-25 DIAGNOSIS — L729 Follicular cyst of the skin and subcutaneous tissue, unspecified: Secondary | ICD-10-CM | POA: Diagnosis not present

## 2020-03-25 NOTE — Progress Notes (Signed)
Adam Perkins is a 69 y.o. male  Chief Complaint  Patient presents with  . Acute Visit    small skin growth in groin area that has been there for a couple years but has gotten larger.     HPI: Adam Perkins is a 69 y.o. male who complains of skin growth in pubic area x years that has gotten larger recently. No pain or tenderness. Not bothersome. No drainage. No fever, chills.   Past Medical History:  Diagnosis Date  . Arthritis   . Avascular necrosis of bone of right hip ([place]) 05/28/2014  . Chicken pox   . Diabetes mellitus without complication ([place])   . Elevated blood pressure 08/19/2012  . Hyperglycemia 12/30/2015  . Hyperlipemia 08/19/2012  . Hypertension   . Kidney stone    nephrolithiasis  . Pain in limb 10/20/2013  . 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 ABDOMINAL AORTOGRAM W/LOWER EXTREMITY N/A 08/11/2019   Procedure: ABDOMINAL AORTOGRAM W/LOWER EXTREMITY;  Surgeon: Lorretta Harp, MD;  Location: [facility];  Service: Cardiovascular;  Laterality: N/A;  . CHEILECTOMY Right 04/03/2019   Procedure: RIGHT DORSAL CHEILECTOMY;  Surgeon: Erle Crocker, MD;  Location: [facility];  Service: Orthopedics;  Laterality: Right;  SURGERY REQUEST TIME 90 MIN  . PERIPHERAL VASCULAR INTERVENTION Right 08/11/2019   Procedure: PERIPHERAL VASCULAR INTERVENTION;  Surgeon: Lorretta Harp, MD;  Location: [facility];  Service: Cardiovascular;  Laterality: Right;  Iliac  . TOTAL HIP ARTHROPLASTY Right 05/28/2014   Procedure: RIGHT TOTAL HIP ARTHROPLASTY ANTERIOR APPROACH;  Surgeon: Mcarthur Rossetti, MD;  Location: [facility];  Service: Orthopedics;  Laterality: Right;    Social History   Socioeconomic History  . Marital status: Married    Spouse name: Not on file  . Number of children: 2  . Years of education: Not on file  . Highest education level: Not on file  Occupational  History  . Not on file  Tobacco Use  . Smoking status: Current Every Day Smoker    Packs/day: 1.00    Years: 25.00    Pack years: 25.00    Types: Cigarettes  . Smokeless tobacco: Never Used  . Tobacco comment: quit 2012 after smoking on and off a little for year. Now current smoker  Substance and Sexual Activity  . Alcohol use: Yes    Alcohol/week: 0.0 standard drinks    Comment: 2 glasses of wine daily   . Drug use: No  . Sexual activity: Not Currently  Other Topics Concern  . Not on file  Social History Narrative  . Not on file   Social Determinants of Health   Financial Resource Strain: Low Risk   . Difficulty of Paying Living Expenses: Not hard at all  Food Insecurity:   . Worried About Charity fundraiser in the Last Year: Not on file  . Ran Out of Food in the Last Year: Not on file  Transportation Needs: No Transportation Needs  . Lack of Transportation (Medical): No  . Lack of Transportation (Non-Medical): No  Physical Activity:   . Days of Exercise per Week: Not on file  . Minutes of Exercise per Session: Not on file  Stress:   . Feeling of Stress : Not on file  Social Connections:   . Frequency of Communication with Friends and Family: Not on file  . Frequency of Social  Gatherings with Friends and Family: Not on file  . Attends Religious Services: Not on file  . Active Member of Clubs or Organizations: Not on file  . Attends Archivist Meetings: Not on file  . Marital Status: Not on file  Intimate Partner Violence:   . Fear of Current or Ex-Partner: Not on file  . Emotionally Abused: Not on file  . Physically Abused: Not on file  . Sexually Abused: Not on file    Family History  Problem Relation Age of Onset  . Arthritis Father   . Cancer Father 30       bladder cancer  . Diabetes Mother      Immunization History  Administered Date(s) Administered  . Fluad Quad(high Dose 65+) 06/17/2019  . PFIZER SARS-COV-2 Vaccination 08/15/2019,  09/09/2019  . Pneumococcal Conjugate-13 06/17/2019  . Tdap 07/14/2013  . Zoster Recombinat (Shingrix) 06/17/2019    Outpatient Encounter Medications as of 03/25/2020  Medication Sig  . Ascorbic Acid (VITAMIN C) 1000 MG tablet Take 100 mg by mouth daily.  Marland Kitchen aspirin EC 81 MG tablet Take 81 mg by mouth daily.  . clopidogrel (PLAVIX) 75 MG tablet Take 1 tablet (75 mg total) by mouth daily with breakfast.  . hydrochlorothiazide (MICROZIDE) 12.5 MG capsule Take 1 capsule (12.5 mg total) by mouth daily.  Marland Kitchen lisinopril (ZESTRIL) 20 MG tablet Take 1 tablet (20 mg total) by mouth daily.  . metFORMIN (GLUCOPHAGE XR) 500 MG 24 hr tablet Take 1 tablet (500 mg total) by mouth daily with breakfast.  . rosuvastatin (CRESTOR) 40 MG tablet Take 1 tablet (40 mg total) by mouth daily.  Marland Kitchen selenium 50 MCG TABS tablet Take 50 mcg by mouth daily.  . timolol (TIMOPTIC) 0.5 % ophthalmic solution Place 1 drop into both eyes daily.   . vitamin E 200 UNIT capsule Take 200 Units by mouth daily.  Marland Kitchen doxycycline (VIBRA-TABS) 100 MG tablet Take 100 mg by mouth 2 (two) times daily. (Patient not taking: Reported on 03/25/2020)  . timolol (BETIMOL) 0.25 % ophthalmic solution Place 1 drop into both eyes daily.  (Patient not taking: Reported on 03/25/2020)   Facility-Administered Encounter Medications as of 03/25/2020  Medication  . sodium chloride flush (NS) 0.9 % injection 3 mL     ROS: Pertinent positives and negatives noted in HPI. Remainder of ROS non-contributory   No Known Allergies  BP (!) 142/84   Pulse 63   Temp 97.6 F (36.4 C) (Temporal)   Ht 5\' 9"  (1.753 m)   Wt 183 lb 3.2 oz (83.1 kg)   SpO2 98%   BMI 27.05 kg/m    BP Readings from Last 3 Encounters:  03/25/20 (!) 142/84  03/17/20 (!) 152/60  01/14/20 (!) 136/78   Pulse Readings from Last 3 Encounters:  03/25/20 63  03/17/20 77  01/14/20 61    Physical Exam Vitals reviewed.  Constitutional:      General: He is not in acute distress.     Appearance: Normal appearance. He is not ill-appearing.  Lymphadenopathy:     Lower Body: No right inguinal adenopathy. No left inguinal adenopathy.  Skin:      Neurological:     Mental Status: He is alert and oriented to person, place, and time.  Psychiatric:        Mood and Affect: Mood normal.        Behavior: Behavior normal.     A/P:  1. Cyst of skin - Left suprapubic region cyst, present  x years but increased in size in the past few months - pt desires excision vs drainage so referral placed to [facility] Surgery - Ambulatory referral to General Surgery Discussed plan and reviewed medications with patient, including risks, benefits, and potential side effects. Pt expressed understand. All questions answered.   This visit occurred during the SARS-CoV-2 public health emergency.  Safety protocols were in place, including screening questions prior to the visit, additional usage of staff PPE, and extensive cleaning of exam room while observing appropriate contact time as indicated for disinfecting solutions.

## 2020-03-25 NOTE — Patient Instructions (Signed)
Referral to [facility] [address] [place], [place], [place] [address] [phone]

## 2020-03-26 ENCOUNTER — Telehealth: Payer: Self-pay

## 2020-03-26 NOTE — Progress Notes (Signed)
Chronic Care Management Pharmacy Assistant   Name: Adam Perkins  MRN: [id number] DOB: 1950-06-21  Reason for Encounter: Medication Review   PCP : Ronnald Nian, DO  Allergies:  No Known Allergies  Medications: Outpatient Encounter Medications as of 03/26/2020  Medication Sig  . Ascorbic Acid (VITAMIN C) 1000 MG tablet Take 100 mg by mouth daily.  Marland Kitchen aspirin EC 81 MG tablet Take 81 mg by mouth daily.  . clopidogrel (PLAVIX) 75 MG tablet Take 1 tablet (75 mg total) by mouth daily with breakfast.  . doxycycline (VIBRA-TABS) 100 MG tablet Take 100 mg by mouth 2 (two) times daily. (Patient not taking: Reported on 03/25/2020)  . hydrochlorothiazide (MICROZIDE) 12.5 MG capsule Take 1 capsule (12.5 mg total) by mouth daily.  Marland Kitchen lisinopril (ZESTRIL) 20 MG tablet Take 1 tablet (20 mg total) by mouth daily.  . metFORMIN (GLUCOPHAGE XR) 500 MG 24 hr tablet Take 1 tablet (500 mg total) by mouth daily with breakfast.  . rosuvastatin (CRESTOR) 40 MG tablet Take 1 tablet (40 mg total) by mouth daily.  Marland Kitchen selenium 50 MCG TABS tablet Take 50 mcg by mouth daily.  . timolol (BETIMOL) 0.25 % ophthalmic solution Place 1 drop into both eyes daily.  (Patient not taking: Reported on 03/25/2020)  . timolol (TIMOPTIC) 0.5 % ophthalmic solution Place 1 drop into both eyes daily.   . vitamin E 200 UNIT capsule Take 200 Units by mouth daily.   Facility-Administered Encounter Medications as of 03/26/2020  Medication  . sodium chloride flush (NS) 0.9 % injection 3 mL    Current Diagnosis: Patient Active Problem List   Diagnosis Date Noted  . Pain due to onychomycosis of toenails of both feet 09/05/2019  . Claudication in peripheral vascular disease ([place]) 08/11/2019  . Peripheral arterial disease ([place]) 07/23/2019  . Controlled type 2 diabetes mellitus without complication, without long-term current use of insulin ([place]) 12/30/2015  . 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 Osteoarthritis  06/03/2013  . Hyperlipidemia associated with type 2 diabetes mellitus ([place]) 08/19/2012  . HTN (hypertension) 08/19/2012     Follow-Up:  Pharmacist Review   Reviewed chart for medication changes ahead of medication coordination call.  No OVs, Consults, or hospital visits since last care coordination call/Pharmacist visit.   03/25/2020 PCP Letta Median  03/17/2020 Cardiology Quay Burow Medication changes indicated   03/17/2020 Cardiology started hydrochlorothiazide 12.5mg   BP Readings from Last 3 Encounters:  03/25/20 (!) 142/84  03/17/20 (!) 152/60  01/14/20 (!) 136/78    Lab Results  Component Value Date   HGBA1C 6.3 01/14/2020     Patient obtains medications through Vials  90 Days   Last adherence delivery included: (medication name and frequency)    Rosuvastatin 40 mg daily             Clopidogrel 75 mg daily             Metformin ER 500 mg daily             Lisinopril 20 mg daily              Aspirin 81 mg daily   Patient declined (meds) last month due to PRN use/additional supply on hand.    Vitamin C 1000 mg (Adequate supply-OTC)         Selenium (Adequate supply-OTC)         Timolol (Adequate supply-OTC)  Vitamin E 200 units (Adequate supply-OTC)  Patient is due for next adherence delivery on: no fill needed. Called patient and reviewed medications and coordinated delivery.  This delivery to include:  No fill needed  Patient declined the following medications (meds) due to (reason)  Patient needs refills for None ID.  Confirmed delivery date of no fill needed, advised patient that pharmacy will contact them the morning of delivery.  [place] Pharmacist Assistant [phone]

## 2020-03-29 ENCOUNTER — Other Ambulatory Visit (HOSPITAL_COMMUNITY): Payer: Self-pay | Admitting: Student

## 2020-03-29 ENCOUNTER — Encounter: Payer: Self-pay | Admitting: Family Medicine

## 2020-03-29 DIAGNOSIS — E1169 Type 2 diabetes mellitus with other specified complication: Secondary | ICD-10-CM | POA: Diagnosis not present

## 2020-03-29 DIAGNOSIS — L723 Sebaceous cyst: Secondary | ICD-10-CM | POA: Diagnosis not present

## 2020-03-29 DIAGNOSIS — L02214 Cutaneous abscess of groin: Secondary | ICD-10-CM | POA: Diagnosis not present

## 2020-03-29 DIAGNOSIS — E785 Hyperlipidemia, unspecified: Secondary | ICD-10-CM | POA: Diagnosis not present

## 2020-03-29 DIAGNOSIS — I10 Essential (primary) hypertension: Secondary | ICD-10-CM | POA: Diagnosis not present

## 2020-03-29 LAB — LIPID PANEL
Chol/HDL Ratio: 3.1 ratio (ref 0.0–5.0)
Cholesterol, Total: 152 mg/dL (ref 100–199)
HDL: 49 mg/dL (ref >39)
LDL Chol Calc (NIH): 86 mg/dL (ref 0–99)
Triglycerides: 90 mg/dL (ref 0–149)
VLDL Cholesterol Cal: 17 mg/dL (ref 5–40)

## 2020-03-29 LAB — BASIC METABOLIC PANEL
BUN/Creatinine Ratio: 17 (ref 10–24)
BUN: 20 mg/dL (ref 8–27)
CO2: 25 mmol/L (ref 20–29)
Calcium: 9.7 mg/dL (ref 8.6–10.2)
Chloride: 101 mmol/L (ref 96–106)
Creatinine, Ser: 1.17 mg/dL (ref 0.76–1.27)
GFR calc Af Amer: 73 mL/min/{1.73_m2} (ref >59)
GFR calc non Af Amer: 63 mL/min/{1.73_m2} (ref >59)
Glucose: 135 mg/dL — ABNORMAL HIGH (ref 65–99)
Potassium: 4.3 mmol/L (ref 3.5–5.2)
Sodium: 141 mmol/L (ref 134–144)

## 2020-03-29 LAB — HEPATIC FUNCTION PANEL
ALT: 29 IU/L (ref 0–44)
AST: 17 IU/L (ref 0–40)
Albumin: 4.5 g/dL (ref 3.8–4.8)
Alkaline Phosphatase: 44 IU/L (ref 44–121)
Bilirubin Total: 0.5 mg/dL (ref 0.0–1.2)
Bilirubin, Direct: 0.17 mg/dL (ref 0.00–0.40)
Total Protein: 6.9 g/dL (ref 6.0–8.5)

## 2020-03-29 MED FILL — DOXYCYCLINE HYCLATE 100 MG: 100 | 7 days supply | Qty: 14 | Fill #0

## 2020-03-29 NOTE — Telephone Encounter (Signed)
Please help patient schedule with another provider to evaluate the skin lesion.

## 2020-04-01 ENCOUNTER — Ambulatory Visit (HOSPITAL_COMMUNITY): Payer: Medicare Other

## 2020-04-04 NOTE — Progress Notes (Signed)
Cardiology Office Note:   Date:  04/06/2020  NAME:  Adam Perkins    MRN: [id number] DOB:  Dec 22, 1950   PCP:  Ronnald Nian, DO  Cardiologist:  Evalina Field, MD   Referring MD: Ronnald Nian, DO   Chief Complaint  Patient presents with   Follow-up   History of Present Illness:   Adam Perkins is a 69 y.o. male with a hx of DM, HTN, HLD, PAD who presents for follow-up. LDL still not at goal.  He reports he is doing well.  He is exercising doing walking as well as weightlifting.  He denies any chest pain or shortness of breath.  Still smoking 1 pack/day.  Apparently is going through some issues with his wife.  Reports things are stressful.  He is working out 5 days/week.  Blood pressure today is 149/81.  Slightly elevated.  He was started on hydrochlorothiazide by Dr. Donnella Bi.  Blood pressure at home runs 130/70.  I have asked him to start keeping a log.  His most recent LDL cholesterol is not at goal.  I recommend to add Zetia.  He is okay to do this.  His most recent A1c is 6.3.  He is still on aspirin and Plavix.  No bleeding issues.  We need to reach out to Dr. Alvester Chou about when we can stop his Plavix.  I will send a message to him today.  Overall, doing well.  We do need to get him to quit smoking.  He has been working on diet and exercising more.  Problem List 1. Diabetes  -A1c 6.3 2. Hypertension 3. Hyperlipidemia  -T chol 152, HDL 49, LDL 86, TG 90 4. PAD -s/p stent to occluded R common iliac artery 08/11/2019 5. Tobacco abuse (25 pack-years) 6. Aortic stenosis, mild 2021  Past Medical History: Past Medical History:  Diagnosis Date   Arthritis    Avascular necrosis of bone of right hip ([place]) 05/28/2014   Chicken pox    Diabetes mellitus without complication ([facility])    Elevated blood pressure 08/19/2012   Hyperglycemia 12/30/2015   Hyperlipemia 08/19/2012   Hypertension    Kidney stone    nephrolithiasis   Pain in limb 10/20/2013   Status post  total replacement of right hip 05/28/2014    Past Surgical History: Past Surgical History:  Procedure Laterality Date   ABDOMINAL AORTOGRAM W/LOWER EXTREMITY N/A 08/11/2019   Procedure: ABDOMINAL AORTOGRAM W/LOWER EXTREMITY;  Surgeon: Lorretta Harp, MD;  Location: [facility];  Service: Cardiovascular;  Laterality: N/A;   CHEILECTOMY Right 04/03/2019   Procedure: RIGHT DORSAL CHEILECTOMY;  Surgeon: Erle Crocker, MD;  Location: [facility];  Service: Orthopedics;  Laterality: Right;  SURGERY REQUEST TIME 90 MIN   PERIPHERAL VASCULAR INTERVENTION Right 08/11/2019   Procedure: PERIPHERAL VASCULAR INTERVENTION;  Surgeon: Lorretta Harp, MD;  Location: [facility];  Service: Cardiovascular;  Laterality: Right;  Iliac   TOTAL HIP ARTHROPLASTY Right 05/28/2014   Procedure: RIGHT TOTAL HIP ARTHROPLASTY ANTERIOR APPROACH;  Surgeon: Mcarthur Rossetti, MD;  Location: [facility];  Service: Orthopedics;  Laterality: Right;    Current Medications: Current Meds  Medication Sig   Ascorbic Acid (VITAMIN C) 1000 MG tablet Take 100 mg by mouth daily.   aspirin EC 81 MG tablet Take 81 mg by mouth daily.   clopidogrel (PLAVIX) 75 MG tablet Take 1 tablet (75 mg total) by mouth daily with breakfast.   doxycycline (VIBRA-TABS)  100 MG tablet Take 100 mg by mouth 2 (two) times daily.    hydrochlorothiazide (MICROZIDE) 12.5 MG capsule Take 1 capsule (12.5 mg total) by mouth daily.   lisinopril (ZESTRIL) 20 MG tablet Take 1 tablet (20 mg total) by mouth daily.   metFORMIN (GLUCOPHAGE XR) 500 MG 24 hr tablet Take 1 tablet (500 mg total) by mouth daily with breakfast.   selenium 50 MCG TABS tablet Take 50 mcg by mouth daily.   timolol (TIMOPTIC) 0.5 % ophthalmic solution Place 1 drop into both eyes daily.    vitamin E 200 UNIT capsule Take 200 Units by mouth daily.   Current Facility-Administered Medications for the 04/06/20 encounter (Office Visit) with  Geralynn Rile, MD  Medication   sodium chloride flush (NS) 0.9 % injection 3 mL     Allergies:    Patient has no known allergies.   Social History: Social History   Socioeconomic History   Marital status: Married    Spouse name: Not on file   Number of children: 2   Years of education: Not on file   Highest education level: Not on file  Occupational History   Not on file  Tobacco Use   Smoking status: Current Every Day Smoker    Packs/day: 1.00    Years: 25.00    Pack years: 25.00    Types: Cigarettes   Smokeless tobacco: Never Used   Tobacco comment: quit 2012 after smoking on and off a little for year. Now current smoker  Substance and Sexual Activity   Alcohol use: Yes    Alcohol/week: 0.0 standard drinks    Comment: 2 glasses of wine daily    Drug use: No   Sexual activity: Not Currently  Other Topics Concern   Not on file  Social History Narrative   Not on file   Social Determinants of Health   Financial Resource Strain: Low Risk    Difficulty of Paying Living Expenses: Not hard at all  Food Insecurity:    Worried About Charity fundraiser in the Last Year: Not on file   [employer] of Food in the Last Year: Not on file  Transportation Needs: No Transportation Needs   Lack of Transportation (Medical): No   Lack of Transportation (Non-Medical): No  Physical Activity:    Days of Exercise per Week: Not on file   Minutes of Exercise per Session: Not on file  Stress:    Feeling of Stress : Not on file  Social Connections:    Frequency of Communication with Friends and Family: Not on file   Frequency of Social Gatherings with Friends and Family: Not on file   Attends Religious Services: Not on file   Active Member of Clubs or Organizations: Not on file   Attends Archivist Meetings: Not on file   Marital Status: Not on file    Family History: The patient's family history includes Arthritis in his father; Cancer  (age of onset: 7) in his father; Diabetes in his mother.  ROS:   All other ROS reviewed and negative. Pertinent positives noted in the HPI.     EKGs/Labs/Other Studies Reviewed:   The following studies were personally reviewed by me today:  TTE 06/27/2019 1. Left ventricular ejection fraction, by visual estimation, is 60 to  65%. The left ventricle has normal function. There is mildly increased  left ventricular hypertrophy.  2. Left ventricular diastolic parameters are consistent with Grade I  diastolic dysfunction (impaired  relaxation).  3. The left ventricle has no regional wall motion abnormalities.  4. Global right ventricle has normal systolic function.The right  ventricular size is normal. No increase in right ventricular wall  thickness.  5. Left atrial size was mildly dilated.  6. Right atrial size was normal.  7. Mild mitral annular calcification.  8. The mitral valve is normal in structure. No evidence of mitral valve  regurgitation. No evidence of mitral stenosis.  9. The tricuspid valve is normal in structure.  10. The aortic valve is tricuspid. Aortic valve regurgitation is not  visualized. Mild aortic valve stenosis. Mean gradient 10 mmHg, AVA 2.0  cm^2.  11. The inferior vena cava is normal in size with greater than 50%  respiratory variability, suggesting right atrial pressure of 3 mmHg.  12. TR signal is inadequate for assessing pulmonary artery systolic  pressure.   NM Stress 06/25/2019  Nuclear stress EF: 57%. The left ventricular ejection fraction is normal (55-65%).  There was no ST segment deviation noted during stress.  This is a low risk study. There is no evidence of ischemia. There is no evidence of previous myocardial infarction.  The study is normal.   Recent Labs: 08/12/2019: Hemoglobin 12.0; Platelets 202 03/29/2020: ALT 29; BUN 20; Creatinine, Ser 1.17; Potassium 4.3; Sodium 141   Recent Lipid Panel    Component Value Date/Time    CHOL 152 03/29/2020 0906   TRIG 90 03/29/2020 0906   HDL 49 03/29/2020 0906   CHOLHDL 3.1 03/29/2020 0906   CHOLHDL 7 06/17/2019 1114   VLDL 43.0 (H) 06/17/2019 1114   LDLCALC 86 03/29/2020 0906   LDLDIRECT 220.0 06/17/2019 1114    Physical Exam:   VS:  BP (!) 149/81    Pulse 63    Ht 5\' 9"  (1.753 m)    Wt 181 lb 3.2 oz (82.2 kg)    SpO2 99%    BMI 26.76 kg/m    Wt Readings from Last 3 Encounters:  04/06/20 181 lb 3.2 oz (82.2 kg)  03/25/20 183 lb 3.2 oz (83.1 kg)  03/17/20 184 lb (83.5 kg)    General: Well nourished, well developed, in no acute distress Heart: Atraumatic, normal size  Eyes: PEERLA, EOMI  Neck: Supple, no JVD Endocrine: No thryomegaly Cardiac: Normal S1, S2; RRR; 2 out of 6 systolic ejection murmur Lungs: Clear to auscultation bilaterally, no wheezing, rhonchi or rales  Abd: Soft, nontender, no hepatomegaly  Ext: No edema, pulses 2+ Musculoskeletal: No deformities, BUE and BLE strength normal and equal Skin: Warm and dry, no rashes   Neuro: Alert and oriented to person, place, time, and situation, CNII-XII grossly intact, no focal deficits  Psych: Normal mood and affect   ASSESSMENT:   Adam Perkins is a 69 y.o. male who presents for the following: 1. Peripheral arterial disease ([place])   2. Hyperlipidemia associated with type 2 diabetes mellitus ([place])   3. Primary hypertension   4. Aortic stenosis, mild   5. Tobacco abuse     PLAN:   1. Peripheral arterial disease ([place]) 2. Hyperlipidemia associated with type 2 diabetes mellitus ([place]) 3. HTN -Status post stenting peripherally in February 2021.  Remains on aspirin Plavix.  I will reach out to Dr. Alvester Chou about when he can remain on aspirin only. -No symptoms. -Echo with normal LV function.  Negative cardiac stress test. -LDL not at goal.  Continue Crestor 40 mg daily.  We will add Zetia.  We will recheck a fasting lipid profile  in 2 months.  I will see him back in 6 months.  We can manage his lipids by the  phone. -Blood pressure a bit elevated.  149/81.  He will continue his hydrochlorothiazide and lisinopril.  He will start to keep a log.  4. Aortic stenosis, mild -Mild.  Faint murmur.  Repeat echocardiogram in 3 to 5 years.  5. Tobacco abuse -Smoking cessation counseling provided.  He needs to quit smoking.  He has significant PAD.  Disposition: Return in about 6 months (around 10/05/2020).  Medication Adjustments/Labs and Tests Ordered: Current medicines are reviewed at length with the patient today.  Concerns regarding medicines are outlined above.  Orders Placed This Encounter  Procedures   Lipid panel   Meds ordered this encounter  Medications   ezetimibe (ZETIA) 10 MG tablet    Sig: Take 1 tablet (10 mg total) by mouth daily.    Dispense:  90 tablet    Refill:  3    Patient Instructions  Medication Instructions:  START zetia 10mg  daily Continue all other current medications  *If you need a refill on your cardiac medications before your next appointment, please call your pharmacy*   Lab Work: FASTING lipid panel in 2 months  If you have labs (blood work) drawn today and your tests are completely normal, you will receive your results only by:  [place] (if you have MyChart) OR  A paper copy in the mail If you have any lab test that is abnormal or we need to change your treatment, we will call you to review the results.   Testing/Procedures: NONE   Follow-Up: At [facility], you and your health needs are our priority.  As part of our continuing mission to provide you with exceptional heart care, we have created designated Provider Care Teams.  These Care Teams include your primary Cardiologist (physician) and Advanced Practice Providers (APPs -  Physician Assistants and Nurse Practitioners) who all work together to provide you with the care you need, when you need it.  We recommend signing up for the patient portal called "MyChart".  Sign up information  is provided on this After Visit Summary.  MyChart is used to connect with patients for Virtual Visits (Telemedicine).  Patients are able to view lab/test results, encounter notes, upcoming appointments, etc.  Non-urgent messages can be sent to your provider as well.   To learn more about what you can do with MyChart, go to [email or web address].    Your next appointment:   6 month(s)  The format for your next appointment:   In Person  Provider:   You may see Evalina Field, MD or one of the following Advanced Practice Providers on your designated Care Team:    Almyra Deforest, PA-C  Fabian Sharp, [place] or   Roby Lofts, [place]    Other Instructions      Time Spent with Patient: I have spent a total of 35 minutes with patient reviewing hospital notes, telemetry, EKGs, labs and examining the patient as well as establishing an assessment and plan that was discussed with the patient.  > 50% of time was spent in direct patient care.  Signed, Addison Naegeli. Audie Box, [place]  [address] [place], [place] [address] [phone]  04/06/2020 10:56 AM

## 2020-04-06 ENCOUNTER — Encounter: Payer: Self-pay | Admitting: Cardiovascular Disease

## 2020-04-06 ENCOUNTER — Ambulatory Visit (INDEPENDENT_AMBULATORY_CARE_PROVIDER_SITE_OTHER): Payer: Medicare Other | Admitting: Cardiovascular Disease

## 2020-04-06 ENCOUNTER — Other Ambulatory Visit: Payer: Self-pay

## 2020-04-06 VITALS — BP 149/81 | HR 63 | Ht 69.0 in | Wt 181.2 lb

## 2020-04-06 DIAGNOSIS — I10 Essential (primary) hypertension: Secondary | ICD-10-CM

## 2020-04-06 DIAGNOSIS — I739 Peripheral vascular disease, unspecified: Secondary | ICD-10-CM

## 2020-04-06 DIAGNOSIS — I350 Nonrheumatic aortic (valve) stenosis: Secondary | ICD-10-CM | POA: Diagnosis not present

## 2020-04-06 DIAGNOSIS — E1169 Type 2 diabetes mellitus with other specified complication: Secondary | ICD-10-CM | POA: Diagnosis not present

## 2020-04-06 DIAGNOSIS — E785 Hyperlipidemia, unspecified: Secondary | ICD-10-CM

## 2020-04-06 DIAGNOSIS — Z720 Tobacco use: Secondary | ICD-10-CM | POA: Diagnosis not present

## 2020-04-06 MED ORDER — EZETIMIBE 10 MG PO TABS: 10.0000 mg | ORAL_TABLET | Freq: Every day | ORAL | 3 refills | Status: DC

## 2020-04-06 NOTE — Patient Instructions (Signed)
Medication Instructions:  START zetia 10mg  daily Continue all other current medications  *If you need a refill on your cardiac medications before your next appointment, please call your pharmacy*   Lab Work: FASTING lipid panel in 2 months  If you have labs (blood work) drawn today and your tests are completely normal, you will receive your results only by: Marland Kitchen MyChart Message (if you have MyChart) OR . A paper copy in the mail If you have any lab test that is abnormal or we need to change your treatment, we will call you to review the results.   Testing/Procedures: NONE   Follow-Up: At [facility], you and your health needs are our priority.  As part of our continuing mission to provide you with exceptional heart care, we have created designated Provider Care Teams.  These Care Teams include your primary Cardiologist (physician) and Advanced Practice Providers (APPs -  Physician Assistants and Nurse Practitioners) who all work together to provide you with the care you need, when you need it.  We recommend signing up for the patient portal called "MyChart".  Sign up information is provided on this After Visit Summary.  MyChart is used to connect with patients for Virtual Visits (Telemedicine).  Patients are able to view lab/test results, encounter notes, upcoming appointments, etc.  Non-urgent messages can be sent to your provider as well.   To learn more about what you can do with MyChart, go to [email or web address].    Your next appointment:   6 month(s)  The format for your next appointment:   In Person  Provider:   You may see Evalina Field, MD or one of the following Advanced Practice Providers on your designated Care Team:    Almyra Deforest, PA-C  Fabian Sharp, [place] or   Roby Lofts, [place]    Other Instructions

## 2020-04-13 ENCOUNTER — Ambulatory Visit (HOSPITAL_COMMUNITY)
Admission: RE | Admit: 2020-04-13 | Discharge: 2020-04-13 | Disposition: A | Discharge status: Home / Self Care | Payer: Medicare Other | Source: Ambulatory Visit | Attending: Cardiovascular Disease | Admitting: Cardiovascular Disease

## 2020-04-13 ENCOUNTER — Other Ambulatory Visit: Payer: Self-pay

## 2020-04-13 DIAGNOSIS — R0989 Other specified symptoms and signs involving the circulatory and respiratory systems: Secondary | ICD-10-CM | POA: Insufficient documentation

## 2020-04-22 ENCOUNTER — Telehealth: Payer: Self-pay | Admitting: Family Medicine

## 2020-04-22 NOTE — Telephone Encounter (Signed)
Patient is calling and stated that he wanted to see if Dr. Loletha Grayer can refer him to see a urologist, please advise. CB is [phone]

## 2020-04-23 NOTE — Telephone Encounter (Signed)
lft VM to rtn call to get more information on why the referral is needed.   Dm/cma

## 2020-04-26 DIAGNOSIS — R310 Gross hematuria: Secondary | ICD-10-CM | POA: Diagnosis not present

## 2020-04-27 ENCOUNTER — Telehealth: Payer: Self-pay

## 2020-04-27 DIAGNOSIS — E119 Type 2 diabetes mellitus without complications: Secondary | ICD-10-CM

## 2020-04-27 NOTE — Progress Notes (Signed)
Chronic Care Management Pharmacy Assistant   Name: WINTER TREFZ  MRN: [id number] DOB: 04/21/1951  Reason for Encounter: Medication Review  PCP : Ronnald Nian, DO  Allergies:  No Known Allergies  Medications: Outpatient Encounter Medications as of 04/27/2020  Medication Sig  . Ascorbic Acid (VITAMIN C) 1000 MG tablet Take 100 mg by mouth daily.  Marland Kitchen aspirin EC 81 MG tablet Take 81 mg by mouth daily.  . clopidogrel (PLAVIX) 75 MG tablet Take 1 tablet (75 mg total) by mouth daily with breakfast.  . doxycycline (VIBRA-TABS) 100 MG tablet Take 100 mg by mouth 2 (two) times daily.   Marland Kitchen ezetimibe (ZETIA) 10 MG tablet Take 1 tablet (10 mg total) by mouth daily.  . hydrochlorothiazide (MICROZIDE) 12.5 MG capsule Take 1 capsule (12.5 mg total) by mouth daily.  Marland Kitchen lisinopril (ZESTRIL) 20 MG tablet Take 1 tablet (20 mg total) by mouth daily.  . metFORMIN (GLUCOPHAGE XR) 500 MG 24 hr tablet Take 1 tablet (500 mg total) by mouth daily with breakfast.  . rosuvastatin (CRESTOR) 40 MG tablet Take 1 tablet (40 mg total) by mouth daily.  Marland Kitchen selenium 50 MCG TABS tablet Take 50 mcg by mouth daily.  . timolol (TIMOPTIC) 0.5 % ophthalmic solution Place 1 drop into both eyes daily.   . vitamin E 200 UNIT capsule Take 200 Units by mouth daily.   Facility-Administered Encounter Medications as of 04/27/2020  Medication  . sodium chloride flush (NS) 0.9 % injection 3 mL    Current Diagnosis: Patient Active Problem List   Diagnosis Date Noted  . Pain due to onychomycosis of toenails of both feet 09/05/2019  . Claudication in peripheral vascular disease ([place]) 08/11/2019  . Peripheral arterial disease ([place]) 07/23/2019  . Controlled type 2 diabetes mellitus without complication, without long-term current use of insulin ([place]) 12/30/2015  . 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 Osteoarthritis 06/03/2013  . Hyperlipidemia associated with type 2 diabetes mellitus ([place]) 08/19/2012  . HTN  (hypertension) 08/19/2012   Follow-Up:  Pharmacist Review   Reviewed chart for medication changes ahead of medication coordination call.  OVs, Consults, or hospital visits since last care coordination call/Pharmacist visit.   04/06/20 Cardiology  Medication changes indicated  04/06/20 Started Zetia 10 MG Tablet,Cardiology  BP Readings from Last 3 Encounters:  04/06/20 (!) 149/81  03/25/20 (!) 142/84  03/17/20 (!) 152/60    Lab Results  Component Value Date   HGBA1C 6.3 01/14/2020     Patient obtains medications through Vials  90 Days   Last adherence delivery included: (medication name and frequency)  None ID Patient declined (meds) last month due to PRN use/additional supply on hand.  Vitamin C 1000 mg (Adequate supply-OTC) Selenium (Adequate supply-OTC) Timolol (Adequate supply-OTC)      Vitamin E 200 units (Adequate supply-OTC)  Rosuvastatin 40 mgdaily Adequate supply Clopidogrel 75 mgdaily Adequate supply- Metformin ER 500 mgdaily Adequate supply Lisinopril20 mgdailyAdequate supply Aspirin 81 mgdailyAdequate supply  Patient is due for next adherence delivery on: 05/04/2020. Called patient and reviewed medications and coordinated delivery.  This delivery to include:No fill needed  Patient declined the following medications (meds) due to (reason)  Vitamin C 1000 mg (Adequate supply-OTC) Selenium (Adequate supply-OTC) Timolol (Adequate supply-OTC)      Vitamin E 200 units (Adequate supply-OTC)  Rosuvastatin 40 mgdaily Adequate supply Clopidogrel 75 mgdaily Adequate supply- Metformin ER 500 mgdaily Adequate supply Lisinopril20 mgdailyAdequate supply Aspirin 81 mgdailyAdequate supply  Patient needs refills for  None ID.  Confirmed delivery date of None ID , advised patient that pharmacy will contact them the  morning of delivery.  [place] Pharmacist Assistant [phone]

## 2020-04-28 NOTE — Telephone Encounter (Signed)
Spoke to patient and he states that he has already seen Dr Louis Meckel w/ [facility] last week.  So no referral needed.  Dm/cma

## 2020-05-07 DIAGNOSIS — R310 Gross hematuria: Secondary | ICD-10-CM | POA: Diagnosis not present

## 2020-05-10 DIAGNOSIS — R310 Gross hematuria: Secondary | ICD-10-CM | POA: Diagnosis not present

## 2020-05-10 DIAGNOSIS — N200 Calculus of kidney: Secondary | ICD-10-CM | POA: Diagnosis not present

## 2020-05-17 DIAGNOSIS — H40053 Ocular hypertension, bilateral: Secondary | ICD-10-CM | POA: Diagnosis not present

## 2020-05-17 DIAGNOSIS — E119 Type 2 diabetes mellitus without complications: Secondary | ICD-10-CM | POA: Diagnosis not present

## 2020-05-18 ENCOUNTER — Telehealth: Payer: Self-pay

## 2020-05-18 NOTE — Progress Notes (Signed)
    Chronic Care Management Pharmacy Assistant   Name: Adam Perkins  MRN: [id number] DOB: 1950-08-12  Reason for Encounter: Medication Review   PCP : Ronnald Nian, DO  Allergies:  No Known Allergies  Medications: Outpatient Encounter Medications as of 05/18/2020  Medication Sig  . Ascorbic Acid (VITAMIN C) 1000 MG tablet Take 100 mg by mouth daily.  Marland Kitchen aspirin EC 81 MG tablet Take 81 mg by mouth daily.  . clopidogrel (PLAVIX) 75 MG tablet Take 1 tablet (75 mg total) by mouth daily with breakfast.  . doxycycline (VIBRA-TABS) 100 MG tablet Take 100 mg by mouth 2 (two) times daily.   Marland Kitchen ezetimibe (ZETIA) 10 MG tablet Take 1 tablet (10 mg total) by mouth daily.  . hydrochlorothiazide (MICROZIDE) 12.5 MG capsule Take 1 capsule (12.5 mg total) by mouth daily.  Marland Kitchen lisinopril (ZESTRIL) 20 MG tablet Take 1 tablet (20 mg total) by mouth daily.  . metFORMIN (GLUCOPHAGE XR) 500 MG 24 hr tablet Take 1 tablet (500 mg total) by mouth daily with breakfast.  . rosuvastatin (CRESTOR) 40 MG tablet Take 1 tablet (40 mg total) by mouth daily.  Marland Kitchen selenium 50 MCG TABS tablet Take 50 mcg by mouth daily.  . timolol (TIMOPTIC) 0.5 % ophthalmic solution Place 1 drop into both eyes daily.   . vitamin E 200 UNIT capsule Take 200 Units by mouth daily.   Facility-Administered Encounter Medications as of 05/18/2020  Medication  . sodium chloride flush (NS) 0.9 % injection 3 mL    Current Diagnosis: Patient Active Problem List   Diagnosis Date Noted  . Pain due to onychomycosis of toenails of both feet 09/05/2019  . Claudication in peripheral vascular disease ([place]) 08/11/2019  . Peripheral arterial disease ([place]) 07/23/2019  . Controlled type 2 diabetes mellitus without complication, without long-term current use of insulin ([place]) 12/30/2015  . 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 Osteoarthritis 06/03/2013  . Hyperlipidemia associated with type 2 diabetes mellitus ([place]) 08/19/2012  .  HTN (hypertension) 08/19/2012      Follow-Up:  Pharmacist Review   Reviewed chart and adherence measures. Per insurance data patient is 100 % adherent to Metformin Lisinopril and Rosuvastatin . Patient does meet goal for to Metformin Lisinopril and Rosuvastatin.    [place] Pharmacist Assistant [phone]

## 2020-05-20 DIAGNOSIS — Z85828 Personal history of other malignant neoplasm of skin: Secondary | ICD-10-CM | POA: Diagnosis not present

## 2020-05-20 DIAGNOSIS — L570 Actinic keratosis: Secondary | ICD-10-CM | POA: Diagnosis not present

## 2020-05-20 DIAGNOSIS — L821 Other seborrheic keratosis: Secondary | ICD-10-CM | POA: Diagnosis not present

## 2020-05-20 DIAGNOSIS — L820 Inflamed seborrheic keratosis: Secondary | ICD-10-CM | POA: Diagnosis not present

## 2020-05-25 ENCOUNTER — Telehealth: Payer: Self-pay

## 2020-05-25 NOTE — Progress Notes (Signed)
Chronic Care Management Pharmacy Assistant   Name: Adam Perkins  MRN: [id number] DOB: September 08, 1950  Reason for Encounter: Medication Review  PCP : Ronnald Nian, DO  Allergies:  No Known Allergies  Medications: Outpatient Encounter Medications as of 05/25/2020  Medication Sig   Ascorbic Acid (VITAMIN C) 1000 MG tablet Take 100 mg by mouth daily.   aspirin EC 81 MG tablet Take 81 mg by mouth daily.   clopidogrel (PLAVIX) 75 MG tablet Take 1 tablet (75 mg total) by mouth daily with breakfast.   doxycycline (VIBRA-TABS) 100 MG tablet Take 100 mg by mouth 2 (two) times daily.    ezetimibe (ZETIA) 10 MG tablet Take 1 tablet (10 mg total) by mouth daily.   hydrochlorothiazide (MICROZIDE) 12.5 MG capsule Take 1 capsule (12.5 mg total) by mouth daily.   lisinopril (ZESTRIL) 20 MG tablet Take 1 tablet (20 mg total) by mouth daily.   metFORMIN (GLUCOPHAGE XR) 500 MG 24 hr tablet Take 1 tablet (500 mg total) by mouth daily with breakfast.   rosuvastatin (CRESTOR) 40 MG tablet Take 1 tablet (40 mg total) by mouth daily.   selenium 50 MCG TABS tablet Take 50 mcg by mouth daily.   timolol (TIMOPTIC) 0.5 % ophthalmic solution Place 1 drop into both eyes daily.    vitamin E 200 UNIT capsule Take 200 Units by mouth daily.   Facility-Administered Encounter Medications as of 05/25/2020  Medication   sodium chloride flush (NS) 0.9 % injection 3 mL    Current Diagnosis: Patient Active Problem List   Diagnosis Date Noted   Pain due to onychomycosis of toenails of both feet 09/05/2019   Claudication in peripheral vascular disease ([place]) 08/11/2019   Peripheral arterial disease ([place]) 07/23/2019   Controlled type 2 diabetes mellitus without complication, without long-term current use of insulin ([place]) 12/30/2015   Status post total replacement of right hip 05/28/2014   Osteoarthritis 06/03/2013   Hyperlipidemia associated with type 2 diabetes mellitus ([place]) 08/19/2012   HTN  (hypertension) 08/19/2012     Follow-Up:  Pharmacist Review   Reviewed chart for medication changes ahead of medication coordination call.  No OVs, Consults, or hospital visits since last care coordination call/Pharmacist visit.   No medication changes indicated  BP Readings from Last 3 Encounters:  04/06/20 (!) 149/81  03/25/20 (!) 142/84  03/17/20 (!) 152/60    Lab Results  Component Value Date   HGBA1C 6.3 01/14/2020     Patient obtains medications through Vials  90 Days   Last adherence delivery included:   None ID  Patient declined (meds) last month due to PRN use/additional supply on hand.  Vitamin C 1000 mg (Adequate supply-OTC) Selenium (Adequate supply-OTC) Timolol (Adequate supply-OTC) Vitamin E 200 units (Adequate supply-OTC)             Rosuvastatin 40 mgdaily Adequate supply Clopidogrel 75 mgdaily Adequate supply- Metformin ER 500 mgdaily Adequate supply Lisinopril20 mgdailyAdequate supply Aspirin 81 mgdailyAdequate supply  Patient is due for next adherence delivery on: 06/03/2020. Called patient and reviewed medications and coordinated delivery.  This delivery to include:    Rosuvastatin 40 mgdaily Clopidogrel 75 mgdaily  Metformin ER 500 mgdaily Lisinopril20 mgdaily Aspirin 81 mgdaily  Hydrochlorothiazide 12.5 MG  Daily  Ezetimibe 10 MG Daily  Patient declined the following medications (meds) due to (reason)  Vitamin C 1000 mg (Adequate supply-OTC) Selenium (Adequate supply-OTC) Timolol (Adequate supply-OTC) Vitamin E 200 units (Adequate supply-OTC)  Patient needs refills for None ID.  Confirmed delivery date of 06/03/2020, advised patient that pharmacy will contact them the morning of delivery.  [place] Pharmacist Assistant [phone]

## 2020-07-12 MED FILL — TIMOLOL 0.5% EYE DROPS: 0.5 | 75 days supply | Qty: 10 | Fill #1

## 2020-07-26 ENCOUNTER — Telehealth: Payer: Self-pay

## 2020-07-26 NOTE — Progress Notes (Signed)
Chronic Care Management Pharmacy Assistant   Name: Adam Perkins  MRN: [id number] DOB: 21-Jun-1950  Reason for Encounter: Medication Review  Patient Questions:  1.  Have you seen any other providers since your last visit? No  2.  Any changes in your medicines or health? No   PCP : Ronnald Nian, DO  Allergies:  No Known Allergies  Medications: Outpatient Encounter Medications as of 07/26/2020  Medication Sig  . Ascorbic Acid (VITAMIN C) 1000 MG tablet Take 100 mg by mouth daily.  Marland Kitchen aspirin EC 81 MG tablet Take 81 mg by mouth daily.  . clopidogrel (PLAVIX) 75 MG tablet Take 1 tablet (75 mg total) by mouth daily with breakfast.  . doxycycline (VIBRA-TABS) 100 MG tablet Take 100 mg by mouth 2 (two) times daily.   Marland Kitchen ezetimibe (ZETIA) 10 MG tablet Take 1 tablet (10 mg total) by mouth daily.  . hydrochlorothiazide (MICROZIDE) 12.5 MG capsule Take 1 capsule (12.5 mg total) by mouth daily.  Marland Kitchen lisinopril (ZESTRIL) 20 MG tablet Take 1 tablet (20 mg total) by mouth daily.  . metFORMIN (GLUCOPHAGE XR) 500 MG 24 hr tablet Take 1 tablet (500 mg total) by mouth daily with breakfast.  . rosuvastatin (CRESTOR) 40 MG tablet Take 1 tablet (40 mg total) by mouth daily.  Marland Kitchen selenium 50 MCG TABS tablet Take 50 mcg by mouth daily.  . timolol (TIMOPTIC) 0.5 % ophthalmic solution Place 1 drop into both eyes daily.   . vitamin E 200 UNIT capsule Take 200 Units by mouth daily.   Facility-Administered Encounter Medications as of 07/26/2020  Medication  . sodium chloride flush (NS) 0.9 % injection 3 mL    Current Diagnosis: Patient Active Problem List   Diagnosis Date Noted  . Pain due to onychomycosis of toenails of both feet 09/05/2019  . Claudication in peripheral vascular disease ([place]) 08/11/2019  . Peripheral arterial disease ([place]) 07/23/2019  . Controlled type 2 diabetes mellitus without complication, without long-term current use of insulin ([place]) 12/30/2015  . Status post total replacement  of right hip 05/28/2014  . Osteoarthritis 06/03/2013  . Hyperlipidemia associated with type 2 diabetes mellitus ([place]) 08/19/2012  . HTN (hypertension) 08/19/2012    Goals Addressed   None    Reviewed chart for medication changes ahead of medication coordination call.  No OVs, Consults, or hospital visits since last care coordination call/Pharmacist visit. No medication changes indicated .  BP Readings from Last 3 Encounters:  04/06/20 (!) 149/81  03/25/20 (!) 142/84  03/17/20 (!) 152/60    Lab Results  Component Value Date   HGBA1C 6.3 01/14/2020     Patient obtains medications through Vials  90 Days   Last adherence delivery included:  Rosuvastatin 40 mgdaily Clopidogrel 75 mgdaily  Metformin ER 500 mgdaily Lisinopril20 mgdaily Aspirin 81 mgdaily             Hydrochlorothiazide 12.5 MG  Daily             Ezetimibe 10 MG Daily  Patient declined medication last month:  Vitamin C 1000 mg (Adequate supply-OTC) Selenium (Adequate supply-OTC) Timolol (Adequate supply-OTC) Vitamin E 200 units (Adequate supply-OTC)   Patient is due for next adherence delivery on: 08/02/2020.  Called patient and reviewed medications and coordinated delivery.  This delivery to include:None ID   Patient will not need a short fill of medication, prior to adherence delivery.  Patient will not need a  Acute fill of medication, prior to adherence delivery.  Patient declined the following medications:    Vitamin C 1000 mg (Adequate supply-OTC) Selenium (Adequate supply-OTC) Timolol (Adequate supply-OTC) Vitamin E 200 units (Adequate supply-OTC) Rosuvastatin 40 mgdaily Adequate supply Clopidogrel 75 mgdaily Adequate supply- Metformin ER 500 mgdaily Adequate supply Lisinopril20 mgdailyAdequate supply Aspirin 81  mgdailyAdequate supply  Hydrochlorothiazide 12.5 MG  Daily Adequate supply  Patient needs refills for None ID.  Confirmed delivery date of No Fill Needed, advised patient that pharmacy will contact them the morning of delivery.  Follow-Up:  Pharmacist Review   [place] [facility] Pharmacist Assistant [phone] (40 min)

## 2020-08-04 ENCOUNTER — Telehealth: Payer: Self-pay | Admitting: Family Medicine

## 2020-08-04 NOTE — Telephone Encounter (Signed)
Attempted to schedule AWV. Unable to LVM.  Will try at later time.   Called patient to schedule Annual Wellness Visit.  Please schedule with Nurse Health Advisor Martha Stanley, RN at [facility]  

## 2020-08-17 ENCOUNTER — Telehealth: Payer: Medicare Other

## 2020-08-20 ENCOUNTER — Other Ambulatory Visit: Payer: Self-pay | Admitting: Cardiovascular Disease

## 2020-08-23 ENCOUNTER — Telehealth: Payer: Self-pay

## 2020-08-23 NOTE — Progress Notes (Signed)
Chronic Care Management Pharmacy Assistant   Name: Adam Perkins  MRN: [id number] DOB: [birth year]   Reason for Encounter: Medication Review    Primary concerns for visit include: Dispensing medication   Recent office visits:  No recent office visit  Recent consult visits:  No recent consult visit  Hospital visits:  None in previous 6 months  Medications: Outpatient Encounter Medications as of 08/23/2020  Medication Sig   rosuvastatin (CRESTOR) 40 MG tablet TAKE ONE TABLET BY MOUTH ONCE DAILY   Ascorbic Acid (VITAMIN C) 1000 MG tablet Take 100 mg by mouth daily.   aspirin EC 81 MG tablet Take 81 mg by mouth daily.   clopidogrel (PLAVIX) 75 MG tablet TAKE ONE TABLET BY MOUTH EVERY MORNING WITH BREAKFAST   doxycycline (VIBRA-TABS) 100 MG tablet Take 100 mg by mouth 2 (two) times daily.    ezetimibe (ZETIA) 10 MG tablet Take 1 tablet (10 mg total) by mouth daily.   hydrochlorothiazide (MICROZIDE) 12.5 MG capsule Take 1 capsule (12.5 mg total) by mouth daily.   lisinopril (ZESTRIL) 20 MG tablet Take 1 tablet (20 mg total) by mouth daily.   metFORMIN (GLUCOPHAGE XR) 500 MG 24 hr tablet Take 1 tablet (500 mg total) by mouth daily with breakfast.   selenium 50 MCG TABS tablet Take 50 mcg by mouth daily.   timolol (TIMOPTIC) 0.5 % ophthalmic solution Place 1 drop into both eyes daily.    vitamin E 200 UNIT capsule Take 200 Units by mouth daily.   Facility-Administered Encounter Medications as of 08/23/2020  Medication   sodium chloride flush (NS) 0.9 % injection 3 mL    Care Gaps:  Patient is due for the following care GAPS: Ophthalmology Exam, Colonoscopy, influenza vaccine, COVID - 19 (3-Booster) PNA Vac, A1C   Star Rating Drugs:   Rosuvastatin 40 mg Tablet, lisinopril 20 mg tablet, metformin 500 mg tablet  Reviewed chart for medication changes ahead of medication coordination call.  No OVs, Consults, or hospital visits since last care coordination  call/Pharmacist visit.   No medication changes indicated  BP Readings from Last 3 Encounters:  04/06/20 (!) 149/81  03/25/20 (!) 142/84  03/17/20 (!) 152/60    Lab Results  Component Value Date   HGBA1C 6.3 01/14/2020     Patient obtains medications through Vials  90 Days   Last adherence delivery included:  None ID   Patient declined medication  last month:    Vitamin C 1000 mg (Adequate supply-OTC) Selenium (Adequate supply-OTC) Timolol (Adequate supply-OTC) Vitamin E 200 units (Adequate supply-OTC) Rosuvastatin 40 mgdaily Adequate supply Clopidogrel 75 mgdaily Adequate supply Metformin ER 500 mgdaily Adequate supply Lisinopril20 mgdailyAdequate supply Aspirin 81 mgdailyAdequate supply             Hydrochlorothiazide 12.5 MG Daily Adequate supply  Patient is due for next adherence delivery on: 08/30/2020. Called patient and reviewed medications and coordinated delivery.  This delivery to include:  Rosuvastatin 40 mg one tabletdaily  Clopidogrel 75 mgone tablet daily  Metformin ER 500 mg one tablet daily  Lisinopril20 mg one tablet  daily Aspirin 81 mg one tablet daily             Hydrochlorothiazide 12.5 MG one CapsuleDaily  Ezetimibe 10 mg one tablet daily  Patient will not need a short fill of medication , prior to adherence delivery. Patient will not need a  acute fill of medication, prior to adherence delivery.  Patient declined the following medications:  Vitamin C 1000  mg (Adequate supply-OTC) Selenium (Adequate supply-OTC) Timolol (Adequate supply-OTC) Vitamin E 200 units (Adequate supply-OTC)  Patient needs refills for None ID .  Confirmed delivery date of 08/30/2020, advised patient that pharmacy will contact them the morning of delivery.  [place] Pharmacist Assistant [phone]

## 2020-08-24 ENCOUNTER — Encounter: Payer: Self-pay | Admitting: Gastroenterology

## 2020-09-16 ENCOUNTER — Telehealth: Payer: Self-pay

## 2020-09-16 NOTE — Progress Notes (Signed)
Left voice message  to confirmed patient telephone appointment on 09/17/2020 for CCM at 1:00pm with Junius Argyle the Clinical pharmacist.   [place] Pharmacist Assistant [phone]

## 2020-09-17 ENCOUNTER — Telehealth: Payer: Medicare Other

## 2020-09-21 ENCOUNTER — Telehealth: Payer: Self-pay

## 2020-09-21 NOTE — Progress Notes (Signed)
Chronic Care Management Pharmacy Assistant   Name: Adam Perkins  MRN: [id number] DOB: May 05, 1951  Reason for Encounter: Medication Review/medication coordination call.   Recent office visits:  No recent office visits  Recent consult visits:  No recent [facility] visits:  None in previous 6 months  Medications: Outpatient Encounter Medications as of 09/21/2020  Medication Sig  . Ascorbic Acid (VITAMIN C) 1000 MG tablet Take 100 mg by mouth daily.  Marland Kitchen aspirin EC 81 MG tablet Take 81 mg by mouth daily.  . clopidogrel (PLAVIX) 75 MG tablet TAKE ONE TABLET BY MOUTH EVERY MORNING WITH BREAKFAST  . doxycycline (VIBRA-TABS) 100 MG tablet Take 100 mg by mouth 2 (two) times daily.   Marland Kitchen doxycycline (VIBRA-TABS) 100 MG tablet TAKE 1 TABLET BY MOUTH TWICE A DAY  . ezetimibe (ZETIA) 10 MG tablet Take 1 tablet (10 mg total) by mouth daily.  . hydrochlorothiazide (MICROZIDE) 12.5 MG capsule Take 1 capsule (12.5 mg total) by mouth daily.  Marland Kitchen lisinopril (ZESTRIL) 20 MG tablet Take 1 tablet (20 mg total) by mouth daily.  . metFORMIN (GLUCOPHAGE XR) 500 MG 24 hr tablet Take 1 tablet (500 mg total) by mouth daily with breakfast.  . rosuvastatin (CRESTOR) 40 MG tablet TAKE ONE TABLET BY MOUTH ONCE DAILY  . selenium 50 MCG TABS tablet Take 50 mcg by mouth daily.  . timolol (TIMOPTIC) 0.5 % ophthalmic solution Place 1 drop into both eyes daily.   . vitamin E 200 UNIT capsule Take 200 Units by mouth daily.   Facility-Administered Encounter Medications as of 09/21/2020  Medication  . sodium chloride flush (NS) 0.9 % injection 3 mL    Star Rating Drugs: Lisinopril 20 mg last filled on 08/30/2020 [facility]. Metformin 500 mg last filled on 08/30/2020 for [facility] day supply [facility] Rosuvastatin 40 mg  last filled on 08/30/2020 for [facility]  Reviewed chart for medication changes ahead of medication coordination call.  BP  Readings from Last 3 Encounters:  04/06/20 (!) 149/81  03/25/20 (!) 142/84  03/17/20 (!) 152/60    Lab Results  Component Value Date   HGBA1C 6.3 01/14/2020     Patient obtains medications through Vials  90 Days   Last adherence delivery included:  Rosuvastatin 40 mg one tabletdaily   Clopidogrel 75 mgone tablet daily   Metformin ER 500 mg one tablet daily   Lisinopril20 mg one tablet  daily  Aspirin 81 mg one tablet daily  Hydrochlorothiazide 12.5 MG one CapsuleDaily  Ezetimibe 10 mg one tablet daily  Patient declined medications last month:  Vitamin C 1000 mg (Adequate supply-OTC)  Selenium (Adequate supply-OTC)  Timolol (Adequate supply-OTC)  Vitamin E 200 units (Adequate supply-OTC)  Patient is due for next adherence delivery on: 09/29/2020. Called patient and reviewed medications and coordinated delivery.   This delivery to include:None ID   Patient declined the following medications patient given 90 day supply on 08/30/2020   Rosuvastatin 40 mg one tabletdaily   Clopidogrel 75 mgone tablet daily   Metformin ER 500 mg one tablet daily   Lisinopril20 mg one tablet  daily  Aspirin 81 mg one tablet daily  Hydrochlorothiazide 12.5 MG one CapsuleDaily  Ezetimibe 10 mg one tablet daily  Patient needs refills for None ID.  Confirmed delivery date of No Fill Needed , advised patient that pharmacy will contact them the morning of delivery.  [place] Pharmacist  Assistant [phone]

## 2020-11-19 ENCOUNTER — Telehealth: Payer: Self-pay | Admitting: Family Medicine

## 2020-11-19 ENCOUNTER — Telehealth: Payer: Self-pay

## 2020-11-19 NOTE — Telephone Encounter (Signed)
Attempted to reach patient, but no answer. If patient calls back, please schedule Medicare Annual Wellness Visit (AWV).   Please offer to do virtually or by telephone.   Due AWVI  Please schedule at anytime with Nurse Health Advisor.

## 2020-11-19 NOTE — Progress Notes (Signed)
    Chronic Care Management Pharmacy Assistant   Name: Adam Perkins  MRN: [id number] DOB: September 10, 1950  Reason for Encounter: Medication Review/Medication Coordination Call.   Recent office visits:  No recent Office Visit  Recent consult visits:  No recent [facility] visits:  None in previous 6 months  Medications: Outpatient Encounter Medications as of 11/19/2020  Medication Sig  . Ascorbic Acid (VITAMIN C) 1000 MG tablet Take 100 mg by mouth daily.  Marland Kitchen aspirin EC 81 MG tablet Take 81 mg by mouth daily.  . clopidogrel (PLAVIX) 75 MG tablet TAKE ONE TABLET BY MOUTH EVERY MORNING WITH BREAKFAST  . doxycycline (VIBRA-TABS) 100 MG tablet Take 100 mg by mouth 2 (two) times daily.   Marland Kitchen doxycycline (VIBRA-TABS) 100 MG tablet TAKE 1 TABLET BY MOUTH TWICE A DAY  . ezetimibe (ZETIA) 10 MG tablet Take 1 tablet (10 mg total) by mouth daily.  . hydrochlorothiazide (MICROZIDE) 12.5 MG capsule Take 1 capsule (12.5 mg total) by mouth daily.  Marland Kitchen lisinopril (ZESTRIL) 20 MG tablet Take 1 tablet (20 mg total) by mouth daily.  . metFORMIN (GLUCOPHAGE XR) 500 MG 24 hr tablet Take 1 tablet (500 mg total) by mouth daily with breakfast.  . rosuvastatin (CRESTOR) 40 MG tablet TAKE ONE TABLET BY MOUTH ONCE DAILY  . selenium 50 MCG TABS tablet Take 50 mcg by mouth daily.  . timolol (TIMOPTIC) 0.5 % ophthalmic solution Place 1 drop into both eyes daily.   . vitamin E 200 UNIT capsule Take 200 Units by mouth daily.   Facility-Administered Encounter Medications as of 11/19/2020  Medication  . sodium chloride flush (NS) 0.9 % injection 3 mL   Star Rating Drugs: Lisinopril 20 mg last filled on 08/30/2020 [facility]. Metformin 500 mg last filled on 08/30/2020 for [facility] day supply [facility] Rosuvastatin 40 mg  last filled on 08/30/2020 for [facility]  Reviewed chart for medication changes ahead of medication coordination call.   BP Readings  from Last 3 Encounters:  04/06/20 (!) 149/81  03/25/20 (!) 142/84  03/17/20 (!) 152/60    Lab Results  Component Value Date   HGBA1C 6.3 01/14/2020     Patient obtains medications through Vials  90 Days   Last adherence delivery included: None ID  Patient declined the following medications patient given 90 day supply on 08/30/2020   Rosuvastatin 40 mgone tabletdaily   Clopidogrel 75 mgone tabletdaily   Metformin ER 500 mgone tabletdaily   Lisinopril20 mgone tabletdaily  Aspirin 81 mgone tabletdaily  Hydrochlorothiazide 12.5 MGone CapsuleDaily  Ezetimibe 10 mg one tablet daily   Patient is due for next adherence delivery on: 11/29/2020. Called patient and reviewed medications and coordinated delivery.  I have attempted without success to contact this patient by phone three times to do his Medication Coordination call.   Line Busy 06/03,06/07,06/08  Anderson Malta Clinical Pharmacist Assistant [phone]   Line Busy 06/03,06/07,06/08

## 2020-11-24 ENCOUNTER — Other Ambulatory Visit: Payer: Self-pay | Admitting: Family Medicine

## 2020-11-24 DIAGNOSIS — E119 Type 2 diabetes mellitus without complications: Secondary | ICD-10-CM

## 2020-11-24 DIAGNOSIS — I10 Essential (primary) hypertension: Secondary | ICD-10-CM

## 2020-11-24 NOTE — Telephone Encounter (Signed)
Refill request for:  Metformin  LR 09/16/19, #90, 3 rfs  Lisinopril LR  01/14/20, #90, 3rfs LOV 03/25/20 FOV  None scheduled.    Please review and advise.  Thanks. Dm/cma

## 2021-02-01 ENCOUNTER — Telehealth: Payer: Self-pay

## 2021-02-01 NOTE — Progress Notes (Signed)
    Chronic Care Management Pharmacy Assistant   Name: Adam Perkins  MRN: [id number] DOB: [birth year]   Reason for Encounter:Diabetes Disease State Call.   Recent office visits:  No Recent Office Visit  Recent consult visits:  No recent [facility] visits:  None in previous 6 months  Medications: Outpatient Encounter Medications as of 02/01/2021  Medication Sig   Ascorbic Acid (VITAMIN C) 1000 MG tablet Take 100 mg by mouth daily.   aspirin EC 81 MG tablet Take 81 mg by mouth daily.   clopidogrel (PLAVIX) 75 MG tablet TAKE ONE TABLET BY MOUTH EVERY MORNING WITH BREAKFAST   doxycycline (VIBRA-TABS) 100 MG tablet Take 100 mg by mouth 2 (two) times daily.    doxycycline (VIBRA-TABS) 100 MG tablet TAKE 1 TABLET BY MOUTH TWICE A DAY   ezetimibe (ZETIA) 10 MG tablet Take 1 tablet (10 mg total) by mouth daily.   hydrochlorothiazide (MICROZIDE) 12.5 MG capsule Take 1 capsule (12.5 mg total) by mouth daily.   lisinopril (ZESTRIL) 20 MG tablet TAKE ONE TABLET BY MOUTH ONCE DAILY   metFORMIN (GLUCOPHAGE-XR) 500 MG 24 hr tablet TAKE ONE TABLET BY MOUTH ONCE DAILY WITH BREAKFAST   rosuvastatin (CRESTOR) 40 MG tablet TAKE ONE TABLET BY MOUTH ONCE DAILY   selenium 50 MCG TABS tablet Take 50 mcg by mouth daily.   timolol (TIMOPTIC) 0.5 % ophthalmic solution Place 1 drop into both eyes daily.    vitamin E 200 UNIT capsule Take 200 Units by mouth daily.   Facility-Administered Encounter Medications as of 02/01/2021  Medication   sodium chloride flush (NS) 0.9 % injection 3 mL    Care Gaps: Ophthalmology Exam Colonoscopy Shingrix COVID-19 Vaccine PNA Vaccine Hemoglobin A1C Foot Exam Influenza Vaccine Star Rating Drugs: Lisinopril 20 mg last filled on 11/29/2020 [facility]. Metformin 500 mg last filled on 11/29/2020 for [facility] Rosuvastatin 40 mg  last filled on 11/24/2020 for [facility] day [facility] Medication Fill Gaps: Ezetimibe 10 MG last filled 06/01/2020 90 day supply.  Recent Relevant Labs: Lab Results  Component Value Date/Time   HGBA1C 6.3 01/14/2020 09:54 AM   HGBA1C 7.0 (H) 06/17/2019 11:14 AM   MICROALBUR 1.5 11/30/2015 12:05 PM    Kidney Function Lab Results  Component Value Date/Time   CREATININE 1.17 03/29/2020 09:06 AM   CREATININE 0.78 08/12/2019 02:36 AM   CREATININE 0.77 11/30/2015 12:05 PM   GFR 105.07 06/17/2019 11:14 AM   GFRNONAA 63 03/29/2020 09:06 AM   GFRNONAA >89 11/30/2015 12:05 PM   GFRAA 73 03/29/2020 09:06 AM   GFRAA >89 11/30/2015 12:05 PM    Current antihyperglycemic regimen:  Metformin 500 mg 1 tablet daily What recent interventions/DTPs have been made to improve glycemic control:  None ID Have there been any recent hospitalizations or ED visits since last visit with CPP? No I have attempted without success to contact this patient by phone three times to do his Diabetes Disease State call. 08/16,08/17,08/18 Line Busy   Adherence Review: Is the patient currently on a STATIN medication? Yes Is the patient currently on ACE/ARB medication? Yes Does the patient have >5 day gap between last estimated fill dates? No    Anderson Malta Clinical Production designer, theatre/television/film [phone]

## 2021-02-09 ENCOUNTER — Other Ambulatory Visit: Payer: Self-pay | Admitting: Cardiovascular Disease

## 2021-02-09 DIAGNOSIS — I739 Peripheral vascular disease, unspecified: Secondary | ICD-10-CM

## 2021-02-12 ENCOUNTER — Other Ambulatory Visit: Payer: Self-pay | Admitting: Cardiovascular Disease

## 2021-02-17 ENCOUNTER — Telehealth: Payer: Self-pay

## 2021-02-17 NOTE — Progress Notes (Signed)
    Chronic Care Management Pharmacy Assistant   Name: Adam Perkins  MRN: [id number] DOB: 09-17-1950  Reason for Encounter: Medication Review/Medication Coordination Call.   Recent office visits:  No Recent Office Visit  Recent consult visits:  No recent [facility] visits:  None in previous 6 months  Medications: Outpatient Encounter Medications as of 02/17/2021  Medication Sig   Ascorbic Acid (VITAMIN C) 1000 MG tablet Take 100 mg by mouth daily.   aspirin EC 81 MG tablet Take 81 mg by mouth daily.   clopidogrel (PLAVIX) 75 MG tablet TAKE ONE TABLET BY MOUTH EVERY MORNING WITH BREAKFAST   doxycycline (VIBRA-TABS) 100 MG tablet Take 100 mg by mouth 2 (two) times daily.    doxycycline (VIBRA-TABS) 100 MG tablet TAKE 1 TABLET BY MOUTH TWICE A DAY   ezetimibe (ZETIA) 10 MG tablet Take 1 tablet (10 mg total) by mouth daily.   hydrochlorothiazide (MICROZIDE) 12.5 MG capsule Take 1 capsule (12.5 mg total) by mouth daily.   lisinopril (ZESTRIL) 20 MG tablet TAKE ONE TABLET BY MOUTH ONCE DAILY   metFORMIN (GLUCOPHAGE-XR) 500 MG 24 hr tablet TAKE ONE TABLET BY MOUTH ONCE DAILY WITH BREAKFAST   rosuvastatin (CRESTOR) 40 MG tablet TAKE ONE TABLET BY MOUTH ONCE DAILY   selenium 50 MCG TABS tablet Take 50 mcg by mouth daily.   timolol (TIMOPTIC) 0.5 % ophthalmic solution Place 1 drop into both eyes daily.    vitamin E 200 UNIT capsule Take 200 Units by mouth daily.   Facility-Administered Encounter Medications as of 02/17/2021  Medication   sodium chloride flush (NS) 0.9 % injection 3 mL    Care Gaps: Ophthalmology Exam Colonoscopy Shingrix Vaccine COVID-19 Vaccine PNA Vaccine Hemoglobin A1C Foot Exam Influenza Vaccine Star Rating Drugs: Lisinopril 20 mg last filled on 11/29/2020 for [facility]. Metformin 500 mg last filled on 11/29/2020 [facility] Rosuvastatin 40 mg  last filled on 11/24/2020 [facility] Medication Fill Gaps: Ezetimibe 10 MG last filled 06/01/2020 90 day supply.   Reviewed chart for medication changes ahead of medication coordination call.  BP Readings from Last 3 Encounters:  04/06/20 (!) 149/81  03/25/20 (!) 142/84  03/17/20 (!) 152/60    Lab Results  Component Value Date   HGBA1C 6.3 01/14/2020     Patient obtains medications through Vials  90 Days   Last adherence delivery included:  None ID  Patient declined Medications last month-  patient given 90 day supply on 11/29/2020  Rosuvastatin 40 mg one tablet daily  Clopidogrel 75 mg one tablet daily  Metformin ER 500 mg one tablet  daily  Lisinopril 20 mg one tablet   daily  Aspirin 81 mg one tablet  daily  Hydrochlorothiazide 12.5 MG one Capsule Daily Ezetimibe 10 mg one tablet daily (no fill since 06/01/2020)  Patient is due for next adherence delivery on: 02/25/2021. Called patient and reviewed medications and coordinated delivery.  I have attempted without success to contact this patient by phone three times to do his medication coordination call.[facility] to setup patient delivery for this month, and I will attempt to contact the patient in October.   [place] Pharmacist Assistant [phone]

## 2021-02-22 ENCOUNTER — Ambulatory Visit (HOSPITAL_COMMUNITY): Admission: RE | Admit: 2021-02-22 | Payer: Medicare Other | Source: Ambulatory Visit

## 2021-02-22 ENCOUNTER — Ambulatory Visit (HOSPITAL_COMMUNITY)
Admission: RE | Admit: 2021-02-22 | Payer: Medicare Other | Source: Ambulatory Visit | Attending: Cardiovascular Disease | Admitting: Cardiovascular Disease

## 2021-02-28 ENCOUNTER — Other Ambulatory Visit: Payer: Self-pay | Admitting: Cardiovascular Disease

## 2021-03-04 ENCOUNTER — Encounter (HOSPITAL_COMMUNITY): Payer: Medicare Other

## 2021-03-12 ENCOUNTER — Telehealth: Payer: Self-pay

## 2021-03-12 NOTE — Telephone Encounter (Signed)
Lft VM to rtn call to schedule an AWV. Dm/cma

## 2021-05-12 ENCOUNTER — Other Ambulatory Visit: Payer: Self-pay | Admitting: Cardiovascular Disease

## 2021-05-17 ENCOUNTER — Telehealth: Payer: Self-pay

## 2021-05-17 NOTE — Progress Notes (Signed)
    Chronic Care Management Pharmacy Assistant   Name: TIMBER LUCARELLI  MRN: [id number] DOB: [birth year]  Reason for Encounter: Medication Review/Medication Coordination Call.   Recent office visits:  No recent office visit  Recent consult visits:  No recent consult visit   Hospital visits:  None in previous 6 months  Medications: Outpatient Encounter Medications as of 05/17/2021  Medication Sig   Ascorbic Acid (VITAMIN C) 1000 MG tablet Take 100 mg by mouth daily.   aspirin EC 81 MG tablet Take 81 mg by mouth daily.   clopidogrel (PLAVIX) 75 MG tablet TAKE ONE TABLET BY MOUTH EVERY MORNING with breakfast   doxycycline (VIBRA-TABS) 100 MG tablet Take 100 mg by mouth 2 (two) times daily.    ezetimibe (ZETIA) 10 MG tablet Take 1 tablet (10 mg total) by mouth daily.   hydrochlorothiazide (MICROZIDE) 12.5 MG capsule Take 1 capsule (12.5 mg total) by mouth daily.   lisinopril (ZESTRIL) 20 MG tablet TAKE ONE TABLET BY MOUTH ONCE DAILY   metFORMIN (GLUCOPHAGE-XR) 500 MG 24 hr tablet TAKE ONE TABLET BY MOUTH ONCE DAILY WITH BREAKFAST   rosuvastatin (CRESTOR) 40 MG tablet TAKE ONE TABLET BY MOUTH ONCE DAILY   selenium 50 MCG TABS tablet Take 50 mcg by mouth daily.   timolol (TIMOPTIC) 0.5 % ophthalmic solution Place 1 drop into both eyes daily.    vitamin E 200 UNIT capsule Take 200 Units by mouth daily.   Facility-Administered Encounter Medications as of 05/17/2021  Medication   sodium chloride flush (NS) 0.9 % injection 3 mL   Care Gaps: Ophthalmology Exam Colonoscopy Shingrix Vaccine COVID-19 Vaccine PNA Vaccine Hemoglobin A1C Foot Exam Influenza Vaccine Star Rating Drugs: Lisinopril 20 mg last filled on 02/21/2021 for [facility] day supply [facility]. Metformin 500 mg last filled on 09/052022 for [facility] day supply [facility] Rosuvastatin 40 mg  last filled on 02/21/2021 for [facility] day supply [facility] Medication Fill Gaps: Ezetimibe 10 MG last filled  06/01/2020 90 day supply.   Reviewed chart for medication changes ahead of medication coordination call.  BP Readings from Last 3 Encounters:  04/06/20 (!) 149/81  03/25/20 (!) 142/84  03/17/20 (!) 152/60    Lab Results  Component Value Date   HGBA1C 6.3 01/14/2020     Patient obtains medications through Vials  90 Days   [facility] data:  Last adherence delivery included:   None ID  Patient declined medications last month - patient given 90 day supply on 02/21/2021 Rosuvastatin 40 mg one tablet daily  Clopidogrel 75 mg one tablet daily  Metformin ER 500 mg one tablet  daily  Lisinopril 20 mg one tablet   daily  Aspirin 81 mg one tablet  daily  Hydrochlorothiazide 12.5 MG one Capsule Daily Ezetimibe 10 mg one tablet daily (no fill since 06/01/2020)  Patient is due for next adherence delivery on: 05/26/2021. Called patient and reviewed medications and coordinated delivery.  Unable to reach patient to completed Medication Coordination form.  [facility] will contact [facility] to confirm delivery.Junius Argyle, CPP was notified I was unable to reach patient  [place] Pharmacist Assistant [phone]

## 2021-05-18 ENCOUNTER — Other Ambulatory Visit (HOSPITAL_COMMUNITY): Payer: Self-pay

## 2021-05-18 MED ORDER — TIMOLOL MALEATE 0.5 % OP SOLN
1.0000 [drp] | Freq: Every morning | OPHTHALMIC | 3 refills | Status: AC
  Filled 2021-05-18: qty 5, 50d supply, fill #0
  Filled 2021-10-04: qty 5, 50d supply, fill #1
  Filled 2022-03-21: qty 5, 50d supply, fill #2

## 2021-08-12 ENCOUNTER — Other Ambulatory Visit: Payer: Self-pay | Admitting: Cardiovascular Disease

## 2021-08-17 ENCOUNTER — Encounter (HOSPITAL_COMMUNITY): Payer: Self-pay

## 2021-09-02 ENCOUNTER — Ambulatory Visit (INDEPENDENT_AMBULATORY_CARE_PROVIDER_SITE_OTHER): Payer: Medicare Other | Admitting: Cardiovascular Disease

## 2021-09-02 ENCOUNTER — Encounter: Payer: Self-pay | Admitting: Cardiovascular Disease

## 2021-09-02 ENCOUNTER — Other Ambulatory Visit: Payer: Self-pay

## 2021-09-02 DIAGNOSIS — I739 Peripheral vascular disease, unspecified: Secondary | ICD-10-CM | POA: Diagnosis not present

## 2021-09-02 NOTE — Patient Instructions (Signed)
Medication Instructions:  ?No changes ?*If you need a refill on your cardiac medications before your next appointment, please call your pharmacy* ? ? ?Lab Work: ?Your provider would like for you to have the following labs: Liver and Lipid ? ?If you have labs (blood work) drawn today and your tests are completely normal, you will receive your results only by: ?MyChart Message (if you have MyChart) OR ?A paper copy in the mail ?If you have any lab test that is abnormal or we need to change your treatment, we will call you to review the results. ? ? ?Testing/Procedures: ?Your physician has requested that you have an Aorta/Iliac Duplex. This will be take place at [place], [address]. ? ? ?No food after 11PM the night before.  Water is OK. (Don't drink liquids if you have been instructed not to for ANOTHER test) ?Avoid foods that produce bowel gas, for 24 hours prior to exam (see below). ?No breakfast, no chewing gum, no smoking or carbonated beverages. ?Patient may take morning medications with water. ?Come in for test at least 15 minutes early to register. ? ?Your physician has requested that you have an ankle brachial index (ABI). During this test an ultrasound and blood pressure cuff are used to evaluate the arteries that supply the arms and legs with blood. Allow thirty minutes for this exam. There are no restrictions or special instructions. This will take place at [place], [address].  ? ?Your physician has requested that you have a lower extremity arterial duplex. During this test, ultrasound is used to evaluate arterial blood flow in the legs. Allow one hour for this exam. There are no restrictions or special instructions. This will take place at [place], [address]. ? ? ? ?Follow-Up: ?At [facility], you and your health needs are our priority.  As part of our continuing mission to provide you with exceptional heart care, we have created designated Provider Care Teams.  These  Care Teams include your primary Cardiologist (physician) and Advanced Practice Providers (APPs -  Physician Assistants and Nurse Practitioners) who all work together to provide you with the care you need, when you need it. ? ?We recommend signing up for the patient portal called "MyChart".  Sign up information is provided on this After Visit Summary.  MyChart is used to connect with patients for Virtual Visits (Telemedicine).  Patients are able to view lab/test results, encounter notes, upcoming appointments, etc.  Non-urgent messages can be sent to your provider as well.   ?To learn more about what you can do with MyChart, go to [email or web address].   ? ?Your next appointment:   ?Follow up with Dr. Audie Box first available ?Follow up with Dr. Gwenlyn Found in 12 months ? ?

## 2021-09-02 NOTE — Assessment & Plan Note (Signed)
History of peripheral arterial disease status post right common iliac artery CTO PTA and stenting using a VBX covered stent 08/11/2019.  His follow-up Doppler studies normalized and his claudication resolved.  He did have a 40% left common iliac artery stenosis which I thought was nonobstructive.  He currently denies claudication.  His most recent Doppler studies performed 03/01/2020 revealed normal ABIs bilaterally with a patent iliac stent and moderate disease on the left.  We will repeat lower extremity arterial Doppler studies. ?

## 2021-09-02 NOTE — Progress Notes (Signed)
? ? ? ?09/02/2021 ?Adam Perkins   ?1951/02/28  ?[id number] ? ?Primary Physician Pcp, No ?Primary Cardiologist: Lorretta Harp MD Lupe Carney, [place] ? ?HPI:  Adam Perkins is a 71 y.o.  mild to moderately overweight widowed Caucasian male father of 2 children, grandfather 2 grandchildren who is retired from [employer].  Unfortunately, his wife Edwena Felty passed away 02/20/21.  He is referred by Dr. Audie Box  for evaluation of symptomatic PAD.  I last saw him in the office 03/17/2020. He has a 30+ pack year history tobacco abuse smoking 1/2 pack/day now since he was in his mid 76s.  He unfortunately is back smoking 1 pack/day.  He drinks 1 to 2 glasses of wine a night.  He does have treated hypertension, diabetes and hyperlipidemia.  Is never had a heart attack or stroke.  There is no family history of heart disease.  Denies chest pain or shortness of breath.  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 Ninfa Linden 12/15.  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 ?  ?I performed peripheral angiography, PTA and covered stenting of an occluded right common iliac artery on 08/11/2019.  His follow-up Doppler studies performed 08/25/2019 revealed normal ABIs and velocities.  His claudication has resolved.  He is on aspirin Plavix. ?  ?Since I saw him in the office a year and a half ago he continues to do well.  He is still smoking.  He denies claudication chest pain or shortness of breath.  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 ? ? ?Current Meds  ?Medication Sig  ? Ascorbic Acid (VITAMIN C) 1000 MG tablet Take 100 mg by mouth daily.  ? aspirin EC 81 MG tablet Take 81 mg by mouth daily.  ? ezetimibe (ZETIA) 10 MG tablet Take 1 tablet (10  mg total) by mouth daily.  ? hydrochlorothiazide (MICROZIDE) 12.5 MG capsule Take 1 capsule (12.5 mg total) by mouth daily.  ? lisinopril (ZESTRIL) 20 MG tablet TAKE ONE TABLET BY MOUTH ONCE DAILY  ? metFORMIN (GLUCOPHAGE-XR) 500 MG 24 hr tablet TAKE ONE TABLET BY MOUTH ONCE DAILY WITH BREAKFAST  ? rosuvastatin (CRESTOR) 40 MG tablet TAKE ONE TABLET BY MOUTH ONCE DAILY  ? selenium 50 MCG TABS tablet Take 50 mcg by mouth daily.  ? timolol (TIMOPTIC) 0.5 % ophthalmic solution Place 1 drop into both eyes daily.   ? timolol (TIMOPTIC) 0.5 % ophthalmic solution Place 1 drop into both eyes in the morning.  ? vitamin E 200 UNIT capsule Take 200 Units by mouth daily.  ? ?Current Facility-Administered Medications for the 09/02/21 encounter (Office Visit) with Lorretta Harp, MD  ?Medication  ? sodium chloride flush (NS) 0.9 % injection 3 mL  ?  ? ?No Known Allergies ? ?Social History  ? ?Socioeconomic History  ? Marital status: Married  ?  Spouse name: Not on file  ? Number of children: 2  ? Years of education: Not on file  ? Highest education level: Not on file  ?Occupational History  ? Not on file  ?Tobacco Use  ? Smoking status: Every Day  ?  Packs/day: 1.00  ?  Years: 25.00  ?  Pack years: 25.00  ?  Types: Cigarettes  ? Smokeless tobacco: Never  ? Tobacco comments:  ?  quit 2012 after smoking on and off a little for year. Now current smoker  ?Substance and Sexual Activity  ? Alcohol use: Yes  ?  Alcohol/week: 0.0 standard drinks  ?  Comment: 2 glasses of wine daily   ? Drug use: No  ? Sexual activity: Not Currently  ?Other Topics Concern  ? Not on file  ?Social History Narrative  ? Not on file  ? ?Social Determinants of Health  ? ?Financial Resource Strain: Not on file  ?Food Insecurity: Not on file  ?Transportation Needs: Not on file  ?Physical Activity: Not on file  ?Stress: Not on file  ?Social Connections: Not on file  ?Intimate Partner Violence: Not on file  ?  ? ?Review of Systems: ?General: negative for chills,  fever, night sweats or weight changes.  ?Cardiovascular: negative for chest pain, dyspnea on exertion, edema, orthopnea, palpitations, paroxysmal nocturnal dyspnea or shortness of breath ?Dermatological: negative for rash ?Respiratory: negative for cough or wheezing ?Urologic: negative for hematuria ?Abdominal: negative for nausea, vomiting, diarrhea, bright red blood per rectum, melena, or hematemesis ?Neurologic: negative for visual changes, syncope, or dizziness ?All other systems reviewed and are otherwise negative except as noted above. ? ? ? ?Blood pressure (!) 118/52, pulse 77, height '5\' 9"'$  (1.753 m), weight 187 lb 3.2 oz (84.9 kg), SpO2 96 %.  ?General appearance: alert and no distress ?Neck: no adenopathy, no carotid bruit, no JVD, supple, symmetrical, trachea midline, and thyroid not enlarged, symmetric, no tenderness/mass/nodules ?Lungs: clear to auscultation bilaterally ?Heart: regular rate and rhythm, S1, S2 normal, no murmur, click, rub or gallop ?Extremities: extremities normal, atraumatic, no cyanosis or edema ?Pulses: 2+ and symmetric ?Skin: Skin color, texture, turgor normal. No rashes or lesions ?Neurologic: Grossly normal ? ?EKG sinus rhythm at 77 with nonspecific ST and T wave changes.  Personally reviewed this EKG. ? ?ASSESSMENT AND PLAN:  ? ?Peripheral arterial disease ([place]) ?History of peripheral arterial disease status post right common iliac artery CTO PTA and stenting using a VBX covered stent 08/11/2019.  His follow-up Doppler studies normalized and his claudication resolved.  He did have a 40% left common iliac artery stenosis which I thought was nonobstructive.  He currently denies claudication.  His most recent Doppler studies performed 03/01/2020 revealed normal ABIs bilaterally with a patent iliac stent and moderate disease on the left.  We will repeat lower extremity arterial Doppler studies. ? ? ? ? ?Lorretta Harp MD FACP,FACC,FAHA, FSCAI ?09/02/2021 ?3:26 PM ?

## 2021-09-12 ENCOUNTER — Other Ambulatory Visit (HOSPITAL_COMMUNITY): Payer: Self-pay

## 2021-09-12 MED ORDER — GUAIFENESIN-CODEINE 100-10 MG/5ML PO SYRP
5.0000 mL | ORAL_SOLUTION | Freq: Four times a day (QID) | ORAL | 0 refills | Status: DC | PRN
  Filled 2021-09-12: qty 100, 5d supply, fill #0

## 2021-09-12 MED ORDER — AZITHROMYCIN 250 MG PO TABS
ORAL_TABLET | ORAL | 0 refills | Status: DC
  Filled 2021-09-12: qty 6, 5d supply, fill #0

## 2021-09-12 MED ORDER — ALBUTEROL SULFATE HFA 108 (90 BASE) MCG/ACT IN AERS
2.0000 | INHALATION_SPRAY | Freq: Four times a day (QID) | RESPIRATORY_TRACT | 0 refills | Status: DC | PRN
  Filled 2021-09-12: qty 8.5, 25d supply, fill #0

## 2021-10-04 ENCOUNTER — Other Ambulatory Visit (HOSPITAL_COMMUNITY): Payer: Self-pay

## 2021-10-10 ENCOUNTER — Ambulatory Visit (HOSPITAL_BASED_OUTPATIENT_CLINIC_OR_DEPARTMENT_OTHER)
Admission: RE | Admit: 2021-10-10 | Discharge: 2021-10-10 | Disposition: A | Discharge status: Home / Self Care | Payer: Medicare Other | Source: Ambulatory Visit | Attending: Cardiovascular Disease | Admitting: Cardiovascular Disease

## 2021-10-10 ENCOUNTER — Ambulatory Visit (HOSPITAL_COMMUNITY)
Admission: RE | Admit: 2021-10-10 | Discharge: 2021-10-10 | Disposition: A | Discharge status: Home / Self Care | Payer: Medicare Other | Source: Ambulatory Visit | Attending: Cardiology | Admitting: Cardiology

## 2021-10-10 DIAGNOSIS — I739 Peripheral vascular disease, unspecified: Secondary | ICD-10-CM | POA: Insufficient documentation

## 2021-10-12 ENCOUNTER — Other Ambulatory Visit: Payer: Self-pay

## 2021-10-12 DIAGNOSIS — I739 Peripheral vascular disease, unspecified: Secondary | ICD-10-CM

## 2021-10-12 DIAGNOSIS — Z95828 Presence of other vascular implants and grafts: Secondary | ICD-10-CM

## 2021-10-18 ENCOUNTER — Other Ambulatory Visit: Payer: Self-pay | Admitting: Cardiovascular Disease

## 2022-02-06 ENCOUNTER — Other Ambulatory Visit: Payer: Self-pay | Admitting: Cardiovascular Disease

## 2022-02-14 ENCOUNTER — Other Ambulatory Visit: Payer: Self-pay | Admitting: Cardiovascular Disease

## 2022-03-21 ENCOUNTER — Other Ambulatory Visit (HOSPITAL_COMMUNITY): Payer: Self-pay

## 2022-03-24 ENCOUNTER — Other Ambulatory Visit: Payer: Self-pay

## 2022-03-24 DIAGNOSIS — I10 Essential (primary) hypertension: Secondary | ICD-10-CM

## 2022-03-24 MED ORDER — LISINOPRIL 20 MG PO TABS: 20.0000 mg | ORAL_TABLET | Freq: Every day | ORAL | 3 refills | Status: DC

## 2022-05-16 ENCOUNTER — Other Ambulatory Visit: Payer: Self-pay | Admitting: Cardiovascular Disease

## 2022-05-16 DIAGNOSIS — E119 Type 2 diabetes mellitus without complications: Secondary | ICD-10-CM

## 2022-07-28 ENCOUNTER — Other Ambulatory Visit (HOSPITAL_COMMUNITY): Payer: Self-pay

## 2022-07-28 MED ORDER — TIMOLOL MALEATE 0.5 % OP SOLN
1.0000 [drp] | Freq: Every morning | OPHTHALMIC | 0 refills | Status: DC
  Filled 2022-07-28: qty 10, 100d supply, fill #0

## 2022-08-05 ENCOUNTER — Other Ambulatory Visit: Payer: Self-pay | Admitting: Cardiovascular Disease

## 2022-10-11 ENCOUNTER — Encounter: Payer: Self-pay | Admitting: Cardiovascular Disease

## 2022-10-11 ENCOUNTER — Ambulatory Visit: Payer: Medicare Other | Attending: Cardiovascular Disease | Admitting: Cardiovascular Disease

## 2022-10-11 VITALS — BP 142/84 | HR 54 | Ht 69.0 in | Wt 187.0 lb

## 2022-10-11 DIAGNOSIS — I739 Peripheral vascular disease, unspecified: Secondary | ICD-10-CM | POA: Diagnosis not present

## 2022-10-11 DIAGNOSIS — Z95828 Presence of other vascular implants and grafts: Secondary | ICD-10-CM

## 2022-10-11 DIAGNOSIS — E1169 Type 2 diabetes mellitus with other specified complication: Secondary | ICD-10-CM | POA: Diagnosis not present

## 2022-10-11 DIAGNOSIS — Z720 Tobacco use: Secondary | ICD-10-CM | POA: Diagnosis not present

## 2022-10-11 DIAGNOSIS — E785 Hyperlipidemia, unspecified: Secondary | ICD-10-CM

## 2022-10-11 NOTE — Assessment & Plan Note (Signed)
Hyperlipidemia not at goal for secondary prevention given his vascular disease on high-dose rosuvastatin.  We will check a fasting lipid profile today.

## 2022-10-11 NOTE — Patient Instructions (Addendum)
Medication Instructions:  Your physician recommends that you continue on your current medications as directed. Please refer to the Current Medication list given to you today.  *If you need a refill on your cardiac medications before your next appointment, please call your pharmacy*   Lab Work: Your physician recommends that you have labs drawn today: Lipid/liver panel  If you have labs (blood work) drawn today and your tests are completely normal, you will receive your results only by: MyChart Message (if you have MyChart) OR A paper copy in the mail If you have any lab test that is abnormal or we need to change your treatment, we will call you to review the results.   Testing/Procedures: Your physician has requested that you have an Aorta/Iliac Duplex. This will be take place at [address] [facility], [address].  No food after 11PM the night before.  Water is OK. (Don't drink liquids if you have been instructed not to for ANOTHER test) Avoid foods that produce bowel gas, for 24 hours prior to exam (see below). No breakfast, no chewing gum, no smoking or carbonated beverages. Patient may take morning medications with water. Come in for test at least 15 minutes early to register.  Your physician has requested that you have an ankle brachial index (ABI). During this test an ultrasound and blood pressure cuff are used to evaluate the arteries that supply the arms and legs with blood. Allow thirty minutes for this exam. There are no restrictions or special instructions. This will take place at [address] [facility], [address].     Follow-Up: At [facility], you and your health needs are our priority.  As part of our continuing mission to provide you with exceptional heart care, we have created designated Provider Care Teams.  These Care Teams include your primary Cardiologist (physician) and Advanced Practice Providers (APPs -  Physician Assistants and Nurse Practitioners) who all work  together to provide you with the care you need, when you need it.  We recommend signing up for the patient portal called "MyChart".  Sign up information is provided on this After Visit Summary.  MyChart is used to connect with patients for Virtual Visits (Telemedicine).  Patients are able to view lab/test results, encounter notes, upcoming appointments, etc.  Non-urgent messages can be sent to your provider as well.   To learn more about what you can do with MyChart, go to [email or web address].    Your next appointment:   12 month(s)  Provider:   Nanetta Batty, MD

## 2022-10-11 NOTE — Assessment & Plan Note (Signed)
Ongoing tobacco use of 1/2 pack/day.  He has desire to quit.  We had a long discussion about vascular risk factor modification.  He is using Nicorette gum.

## 2022-10-11 NOTE — Progress Notes (Signed)
10/11/2022 Adam Perkins   06-14-1951  [id number]  Primary Physician Pcp, No Primary Cardiologist: Runell Gess MD Milagros Loll, [place], [place]  HPI:  Adam Perkins is a 72 y.o.   mild to moderately overweight widowed Caucasian male father of 2 children, grandfather 2 grandchildren who is retired from [employer].  Unfortunately, his wife Karin Golden passed away 03-01-2021.  He is referred by Dr. Flora Lipps  for evaluation of symptomatic PAD.  I last saw him in the office 09/02/2021. He has a 30+ pack year history tobacco abuse smoking 1/2 pack/day now since he was in his mid 11s.  He unfortunately is back smoking 1 pack/day.  He drinks 1 to 2 glasses of wine a night.  He does have treated hypertension, diabetes and hyperlipidemia.  Is never had a heart attack or stroke.  There is no family history of heart disease.  Denies chest pain or shortness of breath.  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 Magnus Ivan 12/15.  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   I performed peripheral angiography, PTA and covered stenting of an occluded right common iliac artery on 08/11/2019.  His follow-up Doppler studies performed 08/25/2019 revealed normal ABIs and velocities.  His claudication has resolved.  He is on aspirin Plavix.   Since I saw him in the office a year ago he continues to do well.  He is still smoking.  He denies claudication chest pain or shortness of breath.  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   Current Meds  Medication Sig   Ascorbic Acid (VITAMIN C) 1000 MG tablet Take 100 mg by mouth daily.   aspirin EC 81 MG tablet Take 81 mg by mouth daily.   lisinopril (ZESTRIL) 20 MG tablet Take 1 tablet (20 mg  total) by mouth daily.   rosuvastatin (CRESTOR) 40 MG tablet TAKE ONE TABLET BY MOUTH ONCE DAILY   selenium 50 MCG TABS tablet Take 50 mcg by mouth daily.   timolol (TIMOPTIC) 0.5 % ophthalmic solution Place 1 drop into both eyes daily.    timolol (TIMOPTIC) 0.5 % ophthalmic solution Place 1 drop into both eyes in the morning.   vitamin E 200 UNIT capsule Take 200 Units by mouth daily.     No Known Allergies  Social History   Socioeconomic History   Marital status: Married    Spouse name: Not on file   Number of children: 2   Years of education: Not on file   Highest education level: Not on file  Occupational History   Not on file  Tobacco Use   Smoking status: Every Day    Packs/day: 1.00    Years: 25.00    Additional pack years: 0.00    Total pack years: 25.00    Types: Cigarettes   Smokeless tobacco: Never   Tobacco comments:    quit 2012 after smoking on and off a little for year. Now current smoker  Substance and Sexual Activity   Alcohol use: Yes    Alcohol/week: 0.0 standard drinks of alcohol    Comment: 2 glasses of wine daily    Drug use: No   Sexual  activity: Not Currently  Other Topics Concern   Not on file  Social History Narrative   Not on file   Social Determinants of Health   Financial Resource Strain: Low Risk  (02/27/2020)   Overall Financial Resource Strain (CARDIA)    Difficulty of Paying Living Expenses: Not hard at all  Food Insecurity: Not on file  Transportation Needs: No Transportation Needs (02/27/2020)   PRAPARE - Administrator, Civil Service (Medical): No    Lack of Transportation (Non-Medical): No  Physical Activity: Not on file  Stress: Not on file  Social Connections: Not on file  Intimate Partner Violence: Not on file     Review of Systems: General: negative for chills, fever, night sweats or weight changes.  Cardiovascular: negative for chest pain, dyspnea on exertion, edema, orthopnea, palpitations, paroxysmal  nocturnal dyspnea or shortness of breath Dermatological: negative for rash Respiratory: negative for cough or wheezing Urologic: negative for hematuria Abdominal: negative for nausea, vomiting, diarrhea, bright red blood per rectum, melena, or hematemesis Neurologic: negative for visual changes, syncope, or dizziness All other systems reviewed and are otherwise negative except as noted above.    Blood pressure (!) 142/84, pulse (!) 54, height  (1.753 m), weight 187 lb (84.8 kg), SpO2 97 %.  General appearance: alert and no distress Neck: no adenopathy, no carotid bruit, no JVD, supple, symmetrical, trachea midline, and thyroid not enlarged, symmetric, no tenderness/mass/nodules Lungs: clear to auscultation bilaterally Heart: regular rate and rhythm, S1, S2 normal, no murmur, click, rub or gallop Extremities: extremities normal, atraumatic, no cyanosis or edema Pulses: 2+ and symmetric Skin: Skin color, texture, turgor normal. No rashes or lesions Neurologic: Grossly normal  EKG sinus bradycardia 54 without ST or T wave changes.  Personally reviewed this EKG.  ASSESSMENT AND PLAN:   Hyperlipidemia associated with type 2 diabetes mellitus (HCC) Hyperlipidemia not at goal for secondary prevention given his vascular disease on high-dose rosuvastatin.  We will check a fasting lipid profile today.  Peripheral arterial disease (HCC) History of peripheral arterial disease status post right common iliac CTO intervention by myself 08/11/2019 for right hip and buttock claudication.  He may have had a right total hip replacement done 12/15 by Dr. Magnus Ivan for similar symptoms.  His symptoms resolved after revascularization.  He now exercises 4-5 times a week without limitation.  His most recent Doppler studies performed 10/06/2021 revealed widely patent stent with some progression of disease in his left common iliac artery although he is asymptomatic on that side.  Will continue to follow him  noninvasively.  Tobacco abuse Ongoing tobacco use of 1/2 pack/day.  He has desire to quit.  We had a long discussion about vascular risk factor modification.  He is using Nicorette gum.     Runell Gess MD FACP,FACC,FAHA, [facility] 10/11/2022 9:48 AM

## 2022-10-11 NOTE — Assessment & Plan Note (Signed)
History of peripheral arterial disease status post right common iliac CTO intervention by myself 08/11/2019 for right hip and buttock claudication.  He may have had a right total hip replacement done 12/15 by Dr. Magnus Ivan for similar symptoms.  His symptoms resolved after revascularization.  He now exercises 4-5 times a week without limitation.  His most recent Doppler studies performed 10/06/2021 revealed widely patent stent with some progression of disease in his left common iliac artery although he is asymptomatic on that side.  Will continue to follow him noninvasively.

## 2022-10-12 LAB — HEPATIC FUNCTION PANEL
ALT: 44 IU/L (ref 0–44)
AST: 29 IU/L (ref 0–40)
Albumin: 4.7 g/dL (ref 3.8–4.8)
Alkaline Phosphatase: 38 IU/L — ABNORMAL LOW (ref 44–121)
Bilirubin Total: 0.5 mg/dL (ref 0.0–1.2)
Bilirubin, Direct: 0.15 mg/dL (ref 0.00–0.40)
Total Protein: 7.3 g/dL (ref 6.0–8.5)

## 2022-10-12 LAB — LIPID PANEL
Chol/HDL Ratio: 3.3 ratio (ref 0.0–5.0)
Cholesterol, Total: 182 mg/dL (ref 100–199)
HDL: 56 mg/dL (ref >39)
LDL Chol Calc (NIH): 108 mg/dL — ABNORMAL HIGH (ref 0–99)
Triglycerides: 101 mg/dL (ref 0–149)
VLDL Cholesterol Cal: 18 mg/dL (ref 5–40)

## 2022-10-24 ENCOUNTER — Other Ambulatory Visit (HOSPITAL_COMMUNITY): Payer: Self-pay

## 2022-10-25 ENCOUNTER — Other Ambulatory Visit: Payer: Self-pay

## 2022-10-25 DIAGNOSIS — E1169 Type 2 diabetes mellitus with other specified complication: Secondary | ICD-10-CM

## 2022-10-25 MED ORDER — EZETIMIBE 10 MG PO TABS: 10.0000 mg | ORAL_TABLET | Freq: Every day | ORAL | 3 refills | Status: DC

## 2022-10-25 NOTE — Progress Notes (Signed)
Spoke with pt regarding his lab work and recommendations per Dr. Allyson Sabal. Pt is agreeable to the plan. Prescription sent to preferred pharmacy. Orders for labs placed and mailed to pt's home address. Pt verbalizes understanding.

## 2022-11-10 ENCOUNTER — Ambulatory Visit (HOSPITAL_BASED_OUTPATIENT_CLINIC_OR_DEPARTMENT_OTHER)
Admission: RE | Admit: 2022-11-10 | Discharge: 2022-11-10 | Disposition: A | Discharge status: Home / Self Care | Payer: Medicare Other | Source: Ambulatory Visit | Attending: Cardiovascular Disease | Admitting: Cardiovascular Disease

## 2022-11-10 ENCOUNTER — Ambulatory Visit (HOSPITAL_COMMUNITY)
Admission: RE | Admit: 2022-11-10 | Discharge: 2022-11-10 | Disposition: A | Discharge status: Home / Self Care | Payer: Medicare Other | Source: Ambulatory Visit | Attending: Cardiology | Admitting: Cardiology

## 2022-11-10 DIAGNOSIS — E1169 Type 2 diabetes mellitus with other specified complication: Secondary | ICD-10-CM | POA: Insufficient documentation

## 2022-11-10 DIAGNOSIS — I739 Peripheral vascular disease, unspecified: Secondary | ICD-10-CM | POA: Diagnosis not present

## 2022-11-10 DIAGNOSIS — Z720 Tobacco use: Secondary | ICD-10-CM | POA: Insufficient documentation

## 2022-11-10 DIAGNOSIS — Z95828 Presence of other vascular implants and grafts: Secondary | ICD-10-CM

## 2022-11-10 DIAGNOSIS — E785 Hyperlipidemia, unspecified: Secondary | ICD-10-CM | POA: Insufficient documentation

## 2022-11-10 LAB — VAS US ABI WITH/WO TBI
Left ABI: 0.97
Right ABI: 1.06

## 2022-11-13 NOTE — Progress Notes (Unsigned)
Cardiology Office Note:   Date:  11/15/2022  NAME:  Adam Perkins    MRN: [id number] DOB:  [birth year]   PCP:  Oneita Hurt, No  Cardiologist:  Reatha Harps, MD  Electrophysiologist:  None   Referring MD: No ref. provider found   Chief Complaint  Patient presents with   Follow-up        History of Present Illness:   Adam Perkins is a 72 y.o. male with a hx of PAD, HTN, HLD, DM, tobacco abuse who presents for follow-up. Needs an echo to follow-up  on aortic stenosis.  He reports he is doing well.  His wife passed away 2 years ago.  Seems to be doing better.  Reports no chest pains or trouble breathing.  Going to the gym several days per week without limitations.  No significant claudication symptoms.  His LDL cholesterol is not at goal.  Added Zetia by Dr. Gery Pray.  Plan to recheck this in the next few weeks.  BP 178/76.  Only on lisinopril.  We will increase the dose to 40 mg and have him check daily.  He is diabetic but A1c was checked 3 years ago.  Will recheck that today.  Describes no symptoms of hyperglycemia.  We also discussed his echocardiogram from 3 years ago.  Needs follow-up of aortic stenosis.  Suspect it is still mild based on examination today.  Still smoking several cigarettes per day.  We discussed the importance of smoking cessation.  Overall seems to be stable but has not seen [place] in several years.   Problem List 1. Diabetes  -A1c 6.3 2. Hypertension 3. Hyperlipidemia  -T chol 182, HDL 36, LDL 108, TG 101 4. PAD  -s/p stent to occluded R common iliac artery 08/11/2019 5. Tobacco abuse (25 pack-years) 6. 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    Past Surgical History: Past Surgical History:  Procedure Laterality Date   ABDOMINAL AORTOGRAM W/LOWER EXTREMITY N/A 08/11/2019   Procedure: ABDOMINAL AORTOGRAM W/LOWER EXTREMITY;  Surgeon: Runell Gess, MD;  Location: [facility];  Service: Cardiovascular;  Laterality: N/A;   CHEILECTOMY Right 04/03/2019   Procedure: RIGHT DORSAL CHEILECTOMY;  Surgeon: Terance Hart, MD;  Location: [facility];  Service: Orthopedics;  Laterality: Right;  SURGERY REQUEST TIME 90 MIN   PERIPHERAL VASCULAR INTERVENTION Right 08/11/2019   Procedure: PERIPHERAL VASCULAR INTERVENTION;  Surgeon: Runell Gess, MD;  Location: [facility];  Service: Cardiovascular;  Laterality: Right;  Iliac   TOTAL HIP ARTHROPLASTY Right 05/28/2014   Procedure: RIGHT TOTAL HIP ARTHROPLASTY ANTERIOR APPROACH;  Surgeon: Kathryne Hitch, MD;  Location: [facility];  Service: Orthopedics;  Laterality: Right;    Current Medications: Current Meds  Medication Sig   Ascorbic Acid (VITAMIN C) 1000 MG tablet Take 100 mg by mouth daily.   aspirin EC 81 MG tablet Take 81 mg by mouth daily.   ezetimibe (ZETIA) 10 MG tablet Take 1 tablet (10 mg total) by mouth daily.   rosuvastatin (CRESTOR) 40  MG tablet TAKE ONE TABLET BY MOUTH ONCE DAILY   selenium 50 MCG TABS tablet Take 50 mcg by mouth daily.   timolol (TIMOPTIC) 0.5 % ophthalmic solution Place 1 drop into both eyes daily.    timolol (TIMOPTIC) 0.5 % ophthalmic solution Place 1 drop into both eyes in the morning.   vitamin E 200 UNIT capsule Take 200 Units by mouth daily.   [DISCONTINUED] lisinopril (ZESTRIL) 20 MG tablet Take 1 tablet (20 mg total) by mouth daily.   [DISCONTINUED] metFORMIN (GLUCOPHAGE-XR) 500 MG 24 hr tablet TAKE ONE TABLET BY MOUTH EVERY MORNING   [DISCONTINUED] tadalafil (CIALIS) 20 MG tablet Take 20 mg by mouth daily as needed.     Allergies:     Patient has no known allergies.   Social History: Social History   Socioeconomic History   Marital status: Married    Spouse name: Not on file   Number of children: 2   Years of education: Not on file   Highest education level: Not on file  Occupational History   Not on file  Tobacco Use   Smoking status: Every Day    Packs/day: 1.00    Years: 25.00    Additional pack years: 0.00    Total pack years: 25.00    Types: Cigarettes   Smokeless tobacco: Never   Tobacco comments:    quit 2012 after smoking on and off a little for year. Now current smoker  Substance and Sexual Activity   Alcohol use: Yes    Alcohol/week: 0.0 standard drinks of alcohol    Comment: 2 glasses of wine daily    Drug use: No   Sexual activity: Not Currently  Other Topics Concern   Not on file  Social History Narrative   Not on file   Social Determinants of Health   Financial Resource Strain: Low Risk  (02/27/2020)   Overall Financial Resource Strain (CARDIA)    Difficulty of Paying Living Expenses: Not hard at all  Food Insecurity: Not on file  Transportation Needs: No Transportation Needs (02/27/2020)   PRAPARE - Administrator, Civil Service (Medical): No    Lack of Transportation (Non-Medical): No  Physical Activity: Not on file  Stress: Not on file  Social Connections: Not on file     Family History: The patient's family history includes Arthritis in his father; Cancer (age of onset: 21) in his father; Diabetes in his mother.  ROS:   All other ROS reviewed and negative. Pertinent positives noted in the HPI.     EKGs/Labs/Other Studies Reviewed:   The following studies were personally reviewed by me today:    Recent Labs: 10/11/2022: ALT 44   Recent Lipid Panel    Component Value Date/Time   CHOL 182 10/11/2022 0959   TRIG 101 10/11/2022 0959   HDL 56 10/11/2022 0959   CHOLHDL 3.3 10/11/2022 0959   CHOLHDL 7 06/17/2019 1114   VLDL 43.0 (H) 06/17/2019 1114    LDLCALC 108 (H) 10/11/2022 0959   LDLDIRECT 220.0 06/17/2019 1114    Physical Exam:   VS:  BP (!) 178/76   Pulse 72   Ht 5\' 9"  (1.753 m)   Wt 181 lb 12.8 oz (82.5 kg)   SpO2 95%   BMI 26.85 kg/m    Wt Readings from Last 3 Encounters:  11/15/22 181 lb 12.8 oz (82.5 kg)  10/11/22 187 lb (84.8 kg)  09/02/21 187 lb 3.2 oz (84.9 kg)    General: Well  nourished, well developed, in no acute distress Head: Atraumatic, normal size  Eyes: PEERLA, EOMI  Neck: Supple, no JVD Endocrine: No thryomegaly Cardiac: Normal S1, S2; RRR; 2 out of 6 systolic ejection murmur Lungs: Clear to auscultation bilaterally, no wheezing, rhonchi or rales  Abd: Soft, nontender, no hepatomegaly  Ext: No edema, pulses 2+ Musculoskeletal: No deformities, BUE and BLE strength normal and equal Skin: Warm and dry, no rashes   Neuro: Alert and oriented to person, place, time, and situation, CNII-XII grossly intact, no focal deficits  Psych: Normal mood and affect   ASSESSMENT:   Adam Perkins is a 72 y.o. male who presents for the following: 1. Peripheral arterial disease (HCC)   2. Tobacco abuse   3. Hyperlipidemia associated with type 2 diabetes mellitus (HCC)   4. Nonrheumatic aortic valve stenosis   5. Controlled type 2 diabetes mellitus without complication, without long-term current use of insulin (HCC)   6. Essential hypertension     PLAN:   1. Peripheral arterial disease (HCC) -Status post intervention to the right common iliac artery in 2021.  Followed by Dr. Gery Pray.  Recent ultrasound showed mild restenosis but no symptoms.  This will be followed.  Currently on aspirin.  Lipids not at goal.  See discussion below.  2. Tobacco abuse -3 minutes of smoking cessation counseling was provided in office today.  3. Hyperlipidemia associated with type 2 diabetes mellitus (HCC) -LDL not at goal.  On Crestor 40 mg daily.  Zetia was recently added.  Will have him come back in 1 month to reassess lipids.  Suspect  he will end up needing PCSK9 inhibitor therapy.  We will see what we can get with Crestor and Zetia.  4. Nonrheumatic aortic valve stenosis -Mild aortic stenosis in 2021.  Repeat echo.  Murmur still sounds mild.  5. Controlled type 2 diabetes mellitus without complication, without long-term current use of insulin (HCC) -Check A1c today.  Refill metformin.  We discussed the importance of getting a primary care physician.  We will see where his diabetes number is.  6. Essential hypertension -BP not at goal.  Increase lisinopril to 40 mg daily.  He needs to start checking his blood pressure daily.  He will see me back in 3 months to discuss further.  Disposition: Return in about 3 months (around 02/15/2023).  Medication Adjustments/Labs and Tests Ordered: Current medicines are reviewed at length with the patient today.  Concerns regarding medicines are outlined above.  Orders Placed This Encounter  Procedures   Basic metabolic panel   Hemoglobin A1c   Lipid panel   ECHOCARDIOGRAM COMPLETE   Meds ordered this encounter  Medications   tadalafil (CIALIS) 20 MG tablet    Sig: Take 1 tablet (20 mg total) by mouth daily as needed.    Dispense:  10 tablet    Refill:  2   lisinopril (ZESTRIL) 40 MG tablet    Sig: Take 1 tablet (40 mg total) by mouth daily.    Dispense:  90 tablet    Refill:  3   metFORMIN (GLUCOPHAGE-XR) 500 MG 24 hr tablet    Sig: Take 1 tablet (500 mg total) by mouth every morning.    Dispense:  90 tablet    Refill:  3    Patient Instructions  Medication Instructions:  Increase Lisinopril to 40 mg daily   *If you need a refill on your cardiac medications before your next appointment, please call your pharmacy*   Lab Work: BMET,  A1C today  LIPID in 1 month (come back fasting, nothing to eat or drink, no lab appointment needed)  If you have labs (blood work) drawn today and your tests are completely normal, you will receive your results only by: MyChart Message  (if you have MyChart) OR A paper copy in the mail If you have any lab test that is abnormal or we need to change your treatment, we will call you to review the results.   Testing/Procedures: Echocardiogram - Your physician has requested that you have an echocardiogram. Echocardiography is a painless test that uses sound waves to create images of your heart. It provides your doctor with information about the size and shape of your heart and how well your heart's chambers and valves are working. This procedure takes approximately one hour. There are no restrictions for this procedure.    Follow-Up: At [facility], you and your health needs are our priority.  As part of our continuing mission to provide you with exceptional heart care, we have created designated Provider Care Teams.  These Care Teams include your primary Cardiologist (physician) and Advanced Practice Providers (APPs -  Physician Assistants and Nurse Practitioners) who all work together to provide you with the care you need, when you need it.  We recommend signing up for the patient portal called "MyChart".  Sign up information is provided on this After Visit Summary.  MyChart is used to connect with patients for Virtual Visits (Telemedicine).  Patients are able to view lab/test results, encounter notes, upcoming appointments, etc.  Non-urgent messages can be sent to your provider as well.   To learn more about what you can do with MyChart, go to [email or web address].    Your next appointment:   3 month(s)  Provider:   Reatha Harps, MD     Other Instructions Check BP daily     Time Spent with Patient: I have spent a total of 35 minutes with patient reviewing hospital notes, telemetry, EKGs, labs and examining the patient as well as establishing an assessment and plan that was discussed with the patient.  > 50% of time was spent in direct patient care.  Signed, Lenna Gilford. Flora Lipps, MD, [facility]  [facility]  [address] [place], [place] [address] [phone]  11/15/2022 1:33 PM

## 2022-11-15 ENCOUNTER — Ambulatory Visit: Payer: Medicare Other | Attending: Cardiovascular Disease | Admitting: Cardiovascular Disease

## 2022-11-15 ENCOUNTER — Encounter: Payer: Self-pay | Admitting: Cardiovascular Disease

## 2022-11-15 VITALS — BP 178/76 | HR 72 | Ht 69.0 in | Wt 181.8 lb

## 2022-11-15 DIAGNOSIS — I10 Essential (primary) hypertension: Secondary | ICD-10-CM

## 2022-11-15 DIAGNOSIS — E119 Type 2 diabetes mellitus without complications: Secondary | ICD-10-CM

## 2022-11-15 DIAGNOSIS — Z720 Tobacco use: Secondary | ICD-10-CM

## 2022-11-15 DIAGNOSIS — Z7984 Long term (current) use of oral hypoglycemic drugs: Secondary | ICD-10-CM

## 2022-11-15 DIAGNOSIS — E785 Hyperlipidemia, unspecified: Secondary | ICD-10-CM

## 2022-11-15 DIAGNOSIS — F17210 Nicotine dependence, cigarettes, uncomplicated: Secondary | ICD-10-CM

## 2022-11-15 DIAGNOSIS — E1169 Type 2 diabetes mellitus with other specified complication: Secondary | ICD-10-CM | POA: Diagnosis not present

## 2022-11-15 DIAGNOSIS — I739 Peripheral vascular disease, unspecified: Secondary | ICD-10-CM | POA: Diagnosis not present

## 2022-11-15 DIAGNOSIS — I350 Nonrheumatic aortic (valve) stenosis: Secondary | ICD-10-CM | POA: Diagnosis not present

## 2022-11-15 MED ORDER — TADALAFIL 20 MG PO TABS: 20.0000 mg | ORAL_TABLET | Freq: Every day | ORAL | 2 refills | Status: AC | PRN

## 2022-11-15 MED ORDER — LISINOPRIL 40 MG PO TABS: 40.0000 mg | ORAL_TABLET | Freq: Every day | ORAL | 3 refills | Status: DC

## 2022-11-15 MED ORDER — METFORMIN HCL ER 500 MG PO TB24: 500.0000 mg | ORAL_TABLET | Freq: Every morning | ORAL | 3 refills | Status: DC

## 2022-11-15 NOTE — Patient Instructions (Addendum)
Medication Instructions:  Increase Lisinopril to 40 mg daily   *If you need a refill on your cardiac medications before your next appointment, please call your pharmacy*   Lab Work: BMET, A1C today  LIPID in 1 month (come back fasting, nothing to eat or drink, no lab appointment needed)  If you have labs (blood work) drawn today and your tests are completely normal, you will receive your results only by: MyChart Message (if you have MyChart) OR A paper copy in the mail If you have any lab test that is abnormal or we need to change your treatment, we will call you to review the results.   Testing/Procedures: Echocardiogram - Your physician has requested that you have an echocardiogram. Echocardiography is a painless test that uses sound waves to create images of your heart. It provides your doctor with information about the size and shape of your heart and how well your heart's chambers and valves are working. This procedure takes approximately one hour. There are no restrictions for this procedure.    Follow-Up: At [facility], you and your health needs are our priority.  As part of our continuing mission to provide you with exceptional heart care, we have created designated Provider Care Teams.  These Care Teams include your primary Cardiologist (physician) and Advanced Practice Providers (APPs -  Physician Assistants and Nurse Practitioners) who all work together to provide you with the care you need, when you need it.  We recommend signing up for the patient portal called "MyChart".  Sign up information is provided on this After Visit Summary.  MyChart is used to connect with patients for Virtual Visits (Telemedicine).  Patients are able to view lab/test results, encounter notes, upcoming appointments, etc.  Non-urgent messages can be sent to your provider as well.   To learn more about what you can do with MyChart, go to [email or web address].    Your next appointment:   3  month(s)  Provider:   Reatha Harps, MD     Other Instructions Check BP daily

## 2022-11-16 LAB — BASIC METABOLIC PANEL
BUN/Creatinine Ratio: 20 (ref 10–24)
BUN: 16 mg/dL (ref 8–27)
CO2: 21 mmol/L (ref 20–29)
Calcium: 9.3 mg/dL (ref 8.6–10.2)
Chloride: 105 mmol/L (ref 96–106)
Creatinine, Ser: 0.8 mg/dL (ref 0.76–1.27)
Glucose: 105 mg/dL — ABNORMAL HIGH (ref 70–99)
Potassium: 4.3 mmol/L (ref 3.5–5.2)
Sodium: 140 mmol/L (ref 134–144)
eGFR: 95 mL/min/{1.73_m2} (ref >59)

## 2022-11-16 LAB — HEMOGLOBIN A1C
Est. average glucose Bld gHb Est-mCnc: 126 mg/dL
Hgb A1c MFr Bld: 6 % — ABNORMAL HIGH (ref 4.8–5.6)

## 2022-12-11 ENCOUNTER — Ambulatory Visit (HOSPITAL_COMMUNITY): Payer: Medicare Other

## 2023-01-08 ENCOUNTER — Encounter (HOSPITAL_COMMUNITY): Payer: Self-pay | Admitting: Cardiovascular Disease

## 2023-01-08 ENCOUNTER — Ambulatory Visit (HOSPITAL_COMMUNITY): Payer: Medicare Other | Attending: Cardiovascular Disease

## 2023-01-09 ENCOUNTER — Ambulatory Visit (HOSPITAL_COMMUNITY): Payer: Medicare Other | Attending: Cardiovascular Disease

## 2023-01-09 DIAGNOSIS — I350 Nonrheumatic aortic (valve) stenosis: Secondary | ICD-10-CM

## 2023-01-09 LAB — ECHOCARDIOGRAM COMPLETE
AR max vel: 1.6 cm2
AV Area VTI: 1.58 cm2
AV Area mean vel: 1.52 cm2
AV Mean grad: 15.2 mmHg
AV Peak grad: 26.7 mmHg
Ao pk vel: 2.58 m/s
Area-P 1/2: 2.69 cm2
S' Lateral: 2.5 cm

## 2023-02-03 ENCOUNTER — Other Ambulatory Visit: Payer: Self-pay | Admitting: Cardiovascular Disease

## 2023-02-03 DIAGNOSIS — I10 Essential (primary) hypertension: Secondary | ICD-10-CM

## 2023-02-05 MED ORDER — LISINOPRIL 40 MG PO TABS: 40.0000 mg | ORAL_TABLET | Freq: Every day | ORAL | 2 refills | Status: AC

## 2023-02-21 ENCOUNTER — Other Ambulatory Visit: Payer: Self-pay | Admitting: Cardiovascular Disease

## 2023-02-23 ENCOUNTER — Other Ambulatory Visit: Payer: Self-pay | Admitting: Cardiovascular Disease

## 2023-04-01 NOTE — Progress Notes (Unsigned)
  Cardiology Office Note:  .   Date:  04/02/2023  ID:  Adam Perkins, DOB Jun 13, 1951, MRN [id number] PCP: Aviva Kluver  [facility] Providers Cardiologist:  Reatha Harps, MD {   History of Present Illness: .   Adam Perkins is a 72 y.o. male with history of DM, HTN, HLD, PAD, tobacco abuse, AS who presents for follow-up.   Discussed the use of AI scribe software for clinical note transcription with the patient, who gave verbal consent to proceed.  History of Present Illness   The patient, with a history of peripheral artery disease (PAD) status post stent placement in the right common iliac artery, diabetes, hypertension, and aortic stenosis, presents for a routine follow-up. The patient reports feeling well overall. He had been prescribed Zetia for cholesterol management, but discontinued it due to intolerance, specifically loose bowel movements. The patient also mentions that he continues to smoke occasionally, 1.5 ppd. He reports no CP SOB or claudication. LDL still not at goal.          Problem List 1. Diabetes  -A1c 6.0 2. Hypertension 3. Hyperlipidemia  -T chol 182, HDL 36, LDL 108, TG 101 4. PAD  -s/p stent to occluded R common iliac artery 08/11/2019 -50-99% 10/2022 5. Tobacco abuse (25 pack-years) 6. Aortic stenosis -mild 01/09/2023    ROS: All other ROS reviewed and negative. Pertinent positives noted in the HPI.     Studies Reviewed: Marland Kitchen       Physical Exam:   VS:  BP 124/86 (BP Location: Left Arm, Patient Position: Sitting)   Pulse 62   Ht 5\' 9"  (1.753 m)   Wt 183 lb 12.8 oz (83.4 kg)   SpO2 98%   BMI 27.14 kg/m    Wt Readings from Last 3 Encounters:  04/02/23 183 lb 12.8 oz (83.4 kg)  11/15/22 181 lb 12.8 oz (82.5 kg)  10/11/22 187 lb (84.8 kg)    GEN: Well nourished, well developed in no acute distress NECK: No JVD; No carotid bruits CARDIAC: RRR, no murmurs, rubs, gallops RESPIRATORY:  Clear to auscultation without rales, wheezing or rhonchi   ABDOMEN: Soft, non-tender, non-distended EXTREMITIES:  No edema; No deformity  ASSESSMENT AND PLAN: .    Assessment and Plan    Peripheral Artery Disease Status post stent placement to the right common iliac artery with 50-99% residual stenosis. Cholesterol not at goal. Zetia discontinued due to intolerance. No symptoms of claudication. -Continue Crestor 40mg  daily. -Consult with pharmacy to discuss PCSK9 inhibitor therapy.  Hypertension Well controlled. -Continue Lisinopril 40mg  daily.  Diabetes Mellitus Well controlled with an A1C of 6.0. -Continue current regimen.  Mild Aortic Stenosis No symptoms of chest pain or trouble breathing. -Plan to repeat echocardiogram in 3-5 years.  Tobacco Use Reports occasional smoking. -Encouraged to quit smoking. -3 min smoking cessation counseling provided today  Follow-up in 1 year.              Follow-up: Return in about 1 year (around 04/01/2024).  Time Spent with Patient: I have spent a total of 25 minutes with patient reviewing hospital notes, telemetry, EKGs, labs and examining the patient as well as establishing an assessment and plan that was discussed with the patient.  > 50% of time was spent in direct patient care.  Signed, Lenna Gilford. Flora Lipps, MD, [facility]  [facility]  [address], [address] [place], [place] [address] [phone]  10:05 AM

## 2023-04-02 ENCOUNTER — Encounter: Payer: Self-pay | Admitting: Cardiovascular Disease

## 2023-04-02 ENCOUNTER — Ambulatory Visit: Payer: Medicare Other | Attending: Cardiovascular Disease | Admitting: Cardiovascular Disease

## 2023-04-02 VITALS — BP 124/86 | HR 62 | Ht 69.0 in | Wt 183.8 lb

## 2023-04-02 DIAGNOSIS — F17210 Nicotine dependence, cigarettes, uncomplicated: Secondary | ICD-10-CM | POA: Diagnosis not present

## 2023-04-02 DIAGNOSIS — I150 Renovascular hypertension: Secondary | ICD-10-CM

## 2023-04-02 DIAGNOSIS — F17200 Nicotine dependence, unspecified, uncomplicated: Secondary | ICD-10-CM | POA: Diagnosis not present

## 2023-04-02 DIAGNOSIS — E1169 Type 2 diabetes mellitus with other specified complication: Secondary | ICD-10-CM | POA: Diagnosis not present

## 2023-04-02 DIAGNOSIS — I739 Peripheral vascular disease, unspecified: Secondary | ICD-10-CM | POA: Diagnosis not present

## 2023-04-02 DIAGNOSIS — Z720 Tobacco use: Secondary | ICD-10-CM | POA: Diagnosis not present

## 2023-04-02 DIAGNOSIS — I350 Nonrheumatic aortic (valve) stenosis: Secondary | ICD-10-CM

## 2023-04-02 DIAGNOSIS — E785 Hyperlipidemia, unspecified: Secondary | ICD-10-CM

## 2023-04-02 NOTE — Patient Instructions (Signed)
Medication Instructions:  - stop zetia 10mg    *If you need a refill on your cardiac medications before your next appointment, please call your pharmacy*   Lab Work: NONE  If you have labs (blood work) drawn today and your tests are completely normal, you will receive your results only by: MyChart Message (if you have MyChart) OR A paper copy in the mail If you have any lab test that is abnormal or we need to change your treatment, we will call you to review the results.   Testing/Procedures: NONE   Follow-Up: At [facility], you and your health needs are our priority.  As part of our continuing mission to provide you with exceptional heart care, we have created designated Provider Care Teams.  These Care Teams include your primary Cardiologist (physician) and Advanced Practice Providers (APPs -  Physician Assistants and Nurse Practitioners) who all work together to provide you with the care you need, when you need it.  We recommend signing up for the patient portal called "MyChart".  Sign up information is provided on this After Visit Summary.  MyChart is used to connect with patients for Virtual Visits (Telemedicine).  Patients are able to view lab/test results, encounter notes, upcoming appointments, etc.  Non-urgent messages can be sent to your provider as well.   To learn more about what you can do with MyChart, go to [email or web address].    Your next appointment:   1 year(s)  The format for your next appointment:   In Person  Provider:   Reatha Harps, MD    Other Instructions Dr. Scharlene Gloss has referred you to the Hypertension Clinic at [facility].

## 2023-05-22 ENCOUNTER — Ambulatory Visit: Payer: Medicare Other

## 2023-06-25 ENCOUNTER — Encounter: Payer: Self-pay | Admitting: Pharmacist Clinician (PhC)/ Clinical Pharmacy Specialist

## 2023-06-25 ENCOUNTER — Ambulatory Visit: Payer: Medicare Other | Attending: Cardiology | Admitting: Pharmacist Clinician (PhC)/ Clinical Pharmacy Specialist

## 2023-06-25 DIAGNOSIS — E785 Hyperlipidemia, unspecified: Secondary | ICD-10-CM

## 2023-06-25 DIAGNOSIS — E1169 Type 2 diabetes mellitus with other specified complication: Secondary | ICD-10-CM

## 2023-06-25 LAB — LAB REPORT - SCANNED
A1c: 6.1
EGFR: 110.9

## 2023-06-25 NOTE — Assessment & Plan Note (Signed)
 Assessment: Patient with ASCVD not at LDL goal of < 55 Most recent LDL 108 on 10/11/22 Has been compliant with high intensity statin : rosuvastatin  40 mg daily  Had labs drawn with new PCP today Quinten Tisovec) - will check [facility] in 2-3 days to see if lipid labs were included Reviewed options for lowering LDL cholesterol, including ezetimibe , PCSK-9 inhibitors, bempedoic acid and inclisiran.  Discussed mechanisms of action, dosing, side effects, potential decreases in LDL cholesterol and costs.  Also reviewed potential options for patient assistance.  Plan: Patient agreeable to starting Repatha  Repeat labs after: 3 months  Lipid Liver function

## 2023-06-25 NOTE — Progress Notes (Signed)
 Office Visit    Patient Name: Adam Perkins Date of Encounter: 06/25/2023  Primary Care Provider:  Pcp, No Primary Cardiologist:  Darryle ONEIDA Decent, MD  Chief Complaint    Hyperlipidemia   Significant Past Medical History   PAD S/P stent to right common iliac with 50-99% residual stenosis  HTN Controlled on lisinopril  40 mg daily  DM2 5/24 A1c 6, on metformin  500 every day,   Tobacco abuse Currently at ~ 1/2 ppd, trying to quit        No Known Allergies  History of Present Illness    Adam Perkins is a 73 y.o. male patient of Dr Decent, in the office today to discuss options for cholesterol management.  His last cholesterol labs were drawn in April, however he just had blood work drawn by new PCP, Charlie Reas MD, so will wait a few days to check [facility] for updated labs.  Pharmacy:    [facility], [address]  (uses Clinical Cytogeneticist)  Insurance Carrier:  [employer] [phone]  LDL Cholesterol goal:  LDL < 55  Current Medications:  rosuvastatin  40 mg qd   Previously tried:  ezetimibe  - loose stools  Family Hx:   no heart disease in family   Social Hx: Tobacco: less than 1 ppd, trying , using gum, lozenges Alcohol:  wine  Diet:  mostly home cooked meals; mix of f/v, mix of proteins;  enjoys cooking and feels as though diet is mostly healthy  Exercise: travelling lateely makes it hard  - gym in [place] in [facility] when he's there   Accessory Clinical Findings   Lab Results  Component Value Date   CHOL 182 10/11/2022   HDL 56 10/11/2022   LDLCALC 108 (H) 10/11/2022   LDLDIRECT 220.0 06/17/2019   TRIG 101 10/11/2022   CHOLHDL 3.3 10/11/2022    No results found for: LIPOA  Lab Results  Component Value Date   ALT 44 10/11/2022   AST 29 10/11/2022   ALKPHOS 38 (L) 10/11/2022   BILITOT 0.5 10/11/2022   Lab Results  Component Value Date   CREATININE 0.80 11/15/2022   BUN 16 11/15/2022   NA 140 11/15/2022   K 4.3 11/15/2022   CL 105 11/15/2022   CO2 21 11/15/2022   Lab  Results  Component Value Date   HGBA1C 6.0 (H) 11/15/2022    Home Medications    Current Outpatient Medications  Medication Sig Dispense Refill   Ascorbic Acid (VITAMIN C) 1000 MG tablet Take 100 mg by mouth daily.     aspirin  EC 81 MG tablet Take 81 mg by mouth daily.     lisinopril  (ZESTRIL ) 20 MG tablet TAKE 1 TABLET BY MOUTH ONCE DAILY 30 tablet 11   lisinopril  (ZESTRIL ) 40 MG tablet Take 1 tablet (40 mg total) by mouth daily. 90 tablet 2   metFORMIN  (GLUCOPHAGE -XR) 500 MG 24 hr tablet Take 1 tablet (500 mg total) by mouth every morning. 90 tablet 3   rosuvastatin  (CRESTOR ) 40 MG tablet Take 1 tablet by mouth once daily. 90 tablet 2   selenium  50 MCG TABS tablet Take 50 mcg by mouth daily.     tadalafil  (CIALIS ) 20 MG tablet Take 1 tablet (20 mg total) by mouth daily as needed. 10 tablet 2   timolol  (TIMOPTIC ) 0.5 % ophthalmic solution Place 1 drop into both eyes daily.      timolol  (TIMOPTIC ) 0.5 % ophthalmic solution Place 1 drop into both eyes in the morning. 15 mL  3   vitamin E 200 UNIT capsule Take 200 Units by mouth daily.     No current facility-administered medications for this visit.     Assessment & Plan    Hyperlipidemia associated with type 2 diabetes mellitus (HCC) Assessment: Patient with ASCVD not at LDL goal of < 55 Most recent LDL 108 on 10/11/22 Has been compliant with high intensity statin : rosuvastatin  40 mg daily  Had labs drawn with new PCP today Quinten Tisovec) - will check [facility] in 2-3 days to see if lipid labs were included Reviewed options for lowering LDL cholesterol, including ezetimibe , PCSK-9 inhibitors, bempedoic acid and inclisiran.  Discussed mechanisms of action, dosing, side effects, potential decreases in LDL cholesterol and costs.  Also reviewed potential options for patient assistance.  Plan: Patient agreeable to starting Repatha  Repeat labs after: 3 months  Lipid Liver function   Allean Mink, PharmD CPP [facility] [address]  [address]  [place], [place] [address] [phone]  06/25/2023, 4:30 PM

## 2023-06-25 NOTE — Patient Instructions (Signed)
 Your Results:             Your most recent labs Goal  Total Cholesterol 182 < 200  Triglycerides 101 < 150  HDL (happy/good cholesterol) 56 > 40  LDL (lousy/bad cholesterol 108 < 55   Medication changes:  We will start the process to get Repatha  covered by your insurance.  Once the prior authorization is complete, I will call/send a MyChart message to let you know and confirm pharmacy information.   You will take one injection every 14 days.    Lab orders:  We want to repeat labs after 2-3 months.  We will send you a lab order to remind you once we get closer to that time.      Thank you for choosing [facility]

## 2023-07-03 ENCOUNTER — Telehealth: Payer: Self-pay | Admitting: Pharmacist Clinician (PhC)/ Clinical Pharmacy Specialist

## 2023-07-03 NOTE — Telephone Encounter (Signed)
 Please do PA for Repatha.  Labs drawn 06/25/23 in [facility].    Total cholesterol - 173 Triglycerides - 144 HDL - 55 LDL - 89

## 2023-07-04 ENCOUNTER — Other Ambulatory Visit (HOSPITAL_COMMUNITY): Payer: Self-pay

## 2023-07-04 ENCOUNTER — Telehealth: Payer: Self-pay | Admitting: Pharmacy Technician

## 2023-07-04 NOTE — Telephone Encounter (Signed)
 Pharmacy Patient Advocate Encounter  Received notification from [employer] that Prior Authorization for repatha  has been APPROVED from 07/04/23 to 01/01/24. Ran test claim, Copay is $387.00 one month (DEDUCTIBLE). This test claim was processed through [facility]- copay amounts may vary at other pharmacies due to pharmacy/plan contracts, or as the patient moves through the different stages of their insurance plan.   PA #/Case ID/Reference #: [id number]

## 2023-07-04 NOTE — Telephone Encounter (Signed)
 Pharmacy Patient Advocate Encounter   Received notification from Pt Calls Messages that prior authorization for repatha  is required/requested.   Insurance verification completed.   The patient is insured through [facility] .   Per test claim: PA required; PA submitted to above mentioned insurance via CoverMyMeds Key/confirmation #/EOC [id number] Status is pending

## 2023-07-05 ENCOUNTER — Other Ambulatory Visit (HOSPITAL_COMMUNITY): Payer: Self-pay

## 2023-07-05 MED ORDER — REPATHA SURECLICK 140 MG/ML ~~LOC~~ SOAJ
140.0000 mg | SUBCUTANEOUS | 3 refills | Status: AC
  Filled 2023-07-05: qty 2, 28d supply, fill #0
  Filled 2023-08-07: qty 6, 84d supply, fill #1
  Filled 2023-11-22: qty 6, 84d supply, fill #2
  Filled 2024-02-05: qty 6, 84d supply, fill #3
  Filled 2024-05-29: qty 4, 56d supply, fill #4

## 2023-07-05 NOTE — Addendum Note (Signed)
Addended by: Rosalee Kaufman on: 07/05/2023 10:59 AM   Modules accepted: Orders

## 2023-07-10 ENCOUNTER — Other Ambulatory Visit (HOSPITAL_COMMUNITY): Payer: Self-pay

## 2023-07-10 MED ORDER — TIMOLOL MALEATE 0.5 % OP SOLN
1.0000 [drp] | Freq: Every morning | OPHTHALMIC | 2 refills | Status: AC
  Filled 2023-07-10: qty 5, 30d supply, fill #0
  Filled 2023-07-13: qty 10, 90d supply, fill #0
  Filled 2024-05-09: qty 10, 100d supply, fill #1

## 2023-07-13 ENCOUNTER — Other Ambulatory Visit (HOSPITAL_COMMUNITY): Payer: Self-pay

## 2023-07-24 ENCOUNTER — Other Ambulatory Visit (HOSPITAL_COMMUNITY): Payer: Self-pay

## 2023-08-07 ENCOUNTER — Other Ambulatory Visit (HOSPITAL_COMMUNITY): Payer: Self-pay

## 2023-08-08 ENCOUNTER — Other Ambulatory Visit (HOSPITAL_COMMUNITY): Payer: Self-pay

## 2023-09-26 ENCOUNTER — Telehealth: Payer: Self-pay | Admitting: Pharmacist Clinician (PhC)/ Clinical Pharmacy Specialist

## 2023-09-26 ENCOUNTER — Other Ambulatory Visit: Payer: Self-pay | Admitting: Cardiovascular Disease

## 2023-09-26 ENCOUNTER — Other Ambulatory Visit: Payer: Self-pay | Admitting: Pharmacist Clinician (PhC)/ Clinical Pharmacy Specialist

## 2023-09-26 DIAGNOSIS — E1169 Type 2 diabetes mellitus with other specified complication: Secondary | ICD-10-CM

## 2023-09-26 NOTE — Telephone Encounter (Signed)
 3 month Repatha labs ordered

## 2023-10-23 ENCOUNTER — Encounter: Payer: Self-pay | Admitting: Cardiovascular Disease

## 2023-10-23 LAB — LIPID PANEL
Chol/HDL Ratio: 1.8 ratio (ref 0.0–5.0)
Cholesterol, Total: 86 mg/dL — ABNORMAL LOW (ref 100–199)
HDL: 47 mg/dL (ref >39)
LDL Chol Calc (NIH): 23 mg/dL (ref 0–99)
Triglycerides: 75 mg/dL (ref 0–149)
VLDL Cholesterol Cal: 16 mg/dL (ref 5–40)

## 2023-10-31 ENCOUNTER — Ambulatory Visit: Payer: Self-pay

## 2023-11-14 ENCOUNTER — Other Ambulatory Visit: Payer: Self-pay | Admitting: Cardiovascular Disease

## 2023-11-14 DIAGNOSIS — E119 Type 2 diabetes mellitus without complications: Secondary | ICD-10-CM

## 2023-11-22 ENCOUNTER — Other Ambulatory Visit (HOSPITAL_COMMUNITY): Payer: Self-pay

## 2024-02-05 ENCOUNTER — Other Ambulatory Visit (HOSPITAL_COMMUNITY): Payer: Self-pay

## 2024-02-05 ENCOUNTER — Telehealth: Payer: Self-pay | Admitting: Pharmacy Technician

## 2024-02-05 NOTE — Telephone Encounter (Signed)
 Pharmacy Patient Advocate Encounter  Received notification from [facility] that Prior Authorization for Repatha  has been APPROVED from 02/05/24 to 06/18/24   PA #/Case ID/Reference #: [id number]  Notified pharmacy

## 2024-02-05 NOTE — Telephone Encounter (Signed)
 Pharmacy Patient Advocate Encounter   Received notification from Patient Pharmacy that prior authorization for repatha  is required/requested.   Insurance verification completed.   The patient is insured through [employer] .   Per test claim: PA required; PA submitted to above mentioned insurance via Latent Key/confirmation #/EOC [id number] Status is pending

## 2024-02-26 ENCOUNTER — Other Ambulatory Visit: Payer: Self-pay | Admitting: Cardiovascular Disease

## 2024-03-25 ENCOUNTER — Other Ambulatory Visit: Payer: Self-pay | Admitting: Cardiovascular Disease

## 2024-04-23 ENCOUNTER — Other Ambulatory Visit: Payer: Self-pay | Admitting: Cardiovascular Disease

## 2024-05-09 ENCOUNTER — Other Ambulatory Visit (HOSPITAL_COMMUNITY): Payer: Self-pay

## 2024-05-23 ENCOUNTER — Other Ambulatory Visit: Payer: Self-pay | Admitting: Cardiovascular Disease

## 2024-05-26 ENCOUNTER — Other Ambulatory Visit (HOSPITAL_COMMUNITY): Payer: Self-pay

## 2024-05-29 ENCOUNTER — Other Ambulatory Visit (HOSPITAL_COMMUNITY): Payer: Self-pay

## 2024-06-25 ENCOUNTER — Other Ambulatory Visit: Payer: Self-pay | Admitting: Cardiovascular Disease

## 2024-07-24 ENCOUNTER — Other Ambulatory Visit: Payer: Self-pay | Admitting: Cardiovascular Disease
